# Patient Record
Sex: Female | Born: 1950
Health system: Southern US, Community
[De-identification: ages and names within clinical notes are randomized; demographics above are authoritative.]

## PROBLEM LIST (undated history)

## (undated) DIAGNOSIS — I10 Essential (primary) hypertension: Secondary | ICD-10-CM

## (undated) DIAGNOSIS — E785 Hyperlipidemia, unspecified: Secondary | ICD-10-CM

## (undated) DIAGNOSIS — F32A Depression, unspecified: Secondary | ICD-10-CM

## (undated) DIAGNOSIS — T7840XA Allergy, unspecified, initial encounter: Secondary | ICD-10-CM

## (undated) DIAGNOSIS — F329 Major depressive disorder, single episode, unspecified: Secondary | ICD-10-CM

## (undated) HISTORY — PX: BREAST CYST ASPIRATION: SHX578

## (undated) HISTORY — DX: Hyperlipidemia, unspecified: E78.5

## (undated) HISTORY — DX: Depression, unspecified: F32.A

## (undated) HISTORY — DX: Allergy, unspecified, initial encounter: T78.40XA

## (undated) HISTORY — DX: Major depressive disorder, single episode, unspecified: F32.9

## (undated) HISTORY — DX: Essential (primary) hypertension: I10

---

## 2005-08-27 ENCOUNTER — Ambulatory Visit: Payer: Self-pay | Admitting: Family Medicine

## 2005-12-23 ENCOUNTER — Ambulatory Visit: Payer: Self-pay | Admitting: Family Medicine

## 2006-09-13 HISTORY — PX: PLANTAR FASCIA SURGERY: SHX746

## 2007-01-30 ENCOUNTER — Ambulatory Visit: Payer: Self-pay | Admitting: Family Medicine

## 2008-08-07 ENCOUNTER — Ambulatory Visit: Payer: Self-pay | Admitting: Unknown Physician Specialty

## 2008-08-07 LAB — HM COLONOSCOPY

## 2010-05-08 ENCOUNTER — Other Ambulatory Visit: Payer: Self-pay | Admitting: Family Medicine

## 2010-06-25 ENCOUNTER — Ambulatory Visit: Payer: Self-pay | Admitting: Family Medicine

## 2011-08-24 ENCOUNTER — Encounter: Payer: Self-pay | Admitting: Family Medicine

## 2011-09-14 ENCOUNTER — Encounter: Payer: Self-pay | Admitting: Family Medicine

## 2011-09-20 ENCOUNTER — Ambulatory Visit: Payer: Self-pay | Admitting: Family Medicine

## 2011-10-15 ENCOUNTER — Encounter: Payer: Self-pay | Admitting: Family Medicine

## 2012-01-28 ENCOUNTER — Other Ambulatory Visit: Payer: Self-pay | Admitting: Psychiatry

## 2012-01-28 LAB — SGOT (AST)(ARMC): SGOT(AST): 22 U/L (ref 15–37)

## 2012-01-28 LAB — ELECTROLYTE PANEL
Anion Gap: 9 (ref 7–16)
Chloride: 101 mmol/L (ref 98–107)
Co2: 28 mmol/L (ref 21–32)
Potassium: 3.9 mmol/L (ref 3.5–5.1)

## 2012-01-28 LAB — T4, FREE: Free Thyroxine: 1.21 ng/dL (ref 0.76–1.46)

## 2012-01-28 LAB — TSH: Thyroid Stimulating Horm: 0.745 u[IU]/mL

## 2012-01-28 LAB — CBC WITH DIFFERENTIAL/PLATELET
Basophil #: 0 10*3/uL (ref 0.0–0.1)
Basophil %: 0.4 %
Eosinophil %: 1.7 %
HCT: 39.4 % (ref 35.0–47.0)
HGB: 13.2 g/dL (ref 12.0–16.0)
Lymphocyte #: 1.8 10*3/uL (ref 1.0–3.6)
MCH: 30.4 pg (ref 26.0–34.0)
Monocyte %: 6.1 %
Neutrophil %: 73.4 %
Platelet: 230 10*3/uL (ref 150–440)
RBC: 4.35 10*6/uL (ref 3.80–5.20)
RDW: 14 % (ref 11.5–14.5)

## 2012-01-28 LAB — CREATININE, SERUM: EGFR (African American): >60

## 2012-03-23 ENCOUNTER — Other Ambulatory Visit: Payer: Self-pay | Admitting: Psychiatry

## 2012-03-23 LAB — BASIC METABOLIC PANEL
Anion Gap: 9 (ref 7–16)
BUN: 18 mg/dL (ref 7–18)
Co2: 27 mmol/L (ref 21–32)
Creatinine: 0.74 mg/dL (ref 0.60–1.30)
EGFR (African American): >60
EGFR (Non-African Amer.): >60
Glucose: 100 mg/dL — ABNORMAL HIGH (ref 65–99)

## 2012-03-23 LAB — CBC
HCT: 38.7 % (ref 35.0–47.0)
HGB: 12.8 g/dL (ref 12.0–16.0)
MCH: 30.1 pg (ref 26.0–34.0)
Platelet: 233 10*3/uL (ref 150–440)
RBC: 4.25 10*6/uL (ref 3.80–5.20)
WBC: 9.9 10*3/uL (ref 3.6–11.0)

## 2012-03-23 LAB — FERRITIN: Ferritin (ARMC): 19 ng/mL (ref 8–388)

## 2012-03-23 LAB — ALT: SGPT (ALT): 30 U/L

## 2012-03-23 LAB — IRON: Iron: 74 ug/dL (ref 50–170)

## 2012-03-23 LAB — SGOT (AST)(ARMC): SGOT(AST): 19 U/L (ref 15–37)

## 2012-09-08 ENCOUNTER — Ambulatory Visit: Payer: Self-pay | Admitting: Psychiatry

## 2012-10-10 ENCOUNTER — Other Ambulatory Visit: Payer: Self-pay | Admitting: Family Medicine

## 2012-10-10 LAB — CBC WITH DIFFERENTIAL/PLATELET
Eosinophil #: 0.3 10*3/uL (ref 0.0–0.7)
Eosinophil %: 2.9 %
HCT: 40.9 % (ref 35.0–47.0)
HGB: 13.4 g/dL (ref 12.0–16.0)
MCHC: 32.8 g/dL (ref 32.0–36.0)
MCV: 90 fL (ref 80–100)
Monocyte %: 6.7 %
Neutrophil #: 6.4 10*3/uL (ref 1.4–6.5)
Platelet: 228 10*3/uL (ref 150–440)
RBC: 4.54 10*6/uL (ref 3.80–5.20)
RDW: 14 % (ref 11.5–14.5)
WBC: 9 10*3/uL (ref 3.6–11.0)

## 2012-10-10 LAB — COMPREHENSIVE METABOLIC PANEL
Alkaline Phosphatase: 117 U/L (ref 50–136)
Anion Gap: 10 (ref 7–16)
BUN: 21 mg/dL — ABNORMAL HIGH (ref 7–18)
Calcium, Total: 9.6 mg/dL (ref 8.5–10.1)
Creatinine: 0.79 mg/dL (ref 0.60–1.30)
EGFR (Non-African Amer.): >60
Glucose: 94 mg/dL (ref 65–99)
Osmolality: 278 (ref 275–301)
Potassium: 4.1 mmol/L (ref 3.5–5.1)
SGOT(AST): 26 U/L (ref 15–37)
SGPT (ALT): 30 U/L (ref 12–78)
Sodium: 138 mmol/L (ref 136–145)

## 2012-10-10 LAB — TSH: Thyroid Stimulating Horm: 0.674 u[IU]/mL

## 2012-10-10 LAB — CK: CK, Total: 66 U/L (ref 21–215)

## 2012-10-13 ENCOUNTER — Ambulatory Visit: Payer: Self-pay | Admitting: Psychiatry

## 2014-06-04 ENCOUNTER — Ambulatory Visit: Payer: Self-pay | Admitting: Family Medicine

## 2014-06-04 LAB — HM MAMMOGRAPHY

## 2014-08-28 LAB — TSH: TSH: 0.75 u[IU]/mL (ref 0.41–5.90)

## 2014-08-28 LAB — LIPID PANEL
Cholesterol: 161 mg/dL (ref 0–200)
HDL: 49 mg/dL (ref 35–70)
LDL Cholesterol: 86 mg/dL
Triglycerides: 131 mg/dL (ref 40–160)

## 2014-08-28 LAB — CBC AND DIFFERENTIAL
HCT: 42 % (ref 36–46)
Hemoglobin: 13.8 g/dL (ref 12.0–16.0)
PLATELETS: 259 10*3/uL (ref 150–399)
WBC: 8.8 10^3/mL

## 2014-08-28 LAB — BASIC METABOLIC PANEL
BUN: 15 mg/dL (ref 4–21)
CREATININE: 0.8 mg/dL (ref 0.5–1.1)
GLUCOSE: 106 mg/dL
Potassium: 4.6 mmol/L (ref 3.4–5.3)
SODIUM: 140 mmol/L (ref 137–147)

## 2014-08-28 LAB — HM PAP SMEAR: HM PAP: NEGATIVE

## 2014-08-28 LAB — HEPATIC FUNCTION PANEL
ALT: 23 U/L (ref 7–35)
AST: 21 U/L (ref 13–35)

## 2015-02-13 ENCOUNTER — Encounter: Payer: Self-pay | Admitting: Family Medicine

## 2015-02-13 ENCOUNTER — Ambulatory Visit (INDEPENDENT_AMBULATORY_CARE_PROVIDER_SITE_OTHER): Payer: BLUE CROSS/BLUE SHIELD | Admitting: Family Medicine

## 2015-02-13 VITALS — BP 128/74 | HR 72 | Temp 98.2°F | Resp 16 | Ht 62.0 in | Wt 245.0 lb

## 2015-02-13 DIAGNOSIS — R350 Frequency of micturition: Secondary | ICD-10-CM | POA: Insufficient documentation

## 2015-02-13 DIAGNOSIS — E669 Obesity, unspecified: Secondary | ICD-10-CM | POA: Insufficient documentation

## 2015-02-13 DIAGNOSIS — K649 Unspecified hemorrhoids: Secondary | ICD-10-CM | POA: Insufficient documentation

## 2015-02-13 DIAGNOSIS — J0100 Acute maxillary sinusitis, unspecified: Secondary | ICD-10-CM | POA: Diagnosis not present

## 2015-02-13 DIAGNOSIS — R059 Cough, unspecified: Secondary | ICD-10-CM

## 2015-02-13 DIAGNOSIS — I129 Hypertensive chronic kidney disease with stage 1 through stage 4 chronic kidney disease, or unspecified chronic kidney disease: Secondary | ICD-10-CM | POA: Insufficient documentation

## 2015-02-13 DIAGNOSIS — F325 Major depressive disorder, single episode, in full remission: Secondary | ICD-10-CM | POA: Insufficient documentation

## 2015-02-13 DIAGNOSIS — R05 Cough: Secondary | ICD-10-CM | POA: Diagnosis not present

## 2015-02-13 DIAGNOSIS — G4730 Sleep apnea, unspecified: Secondary | ICD-10-CM | POA: Insufficient documentation

## 2015-02-13 DIAGNOSIS — Z124 Encounter for screening for malignant neoplasm of cervix: Secondary | ICD-10-CM | POA: Insufficient documentation

## 2015-02-13 DIAGNOSIS — S46819A Strain of other muscles, fascia and tendons at shoulder and upper arm level, unspecified arm, initial encounter: Secondary | ICD-10-CM | POA: Insufficient documentation

## 2015-02-13 DIAGNOSIS — E7800 Pure hypercholesterolemia, unspecified: Secondary | ICD-10-CM | POA: Insufficient documentation

## 2015-02-13 DIAGNOSIS — B379 Candidiasis, unspecified: Secondary | ICD-10-CM | POA: Insufficient documentation

## 2015-02-13 DIAGNOSIS — E559 Vitamin D deficiency, unspecified: Secondary | ICD-10-CM | POA: Insufficient documentation

## 2015-02-13 DIAGNOSIS — M5430 Sciatica, unspecified side: Secondary | ICD-10-CM | POA: Insufficient documentation

## 2015-02-13 DIAGNOSIS — J309 Allergic rhinitis, unspecified: Secondary | ICD-10-CM | POA: Insufficient documentation

## 2015-02-13 MED ORDER — AMOXICILLIN-POT CLAVULANATE 875-125 MG PO TABS: 1.0000 | ORAL_TABLET | Freq: Two times a day (BID) | ORAL | Status: DC

## 2015-02-13 MED ORDER — FLUCONAZOLE 150 MG PO TABS: 150.0000 mg | ORAL_TABLET | Freq: Once | ORAL | Status: DC

## 2015-02-13 MED ORDER — FLUTICASONE PROPIONATE 50 MCG/ACT NA SUSP: 2.0000 | Freq: Every day | NASAL | Status: DC

## 2015-02-13 NOTE — Progress Notes (Signed)
   Subjective:    Patient ID: Anne Bell, female    DOB: 1951-04-24, 64 y.o.   MRN: [id number]  Cough This is a new problem. The current episode started in the past 7 days. The problem has been gradually worsening. The cough is productive of sputum. Associated symptoms include chills (Monday or Tuesday. ), nasal congestion and a sore throat. Pertinent negatives include no chest pain, ear congestion, ear pain, fever, headaches, postnasal drip, shortness of breath or wheezing. Eye redness: Productive, except at night. Delsym helps some.   The symptoms are aggravated by other. She has tried OTC cough suppressant for the symptoms. The treatment provided no relief.   Had symptoms early May for 2 weeks, then improved and now recurred over weekend.     Review of Systems  Constitutional: Positive for chills (Monday or Tuesday. ). Negative for fever.  HENT: Positive for sore throat. Negative for ear pain and postnasal drip.   Eyes: Eye redness: Productive, except at night. Delsym helps some.    Respiratory: Positive for cough. Negative for shortness of breath and wheezing.   Cardiovascular: Negative for chest pain.  Neurological: Negative for headaches.   Past Medical History  Diagnosis Date  . Allergy   . Hyperlipidemia   . Hypertension   . Depression    Past Surgical History  Procedure Laterality Date  . Plantar fascia surgery Left 2008    reports that she has never smoked. She has never used smokeless tobacco. She reports that she drinks alcohol. She reports that she does not use illicit drugs. family history includes Alcohol abuse in her father. Allergies  Allergen Reactions  . Celecoxib     stomach cramps  . Other   . Prednisone Other (See Comments)    No current outpatient prescriptions on file prior to visit.   No current facility-administered medications on file prior to visit.    Objective:   Physical Exam  Constitutional: She is oriented to person, place, and time. She  appears well-developed and well-nourished.  HENT:  Head: Normocephalic and atraumatic.  Right Ear: Tympanic membrane and external ear normal.  Left Ear: Tympanic membrane and external ear normal.  Nose: Mucosal edema and rhinorrhea present. Right sinus exhibits no maxillary sinus tenderness. Left sinus exhibits maxillary sinus tenderness.  Mouth/Throat: Uvula is midline. No oropharyngeal exudate.  Eyes: Conjunctivae and EOM are normal. Pupils are equal, round, and reactive to light.  Neck: Normal range of motion. Neck supple.  Cardiovascular: Normal rate and regular rhythm.   Pulmonary/Chest: Effort normal and breath sounds normal. She has no wheezes. She has no rales.  Neurological: She is alert and oriented to person, place, and time.   BP 128/74 mmHg  Pulse 72  Temp(Src) 98.2 F (36.8 C) (Oral)  Resp 16  Ht 5\' 2"  (1.575 m)  Wt 245 lb (111.131 kg)  BMI 44.80 kg/m2  SpO2 95%       Assessment & Plan:  1. Cough Worsening.  Will treat sinus infection.   2. Acute maxillary sinusitis, recurrence not specified Will start antibiotic,  Nasal steroid and diflucan. Please call back if condition worsens or does not continue to improve.

## 2015-02-13 NOTE — Progress Notes (Deleted)
Patient ID: Anne Bell, female   DOB: 05/19/1951, 64 y.o.   MRN: [id number]

## 2015-02-14 ENCOUNTER — Telehealth: Payer: Self-pay

## 2015-02-14 ENCOUNTER — Other Ambulatory Visit: Payer: Self-pay | Admitting: Family Medicine

## 2015-02-14 DIAGNOSIS — R059 Cough, unspecified: Secondary | ICD-10-CM

## 2015-02-14 DIAGNOSIS — R05 Cough: Secondary | ICD-10-CM

## 2015-02-14 MED ORDER — HYDROCODONE-HOMATROPINE 5-1.5 MG/5ML PO SYRP: 5.0000 mL | ORAL_SOLUTION | Freq: Four times a day (QID) | ORAL | Status: DC | PRN

## 2015-02-14 NOTE — Telephone Encounter (Signed)
Informed pt Hycodan prescription is ready to pick up. Allene DillonEmily Drozdowski, CMA

## 2015-02-14 NOTE — Telephone Encounter (Signed)
Pt would like to get an RX for cough medication Hydrocodone Homatropine. Thanks TNP

## 2015-02-20 ENCOUNTER — Other Ambulatory Visit: Payer: Self-pay

## 2015-02-20 DIAGNOSIS — I10 Essential (primary) hypertension: Secondary | ICD-10-CM

## 2015-02-20 DIAGNOSIS — J0100 Acute maxillary sinusitis, unspecified: Secondary | ICD-10-CM

## 2015-02-20 DIAGNOSIS — J309 Allergic rhinitis, unspecified: Secondary | ICD-10-CM

## 2015-02-20 DIAGNOSIS — E7800 Pure hypercholesterolemia, unspecified: Secondary | ICD-10-CM

## 2015-02-20 MED ORDER — METOPROLOL SUCCINATE ER 100 MG PO TB24
100.0000 mg | ORAL_TABLET | Freq: Every day | ORAL | Status: DC
Start: 2015-02-20 — End: 2016-02-24

## 2015-02-20 MED ORDER — FLUTICASONE PROPIONATE 50 MCG/ACT NA SUSP: 2.0000 | Freq: Every day | NASAL | Status: DC

## 2015-02-20 MED ORDER — AMLODIPINE BESYLATE 5 MG PO TABS: 5.0000 mg | ORAL_TABLET | Freq: Every day | ORAL | Status: DC

## 2015-02-20 MED ORDER — ATORVASTATIN CALCIUM 20 MG PO TABS: 20.0000 mg | ORAL_TABLET | Freq: Every day | ORAL | Status: DC

## 2015-02-20 MED ORDER — VALSARTAN-HYDROCHLOROTHIAZIDE 160-25 MG PO TABS: 1.0000 | ORAL_TABLET | Freq: Every day | ORAL | Status: DC

## 2015-03-10 ENCOUNTER — Other Ambulatory Visit: Payer: Self-pay | Admitting: Family Medicine

## 2015-03-10 DIAGNOSIS — I10 Essential (primary) hypertension: Secondary | ICD-10-CM

## 2015-05-30 ENCOUNTER — Ambulatory Visit (INDEPENDENT_AMBULATORY_CARE_PROVIDER_SITE_OTHER): Payer: Medicare Other | Admitting: Family Medicine

## 2015-05-30 ENCOUNTER — Telehealth: Payer: Self-pay | Admitting: Family Medicine

## 2015-05-30 ENCOUNTER — Encounter: Payer: Self-pay | Admitting: Family Medicine

## 2015-05-30 VITALS — BP 168/80 | HR 68 | Temp 98.2°F | Ht 62.2 in | Wt 246.4 lb

## 2015-05-30 DIAGNOSIS — Z0000 Encounter for general adult medical examination without abnormal findings: Secondary | ICD-10-CM | POA: Diagnosis not present

## 2015-05-30 DIAGNOSIS — E559 Vitamin D deficiency, unspecified: Secondary | ICD-10-CM

## 2015-05-30 DIAGNOSIS — Z1239 Encounter for other screening for malignant neoplasm of breast: Secondary | ICD-10-CM

## 2015-05-30 DIAGNOSIS — E7800 Pure hypercholesterolemia, unspecified: Secondary | ICD-10-CM

## 2015-05-30 DIAGNOSIS — I10 Essential (primary) hypertension: Secondary | ICD-10-CM

## 2015-05-30 DIAGNOSIS — Z23 Encounter for immunization: Secondary | ICD-10-CM

## 2015-05-30 DIAGNOSIS — E669 Obesity, unspecified: Secondary | ICD-10-CM

## 2015-05-30 DIAGNOSIS — N3281 Overactive bladder: Secondary | ICD-10-CM | POA: Insufficient documentation

## 2015-05-30 DIAGNOSIS — R29890 Loss of height: Secondary | ICD-10-CM

## 2015-05-30 DIAGNOSIS — Z13820 Encounter for screening for osteoporosis: Secondary | ICD-10-CM | POA: Diagnosis not present

## 2015-05-30 DIAGNOSIS — Z113 Encounter for screening for infections with a predominantly sexual mode of transmission: Secondary | ICD-10-CM

## 2015-05-30 MED ORDER — OXYBUTYNIN CHLORIDE 5 MG PO TABS: 5.0000 mg | ORAL_TABLET | Freq: Two times a day (BID) | ORAL | Status: DC

## 2015-05-30 NOTE — Assessment & Plan Note (Signed)
Was previously on medicine that helped. Will check bottle to see what it is and we will start it and see how she is doing at her physical in December.

## 2015-05-30 NOTE — Telephone Encounter (Signed)
Pt called stated she needs a refill on Oxybutynin. Pharm is [facility] on [address]. Thanks.

## 2015-05-30 NOTE — Telephone Encounter (Signed)
Forward to provider

## 2015-05-30 NOTE — Assessment & Plan Note (Signed)
Will recheck at PE

## 2015-05-30 NOTE — Progress Notes (Signed)
BP 168/80 mmHg  Pulse 68  Temp(Src) 98.2 F (36.8 C)  Ht 5' 2.2" (1.58 m)  Wt 246 lb 6.4 oz (111.766 kg)  BMI 44.77 kg/m2  SpO2 96%  LMP  (LMP Unknown)   Subjective:    Patient ID: [name], female    DOB: June 05, 1951, 64 y.o.   MRN: [id number]  HPI: MARIN WISNER is a 64 y.o. female  Chief Complaint  Patient presents with  . Establish Care   Wants her pneumovax, would like to get [facility] to [facility], going to get her flu and shingles shots there as well.   Overactive bladder, was on the medicine in the past- would like to go back on it.  Getting up ever hour to 2 hours to pee Not as restful sleep Has been cutting back liquids for 2 hours before bed, but still having issues.  Dysuria: no Urinary frequency: no Urgency: no Small volume voids: no Symptom severity: moderate Urinary incontinence: no Foul odor: no Hematuria: no Abdominal pain: no Back pain: yes- chronic  HYPERTENSION / HYPERLIPIDEMIA Satisfied with current treatment? yes Duration of hypertension: chronic BP monitoring frequency: not checking BP medication side effects: no Duration of hyperlipidemia: chronic Cholesterol medication side effects: no Cholesterol supplements: none Medication compliance: excellent compliance Aspirin: yes Recent stressors: no Recurrent headaches: no Visual changes: no Palpitations: no Dyspnea: no Chest pain: no Lower extremity edema: no Dizzy/lightheaded: yes- with changing position  Relevant past medical, surgical, family and social history reviewed and updated as indicated. Interim medical history since our last visit reviewed. Allergies and medications reviewed and updated.  Review of Systems  Constitutional: Negative.   Respiratory: Negative.   Cardiovascular: Negative.   Psychiatric/Behavioral: Negative.     Per HPI unless specifically indicated above     Objective:    BP 168/80 mmHg  Pulse 68  Temp(Src) 98.2 F (36.8 C)  Ht 5' 2.2" (1.58 m)  Wt  246 lb 6.4 oz (111.766 kg)  BMI 44.77 kg/m2  SpO2 96%  LMP  (LMP Unknown)  Wt Readings from Last 3 Encounters:  05/30/15 246 lb 6.4 oz (111.766 kg)  02/13/15 245 lb (111.131 kg)  08/28/14 245 lb (111.131 kg)    Physical Exam  Constitutional: She is oriented to person, place, and time. She appears well-developed and well-nourished. No distress.  HENT:  Head: Normocephalic and atraumatic.  Right Ear: Hearing normal.  Left Ear: Hearing normal.  Nose: Nose normal.  Eyes: Conjunctivae and lids are normal. Right eye exhibits no discharge. Left eye exhibits no discharge. No scleral icterus.  Cardiovascular: Normal rate, regular rhythm, normal heart sounds and intact distal pulses.  Exam reveals no gallop.   No murmur heard. Pulmonary/Chest: Effort normal and breath sounds normal. No respiratory distress. She has no wheezes. She has no rales. She exhibits no tenderness.  Musculoskeletal: Normal range of motion.  Neurological: She is alert and oriented to person, place, and time.  Skin: Skin is warm, dry and intact. No rash noted. No erythema. No pallor.  Psychiatric: She has a normal mood and affect. Her speech is normal and behavior is normal. Judgment and thought content normal. Cognition and memory are normal.  Nursing note and vitals reviewed.   Results for orders placed or performed in visit on 02/13/15  HM MAMMOGRAPHY  Result Value Ref Range   HM Mammogram BI-RADS 1   CBC and differential  Result Value Ref Range   Hemoglobin 13.8 12.0 - 16.0 g/dL   HCT  42 36 - 46 %   Platelets 259 150 - 399 K/L   WBC 8.8 10^3/mL  HM PAP SMEAR  Result Value Ref Range   HM Pap smear normal HPV-neg   Basic metabolic panel  Result Value Ref Range   Glucose 106 mg/dL   BUN 15 4 - 21 mg/dL   Creatinine 0.8 0.5 - 1.1 mg/dL   Potassium 4.6 3.4 - 5.3 mmol/L   Sodium 140 137 - 147 mmol/L  Lipid panel  Result Value Ref Range   Triglycerides 131 40 - 160 mg/dL   Cholesterol 161 0 - 096 mg/dL    HDL 49 35 - 70 mg/dL   LDL Cholesterol 86 mg/dL  Hepatic function panel  Result Value Ref Range   ALT 23 7 - 35 U/L   AST 21 13 - 35 U/L  TSH  Result Value Ref Range   TSH 0.75 0.41 - 5.90 uIU/mL  HM COLONOSCOPY  Result Value Ref Range   HM Colonoscopy      Diverticulosis,internal hemorrhoids; recheck in 10 years      Assessment & Plan:   Problem List Items Addressed This Visit      Cardiovascular and Mediastinum   BP (high blood pressure) - Primary    Not under good control today, but has been beautiful per previous records. Continue current regimen. Recheck at PE in December. Work on diet and exercise. DASH diet given today.       Relevant Orders   CBC With Differential/Platelet   Comprehensive metabolic panel   Microalbumin, Urine Waived   TSH     Genitourinary   Overactive bladder    Was previously on medicine that helped. Will check bottle to see what it is and we will start it and see how she is doing at her physical in December.       Relevant Orders   UA/M w/rflx Culture, Routine     Other   Hypercholesteremia    Under good control on last check. Continue current regimen. Recheck at PE in December.       Relevant Orders   Comprehensive metabolic panel   Lipid Panel Piccolo, Waived   Avitaminosis D    Will recheck at PE        Other Visit Diagnoses    Screening for breast cancer        Mammogram ordered today.     Relevant Orders    MM DIGITAL SCREENING BILATERAL    Screening for osteoporosis        DEXA ordered.     Relevant Orders    DG Bone Density    Loss of height        DEXA ordered.     Relevant Orders    DG Bone Density    TSH    Vit D  25 hydroxy (rtn osteoporosis monitoring)    Routine general medical examination at a health care facility        Relevant Orders    CBC With Differential/Platelet    Comprehensive metabolic panel    HIV antibody    Lipid Panel Piccolo, Waived    Microalbumin, Urine Waived    TSH    Hepatitis C  Antibody    UA/M w/rflx Culture, Routine    Vit D  25 hydroxy (rtn osteoporosis monitoring)    Vitamin D deficiency        Relevant Orders    Vit D  25 hydroxy (rtn osteoporosis monitoring)  Routine screening for STI (sexually transmitted infection)        Relevant Orders    HIV antibody    Hepatitis C Antibody    Obesity        Relevant Orders    TSH    Immunization due        Rx for flu and prevnar given as she would like to have them at [facility]        Follow up plan: Return December , for Physical.

## 2015-05-30 NOTE — Telephone Encounter (Signed)
Rx sent to her pharmacy 

## 2015-05-30 NOTE — Patient Instructions (Signed)
DASH Eating Plan °DASH stands for "Dietary Approaches to Stop Hypertension." The DASH eating plan is a healthy eating plan that has been shown to reduce high blood pressure (hypertension). Additional health benefits may include reducing the risk of type 2 diabetes mellitus, heart disease, and stroke. The DASH eating plan may also help with weight loss. °WHAT DO I NEED TO KNOW ABOUT THE DASH EATING PLAN? °For the DASH eating plan, you will follow these general guidelines: °· Choose foods with a percent daily value for sodium of less than 5% (as listed on the food label). °· Use salt-free seasonings or herbs instead of table salt or sea salt. °· Check with your health care provider or pharmacist before using salt substitutes. °· Eat lower-sodium products, often labeled as "lower sodium" or "no salt added." °· Eat fresh foods. °· Eat more vegetables, fruits, and low-fat dairy products. °· Choose whole grains. Look for the word "whole" as the first word in the ingredient list. °· Choose fish and skinless chicken or turkey more often than red meat. Limit fish, poultry, and meat to 6 oz (170 g) each day. °· Limit sweets, desserts, sugars, and sugary drinks. °· Choose heart-healthy fats. °· Limit cheese to 1 oz (28 g) per day. °· Eat more home-cooked food and less restaurant, buffet, and fast food. °· Limit fried foods. °· Cook foods using methods other than frying. °· Limit canned vegetables. If you do use them, rinse them well to decrease the sodium. °· When eating at a restaurant, ask that your food be prepared with less salt, or no salt if possible. °WHAT FOODS CAN I EAT? °Seek help from a dietitian for individual calorie needs. °Grains °Whole grain or whole wheat bread. Brown rice. Whole grain or whole wheat pasta. Quinoa, bulgur, and whole grain cereals. Low-sodium cereals. Corn or whole wheat flour tortillas. Whole grain cornbread. Whole grain crackers. Low-sodium crackers. °Vegetables °Fresh or frozen vegetables  (raw, steamed, roasted, or grilled). Low-sodium or reduced-sodium tomato and vegetable juices. Low-sodium or reduced-sodium tomato sauce and paste. Low-sodium or reduced-sodium canned vegetables.  °Fruits °All fresh, canned (in natural juice), or frozen fruits. °Meat and Other Protein Products °Ground beef (85% or leaner), grass-fed beef, or beef trimmed of fat. Skinless chicken or turkey. Ground chicken or turkey. Pork trimmed of fat. All fish and seafood. Eggs. Dried beans, peas, or lentils. Unsalted nuts and seeds. Unsalted canned beans. °Dairy °Low-fat dairy products, such as skim or 1% milk, 2% or reduced-fat cheeses, low-fat ricotta or cottage cheese, or plain low-fat yogurt. Low-sodium or reduced-sodium cheeses. °Fats and Oils °Tub margarines without trans fats. Light or reduced-fat mayonnaise and salad dressings (reduced sodium). Avocado. Safflower, olive, or canola oils. Natural peanut or almond butter. °Other °Unsalted popcorn and pretzels. °The items listed above may not be a complete list of recommended foods or beverages. Contact your dietitian for more options. °WHAT FOODS ARE NOT RECOMMENDED? °Grains °White bread. White pasta. White rice. Refined cornbread. Bagels and croissants. Crackers that contain trans fat. °Vegetables °Creamed or fried vegetables. Vegetables in a cheese sauce. Regular canned vegetables. Regular canned tomato sauce and paste. Regular tomato and vegetable juices. °Fruits °Dried fruits. Canned fruit in light or heavy syrup. Fruit juice. °Meat and Other Protein Products °Fatty cuts of meat. Ribs, chicken wings, bacon, sausage, bologna, salami, chitterlings, fatback, hot dogs, bratwurst, and packaged luncheon meats. Salted nuts and seeds. Canned beans with salt. °Dairy °Whole or 2% milk, cream, half-and-half, and cream cheese. Whole-fat or sweetened yogurt. Full-fat   cheeses or blue cheese. Nondairy creamers and whipped toppings. Processed cheese, cheese spreads, or cheese  curds. °Condiments °Onion and garlic salt, seasoned salt, table salt, and sea salt. Canned and packaged gravies. Worcestershire sauce. Tartar sauce. Barbecue sauce. Teriyaki sauce. Soy sauce, including reduced sodium. Steak sauce. Fish sauce. Oyster sauce. Cocktail sauce. Horseradish. Ketchup and mustard. Meat flavorings and tenderizers. Bouillon cubes. Hot sauce. Tabasco sauce. Marinades. Taco seasonings. Relishes. °Fats and Oils °Butter, stick margarine, lard, shortening, ghee, and bacon fat. Coconut, palm kernel, or palm oils. Regular salad dressings. °Other °Pickles and olives. Salted popcorn and pretzels. °The items listed above may not be a complete list of foods and beverages to avoid. Contact your dietitian for more information. °WHERE CAN I FIND MORE INFORMATION? °National [employer], [employer], [employer]: www.nhlbi.nih.gov/health/health-topics/topics/dash/ °Document Released: 08/19/2011 Document Revised: 01/14/2014 Document Reviewed: 07/04/2013 °[employer]® Patient Information ©2015 [employer]. This information is not intended to replace advice given to you by your health care provider. Make sure you discuss any questions you have with your health care provider. ° °

## 2015-05-30 NOTE — Assessment & Plan Note (Signed)
Under good control on last check. Continue current regimen. Recheck at PE in December.

## 2015-05-30 NOTE — Assessment & Plan Note (Signed)
Not under good control today, but has been beautiful per previous records. Continue current regimen. Recheck at PE in December. Work on diet and exercise. DASH diet given today.

## 2015-08-05 ENCOUNTER — Encounter: Payer: Self-pay | Admitting: Family Medicine

## 2015-08-05 ENCOUNTER — Ambulatory Visit
Admission: RE | Admit: 2015-08-05 | Discharge: 2015-08-05 | Disposition: A | Discharge status: Home / Self Care | Payer: Medicare Other | Source: Ambulatory Visit | Attending: Family Medicine | Admitting: Family Medicine

## 2015-08-05 ENCOUNTER — Other Ambulatory Visit: Payer: Self-pay | Admitting: Family Medicine

## 2015-08-05 DIAGNOSIS — Z13820 Encounter for screening for osteoporosis: Secondary | ICD-10-CM

## 2015-08-05 DIAGNOSIS — M85852 Other specified disorders of bone density and structure, left thigh: Secondary | ICD-10-CM | POA: Diagnosis not present

## 2015-08-05 DIAGNOSIS — M8580 Other specified disorders of bone density and structure, unspecified site: Secondary | ICD-10-CM | POA: Insufficient documentation

## 2015-08-05 DIAGNOSIS — R29890 Loss of height: Secondary | ICD-10-CM

## 2015-08-05 DIAGNOSIS — Z1239 Encounter for other screening for malignant neoplasm of breast: Secondary | ICD-10-CM

## 2015-08-05 DIAGNOSIS — Z1231 Encounter for screening mammogram for malignant neoplasm of breast: Secondary | ICD-10-CM | POA: Insufficient documentation

## 2015-08-21 ENCOUNTER — Other Ambulatory Visit: Payer: Self-pay | Admitting: Family Medicine

## 2015-08-21 ENCOUNTER — Ambulatory Visit (INDEPENDENT_AMBULATORY_CARE_PROVIDER_SITE_OTHER): Payer: Medicare Other | Admitting: Family Medicine

## 2015-08-21 ENCOUNTER — Encounter: Payer: Self-pay | Admitting: Family Medicine

## 2015-08-21 VITALS — BP 136/83 | HR 67 | Temp 98.4°F | Ht 61.5 in | Wt 243.0 lb

## 2015-08-21 DIAGNOSIS — E559 Vitamin D deficiency, unspecified: Secondary | ICD-10-CM

## 2015-08-21 DIAGNOSIS — N390 Urinary tract infection, site not specified: Secondary | ICD-10-CM | POA: Diagnosis not present

## 2015-08-21 DIAGNOSIS — Z136 Encounter for screening for cardiovascular disorders: Secondary | ICD-10-CM | POA: Diagnosis not present

## 2015-08-21 DIAGNOSIS — Z113 Encounter for screening for infections with a predominantly sexual mode of transmission: Secondary | ICD-10-CM | POA: Diagnosis not present

## 2015-08-21 DIAGNOSIS — I10 Essential (primary) hypertension: Secondary | ICD-10-CM

## 2015-08-21 DIAGNOSIS — G4733 Obstructive sleep apnea (adult) (pediatric): Secondary | ICD-10-CM | POA: Insufficient documentation

## 2015-08-21 DIAGNOSIS — R8281 Pyuria: Secondary | ICD-10-CM

## 2015-08-21 DIAGNOSIS — Z0000 Encounter for general adult medical examination without abnormal findings: Secondary | ICD-10-CM

## 2015-08-21 DIAGNOSIS — N3281 Overactive bladder: Secondary | ICD-10-CM

## 2015-08-21 DIAGNOSIS — E7800 Pure hypercholesterolemia, unspecified: Secondary | ICD-10-CM

## 2015-08-21 DIAGNOSIS — R29890 Loss of height: Secondary | ICD-10-CM

## 2015-08-21 DIAGNOSIS — E669 Obesity, unspecified: Secondary | ICD-10-CM

## 2015-08-21 LAB — MICROALBUMIN, URINE WAIVED
CREATININE, URINE WAIVED: 100 mg/dL (ref 10–300)
Microalb, Ur Waived: 30 mg/L — ABNORMAL HIGH (ref 0–19)

## 2015-08-21 LAB — CBC WITH DIFFERENTIAL/PLATELET
HEMOGLOBIN: 14.3 g/dL (ref 11.1–15.9)
Hematocrit: 41.8 % (ref 34.0–46.6)
Lymphocytes Absolute: 1.5 10*3/uL (ref 0.7–3.1)
Lymphs: 17 %
MCH: 32.1 pg (ref 26.6–33.0)
MCHC: 34.2 g/dL (ref 31.5–35.7)
MCV: 94 fL (ref 79–97)
MID (Absolute): 0.7 10*3/uL (ref 0.1–1.6)
MID: 8 %
Neutrophils Absolute: 6.7 10*3/uL (ref 1.4–7.0)
Neutrophils: 75 %
Platelets: 210 10*3/uL (ref 150–379)
RBC: 4.46 x10E6/uL (ref 3.77–5.28)
RDW: 13.4 % (ref 12.3–15.4)
WBC: 8.9 10*3/uL (ref 3.4–10.8)

## 2015-08-21 LAB — MICROSCOPIC EXAMINATION

## 2015-08-21 NOTE — Progress Notes (Signed)
BP 136/83 mmHg  Pulse 67  Temp(Src) 98.4 F (36.9 C)  Ht 5' 1.5" (1.562 m)  Wt 243 lb (110.224 kg)  BMI 45.18 kg/m2  SpO2 98%  LMP  (LMP Unknown)   Subjective:    Patient ID: Anne Bell, female    DOB: 1951/02/01, 64 y.o.   MRN: [id number]  HPI: FAIGY STRETCH is a 64 y.o. female presenting on 08/21/2015 for comprehensive medical examination. Current medical complaints include:  HYPERTENSION Hypertension status: uncontrolled  Satisfied with current treatment? yes Duration of hypertension: chronic BP monitoring frequency:  not checking BP medication side effects:  no Medication compliance: excellent compliance Aspirin: no Recurrent headaches: no Visual changes: no Palpitations: no Dyspnea: no Chest pain: no Lower extremity edema: no Dizzy/lightheaded: yes  She currently lives with: alone Menopausal Symptoms: yes  Depression Screen done today and results listed below:  Depression screen [facility] 2/9 08/21/2015 05/30/2015  Decreased Interest 0 0  Down, Depressed, Hopeless 0 0  PHQ - 2 Score 0 0  Altered sleeping 2 -  Tired, decreased energy 0 -  Change in appetite 0 -  Feeling bad or failure about yourself  0 -  Trouble concentrating 0 -  Moving slowly or fidgety/restless 0 -  Suicidal thoughts 0 -  PHQ-9 Score 2 -  Difficult doing work/chores Not difficult at all -   Other Providers:  [facility]- Eyes  The patient does not have a history of falls. I did not complete a risk assessment for falls. A plan of care for falls was not documented.  Past Medical History:  Past Medical History  Diagnosis Date  . Allergy   . Hyperlipidemia   . Hypertension   . Depression     Surgical History:  Past Surgical History  Procedure Laterality Date  . Plantar fascia surgery Left 2008  . Breast biopsy Left     neg    Medications:  Current Outpatient Prescriptions on File Prior to Visit  Medication Sig  . amLODipine (NORVASC) 5 MG tablet Take 1 tablet (5 mg  total) by mouth daily.  Marland Kitchen atorvastatin (LIPITOR) 20 MG tablet Take 1 tablet (20 mg total) by mouth daily.  . cyclobenzaprine (FLEXERIL) 5 MG tablet Take by mouth.  . fluticasone (FLONASE) 50 MCG/ACT nasal spray Place 2 sprays into both nostrils daily.  . Lutein 6 MG CAPS Take 6 mg by mouth daily.   . metoprolol succinate (TOPROL-XL) 100 MG 24 hr tablet Take 1 tablet (100 mg total) by mouth daily.  . MULTIPLE VITAMIN PO Take 1 tablet by mouth daily.   . naproxen (NAPROSYN) 500 MG tablet Take 500 mg by mouth daily as needed.   Marland Kitchen oxybutynin (DITROPAN) 5 MG tablet Take 1 tablet (5 mg total) by mouth 2 (two) times daily.  . valsartan-hydrochlorothiazide (DIOVAN-HCT) 160-25 MG per tablet take 1 tablet by mouth once daily   No current facility-administered medications on file prior to visit.    Allergies:  Allergies  Allergen Reactions  . Celecoxib     stomach cramps  . Other   . Prednisone Other (See Comments)   Social History:  Social History   Social History  . Marital Status: Divorced    Spouse Name: N/A  . Number of Children: N/A  . Years of Education: N/A   Occupational History  . Retired    Social History Main Topics  . Smoking status: Never Smoker   . Smokeless tobacco: Never Used  . Alcohol Use: Yes  Comment: occasional  . Drug Use: No  . Sexual Activity: Yes    Birth Control/ Protection: None   Other Topics Concern  . Not on file   Social History Narrative   History  Smoking status  . Never Smoker   Smokeless tobacco  . Never Used   History  Alcohol Use  . Yes    Comment: occasional   Family History:  Family History  Problem Relation Age of Onset  . Alcohol abuse Father   . Breast cancer Neg Hx   . Mental illness Mother     Depression  . Cancer Mother    Past medical history, surgical history, medications, allergies, family history and social history reviewed with patient today and changes made to appropriate areas of the chart. Also see  scanned welcome to medicare paperwork.  Review of Systems  Constitutional: Negative.   HENT: Negative for congestion, ear discharge, ear pain, hearing loss, nosebleeds, sore throat and tinnitus.   Eyes: Negative.   Respiratory: Negative.  Negative for stridor.   Cardiovascular: Negative.   Gastrointestinal: Positive for heartburn (goes away with tums). Negative for nausea, vomiting, abdominal pain, diarrhea, constipation, blood in stool and melena.  Genitourinary: Negative.        Under better control with the oxybutinin when she takes it   Musculoskeletal: Negative.   Skin: Negative.        Got some ant bait on her finger and had the skin taken off a couple of months ago. Heals and then breaks open again. Has been going on for several months. Has been very irritating  Neurological: Positive for dizziness (occasionally with going from sitting to standing) and headaches (last couple of days she has woken up with it). Negative for tingling, tremors, sensory change, speech change, focal weakness, seizures and loss of consciousness.  Endo/Heme/Allergies: Negative.   Psychiatric/Behavioral: Negative.     All other ROS negative except what is listed above and in the HPI.      Objective:    BP 136/83 mmHg  Pulse 67  Temp(Src) 98.4 F (36.9 C)  Ht 5' 1.5" (1.562 m)  Wt 243 lb (110.224 kg)  BMI 45.18 kg/m2  SpO2 98%  LMP  (LMP Unknown)  Wt Readings from Last 3 Encounters:  08/21/15 243 lb (110.224 kg)  05/30/15 246 lb 6.4 oz (111.766 kg)  02/13/15 245 lb (111.131 kg)    Physical Exam  Constitutional: She is oriented to person, place, and time. She appears well-developed and well-nourished. No distress.  Obese   HENT:  Head: Normocephalic and atraumatic.  Right Ear: Hearing, tympanic membrane, external ear and ear canal normal.  Left Ear: Hearing, tympanic membrane, external ear and ear canal normal.  Nose: Nose normal.  Mouth/Throat: Uvula is midline, oropharynx is clear and  moist and mucous membranes are normal. No oropharyngeal exudate.  Eyes: Conjunctivae and lids are normal. Pupils are equal, round, and reactive to light. Right eye exhibits no discharge. Left eye exhibits no discharge. No scleral icterus.  Neck: Normal range of motion. Neck supple. No JVD present. No tracheal deviation present. No thyromegaly present.  Cardiovascular: Normal rate, regular rhythm, normal heart sounds and intact distal pulses.  Exam reveals no gallop and no friction rub.   No murmur heard. Pulmonary/Chest: Effort normal and breath sounds normal. No stridor. No respiratory distress. She has no wheezes. She has no rales. She exhibits no tenderness. Right breast exhibits no inverted nipple, no mass, no nipple discharge, no skin change  and no tenderness. Left breast exhibits no inverted nipple, no mass, no nipple discharge, no skin change and no tenderness. Breasts are symmetrical.  Abdominal: Soft. Bowel sounds are normal. She exhibits no distension and no mass. There is no tenderness. There is no rebound and no guarding.  Genitourinary:  Deferred with shared decision  making  Musculoskeletal: Normal range of motion. She exhibits edema (trace edema bilaterally). She exhibits no tenderness.  Lymphadenopathy:    She has no cervical adenopathy.  Neurological: She is alert and oriented to person, place, and time. She has normal reflexes. She displays normal reflexes. No cranial nerve deficit. She exhibits normal muscle tone. Coordination normal.  Skin: Skin is warm, dry and intact. No rash noted. She is not diaphoretic. No erythema. No pallor.  Psychiatric: She has a normal mood and affect. Her speech is normal and behavior is normal. Judgment and thought content normal. Cognition and memory are normal.  Nursing note and vitals reviewed.   Results for orders placed or performed in visit on 08/21/15  Microscopic Examination  Result Value Ref Range   WBC, UA 0-5 0 -  5 /hpf   RBC, UA 0-2  0 -  2 /hpf   Epithelial Cells (non renal) 0-10 0 - 10 /hpf   Bacteria, UA Few None seen/Few  Lipid Panel w/o Chol/HDL Ratio  Result Value Ref Range   Cholesterol, Total 176 100 - 199 mg/dL   Triglycerides 401194 (H) 0 - 149 mg/dL   HDL 50 >02>39 mg/dL   VLDL Cholesterol Cal 39 5 - 40 mg/dL   LDL Calculated 87 0 - 99 mg/dL  CBC With Differential/Platelet  Result Value Ref Range   WBC 8.9 3.4 - 10.8 x10E3/uL   RBC 4.46 3.77 - 5.28 x10E6/uL   Hemoglobin 14.3 11.1 - 15.9 g/dL   Hematocrit 72.541.8 36.634.0 - 46.6 %   MCV 94 79 - 97 fL   MCH 32.1 26.6 - 33.0 pg   MCHC 34.2 31.5 - 35.7 g/dL   RDW 44.013.4 34.712.3 - 42.515.4 %   Platelets 210 150 - 379 x10E3/uL   Neutrophils 75 %   Lymphs 17 %   MID 8 %   Neutrophils Absolute 6.7 1.4 - 7.0 x10E3/uL   Lymphocytes Absolute 1.5 0.7 - 3.1 x10E3/uL   MID (Absolute) 0.7 0.1 - 1.6 X10E3/uL  UA/M w/rflx Culture, Routine  Result Value Ref Range   Urine Culture, Routine Final report    Urine Culture result 1 Comment   Microalbumin, Urine Waived  Result Value Ref Range   Microalb, Ur Waived 30 (H) 0 - 19 mg/L   Creatinine, Urine Waived 100 10 - 300 mg/dL   Microalb/Creat Ratio <30 <30 mg/g  Comprehensive metabolic panel  Result Value Ref Range   Glucose 101 (H) 65 - 99 mg/dL   BUN 17 8 - 27 mg/dL   Creatinine, Ser 9.560.61 0.57 - 1.00 mg/dL   GFR calc non Af Amer 95 >59 mL/min/1.73   GFR calc Af Amer 110 >59 mL/min/1.73   BUN/Creatinine Ratio 28 (H) 11 - 26   Sodium 139 136 - 144 mmol/L   Potassium 4.1 3.5 - 5.2 mmol/L   Chloride 97 97 - 106 mmol/L   CO2 26 18 - 29 mmol/L   Calcium 10.5 (H) 8.7 - 10.3 mg/dL   Total Protein 7.3 6.0 - 8.5 g/dL   Albumin 4.5 3.6 - 4.8 g/dL   Globulin, Total 2.8 1.5 - 4.5 g/dL   Albumin/Globulin Ratio 1.6  1.1 - 2.5   Bilirubin Total 0.6 0.0 - 1.2 mg/dL   Alkaline Phosphatase 96 39 - 117 IU/L   AST 23 0 - 40 IU/L   ALT 24 0 - 32 IU/L  HIV antibody  Result Value Ref Range   HIV Screen 4th Generation wRfx Non Reactive Non  Reactive  TSH  Result Value Ref Range   TSH 0.794 0.450 - 4.500 uIU/mL  Hepatitis C antibody  Result Value Ref Range   Hep C Virus Ab <0.1 0.0 - 0.9 s/co ratio  VITAMIN D 25 Hydroxy (Vit-D Deficiency, Fractures)  Result Value Ref Range   Vit D, 25-Hydroxy 28.9 (L) 30.0 - 100.0 ng/mL      Assessment & Plan:   Problem List Items Addressed This Visit      Cardiovascular and Mediastinum   BP (high blood pressure)    Not under good control. Will continue to monitor and will check in 1 month, if still elevated will add medication.         Respiratory   OSA (obstructive sleep apnea)    Not under good control, possibly causing BP to be elevated. Will refer to ENT for further eval and additional options.       Relevant Orders   Ambulatory referral to ENT     Other   Avitaminosis D    Checking labs today.       Other Visit Diagnoses    Welcome to Medicare preventive visit    -  Primary    See below for list of prevenative care discussed today. Copy also given to patient. Advance directive information given today.     Routine screening for STI (sexually transmitted infection)        Checking labs today. Await results.     Encounter for screening for cardiovascular disorders        EKG checked today was normal. Lipids checked today as well.     Relevant Orders    EKG 12-Lead (Completed)    Lipid Panel w/o Chol/HDL Ratio (Completed)        Follow up plan: Return in about 4 weeks (around 09/18/2015) for BP check.  Preventative Services:  AAA screening: Not applicable Health Risk Assessment and Personalized Prevention Plan: Bone Mass Measurements: done 08/05/15- not due again for 5 years Breast Cancer Screening: done 08/05/15 due next year or the year after CVD Screening: lipids checked today. EKG done today Cervical Cancer Screening: done last year, does not need another Colon Cancer Screening: due in 2019 Depression Screening: done today Diabetes Screening: done  today Glaucoma Screening: Done through the eye doctor Hepatitis B vaccine: Not applicable Hepatitis C screening: to be done today HIV Screening: to be done today Flu Vaccine: done 05/31/15 Lung cancer Screening: Not applicable Obesity Screening: Done today Pneumonia Vaccines (2): Prevnar done 05/30/14, pneumovax next year STI Screening: Not applicable  LABORATORY TESTING:  - Pap smear: up to date  IMMUNIZATIONS:   - Tdap: Tetanus vaccination status reviewed: last tetanus booster within 10 years. - Influenza: Up to date - Pneumovax: Due next year - Prevnar: Up to date - Zostavax vaccine: Up to date  SCREENING: -Mammogram: Up to date  - Colonoscopy: Up to date  - Bone Density: Up to date  -Hearing Test: Ordered today   PATIENT COUNSELING:   Advised to take 1 mg of folate supplement per day if capable of pregnancy.   Sexuality: Discussed sexually transmitted diseases, partner selection, use of condoms, avoidance of  unintended pregnancy  and contraceptive alternatives.   Advised to avoid cigarette smoking.  I discussed with the patient that most people either abstain from alcohol or drink within safe limits (<=14/week and <=4 drinks/occasion for males, <=7/weeks and <= 3 drinks/occasion for females) and that the risk for alcohol disorders and other health effects rises proportionally with the number of drinks per week and how often a drinker exceeds daily limits.  Discussed cessation/primary prevention of drug use and availability of treatment for abuse.   Diet: Encouraged to adjust caloric intake to maintain  or achieve ideal body weight, to reduce intake of dietary saturated fat and total fat, to limit sodium intake by avoiding high sodium foods and not adding table salt, and to maintain adequate dietary potassium and calcium preferably from fresh fruits, vegetables, and low-fat dairy products.    stressed the importance of regular exercise  Injury prevention: Discussed safety  belts, safety helmets, smoke detector, smoking near bedding or upholstery.   Dental health: Discussed importance of regular tooth brushing, flossing, and dental visits.    NEXT PREVENTATIVE PHYSICAL DUE IN 1 YEAR. Return in about 4 weeks (around 09/18/2015) for BP check.

## 2015-08-21 NOTE — Patient Instructions (Addendum)
Preventative Services:  AAA screening: Not applicable Health Risk Assessment and Personalized Prevention Plan: Bone Mass Measurements: done 08/05/15- not due again for 5 years Breast Cancer Screening: done 08/05/15 due next year or the year after CVD Screening: lipids checked today. EKG done today Cervical Cancer Screening: done last year, does not need another Colon Cancer Screening: due in 2019 Depression Screening: done today Diabetes Screening: done today Glaucoma Screening: Done through the eye doctor Hepatitis B vaccine: Not applicable Hepatitis C screening: to be done today HIV Screening: to be done today Flu Vaccine: done 05/31/15 Lung cancer Screening: Not applicable Obesity Screening: Done today Pneumonia Vaccines (2): Prevnar done 05/30/14, pneumovax next year STI Screening: Not applicable  Menopause is a normal process in which your reproductive ability comes to an end. This process happens gradually over a span of months to years, usually between the ages of 70 and 21. Menopause is complete when you have missed 12 consecutive menstrual periods. It is important to talk with your health care provider about some of the most common conditions that affect postmenopausal women, such as heart disease, cancer, and bone loss (osteoporosis). Adopting a healthy lifestyle and getting preventive care can help to promote your health and wellness. Those actions can also lower your chances of developing some of these common conditions. WHAT SHOULD I KNOW ABOUT MENOPAUSE? During menopause, you may experience a number of symptoms, such as:  Moderate-to-severe hot flashes.  Night sweats.  Decrease in sex drive.  Mood swings.  Headaches.  Tiredness.  Irritability.  Memory problems.  Insomnia. Choosing to treat or not to treat menopausal changes is an individual decision that you make with your health care provider. WHAT SHOULD I KNOW ABOUT HORMONE REPLACEMENT THERAPY AND  SUPPLEMENTS? Hormone therapy products are effective for treating symptoms that are associated with menopause, such as hot flashes and night sweats. Hormone replacement carries certain risks, especially as you become older. If you are thinking about using estrogen or estrogen with progestin treatments, discuss the benefits and risks with your health care provider. WHAT SHOULD I KNOW ABOUT HEART DISEASE AND STROKE? Heart disease, heart attack, and stroke become more likely as you age. This may be due, in part, to the hormonal changes that your body experiences during menopause. These can affect how your body processes dietary fats, triglycerides, and cholesterol. Heart attack and stroke are both medical emergencies. There are many things that you can do to help prevent heart disease and stroke:  Have your blood pressure checked at least every 1-2 years. High blood pressure causes heart disease and increases the risk of stroke.  If you are 89-30 years old, ask your health care provider if you should take aspirin to prevent a heart attack or a stroke.  Do not use any tobacco products, including cigarettes, chewing tobacco, or electronic cigarettes. If you need help quitting, ask your health care provider.  It is important to eat a healthy diet and maintain a healthy weight.  Be sure to include plenty of vegetables, fruits, low-fat dairy products, and lean protein.  Avoid eating foods that are high in solid fats, added sugars, or salt (sodium).  Get regular exercise. This is one of the most important things that you can do for your health.  Try to exercise for at least 150 minutes each week. The type of exercise that you do should increase your heart rate and make you sweat. This is known as moderate-intensity exercise.  Try to do strengthening exercises at least  twice each week. Do these in addition to the moderate-intensity exercise.  Know your numbers.Ask your health care provider to check  your cholesterol and your blood glucose. Continue to have your blood tested as directed by your health care provider. WHAT SHOULD I KNOW ABOUT CANCER SCREENING? There are several types of cancer. Take the following steps to reduce your risk and to catch any cancer development as early as possible. Breast Cancer  Practice breast self-awareness.  This means understanding how your breasts normally appear and feel.  It also means doing regular breast self-exams. Let your health care provider know about any changes, no matter how small.  If you are 23 or older, have a clinician do a breast exam (clinical breast exam or CBE) every year. Depending on your age, family history, and medical history, it may be recommended that you also have a yearly breast X-ray (mammogram).  If you have a family history of breast cancer, talk with your health care provider about genetic screening.  If you are at high risk for breast cancer, talk with your health care provider about having an MRI and a mammogram every year.  Breast cancer (BRCA) gene test is recommended for women who have family members with BRCA-related cancers. Results of the assessment will determine the need for genetic counseling and BRCA1 and for BRCA2 testing. BRCA-related cancers include these types:  Breast. This occurs in males or females.  Ovarian.  Tubal. This may also be called fallopian tube cancer.  Cancer of the abdominal or pelvic lining (peritoneal cancer).  Prostate.  Pancreatic. Cervical, Uterine, and Ovarian Cancer Your health care provider may recommend that you be screened regularly for cancer of the pelvic organs. These include your ovaries, uterus, and vagina. This screening involves a pelvic exam, which includes checking for microscopic changes to the surface of your cervix (Pap test).  For women ages 21-65, health care providers may recommend a pelvic exam and a Pap test every three years. For women ages 58-65, they  may recommend the Pap test and pelvic exam, combined with testing for human papilloma virus (HPV), every five years. Some types of HPV increase your risk of cervical cancer. Testing for HPV may also be done on women of any age who have unclear Pap test results.  Other health care providers may not recommend any screening for nonpregnant women who are considered low risk for pelvic cancer and have no symptoms. Ask your health care provider if a screening pelvic exam is right for you.  If you have had past treatment for cervical cancer or a condition that could lead to cancer, you need Pap tests and screening for cancer for at least 20 years after your treatment. If Pap tests have been discontinued for you, your risk factors (such as having a new sexual partner) need to be reassessed to determine if you should start having screenings again. Some women have medical problems that increase the chance of getting cervical cancer. In these cases, your health care provider may recommend that you have screening and Pap tests more often.  If you have a family history of uterine cancer or ovarian cancer, talk with your health care provider about genetic screening.  If you have vaginal bleeding after reaching menopause, tell your health care provider.  There are currently no reliable tests available to screen for ovarian cancer. Lung Cancer Lung cancer screening is recommended for adults 4-35 years old who are at high risk for lung cancer because of a history  of smoking. A yearly low-dose CT scan of the lungs is recommended if you:  Currently smoke.  Have a history of at least 30 pack-years of smoking and you currently smoke or have quit within the past 15 years. A pack-year is smoking an average of one pack of cigarettes per day for one year. Yearly screening should:  Continue until it has been 15 years since you quit.  Stop if you develop a health problem that would prevent you from having lung cancer  treatment. Colorectal Cancer  This type of cancer can be detected and can often be prevented.  Routine colorectal cancer screening usually begins at age 39 and continues through age 74.  If you have risk factors for colon cancer, your health care provider may recommend that you be screened at an earlier age.  If you have a family history of colorectal cancer, talk with your health care provider about genetic screening.  Your health care provider may also recommend using home test kits to check for hidden blood in your stool.  A small camera at the end of a tube can be used to examine your colon directly (sigmoidoscopy or colonoscopy). This is done to check for the earliest forms of colorectal cancer.  Direct examination of the colon should be repeated every 5-10 years until age 70. However, if early forms of precancerous polyps or small growths are found or if you have a family history or genetic risk for colorectal cancer, you may need to be screened more often. Skin Cancer  Check your skin from head to toe regularly.  Monitor any moles. Be sure to tell your health care provider:  About any new moles or changes in moles, especially if there is a change in a mole's shape or color.  If you have a mole that is larger than the size of a pencil eraser.  If any of your family members has a history of skin cancer, especially at a young age, talk with your health care provider about genetic screening.  Always use sunscreen. Apply sunscreen liberally and repeatedly throughout the day.  Whenever you are outside, protect yourself by wearing long sleeves, pants, a wide-brimmed hat, and sunglasses. WHAT SHOULD I KNOW ABOUT OSTEOPOROSIS? Osteoporosis is a condition in which bone destruction happens more quickly than new bone creation. After menopause, you may be at an increased risk for osteoporosis. To help prevent osteoporosis or the bone fractures that can happen because of osteoporosis, the  following is recommended:  If you are 12-5 years old, get at least 1,000 mg of calcium and at least 600 mg of vitamin D per day.  If you are older than age 21 but younger than age 19, get at least 1,200 mg of calcium and at least 600 mg of vitamin D per day.  If you are older than age 12, get at least 1,200 mg of calcium and at least 800 mg of vitamin D per day. Smoking and excessive alcohol intake increase the risk of osteoporosis. Eat foods that are rich in calcium and vitamin D, and do weight-bearing exercises several times each week as directed by your health care provider. WHAT SHOULD I KNOW ABOUT HOW MENOPAUSE AFFECTS [place]? Depression may occur at any age, but it is more common as you become older. Common symptoms of depression include:  Low or sad mood.  Changes in sleep patterns.  Changes in appetite or eating patterns.  Feeling an overall lack of motivation or enjoyment of  activities that you previously enjoyed.  Frequent crying spells. Talk with your health care provider if you think that you are experiencing depression. WHAT SHOULD I KNOW ABOUT IMMUNIZATIONS? It is important that you get and maintain your immunizations. These include:  Tetanus, diphtheria, and pertussis (Tdap) booster vaccine.  Influenza every year before the flu season begins.  Pneumonia vaccine.  Shingles vaccine. Your health care provider may also recommend other immunizations.   This information is not intended to replace advice given to you by your health care provider. Make sure you discuss any questions you have with your health care provider.   Document Released: 10/22/2005 Document Revised: 09/20/2014 Document Reviewed: 05/02/2014 Elsevier Interactive Patient Education [employer].

## 2015-08-22 ENCOUNTER — Telehealth: Payer: Self-pay | Admitting: Family Medicine

## 2015-08-22 ENCOUNTER — Encounter: Payer: Self-pay | Admitting: Family Medicine

## 2015-08-22 LAB — COMPREHENSIVE METABOLIC PANEL
ALBUMIN: 4.5 g/dL (ref 3.6–4.8)
ALT: 24 IU/L (ref 0–32)
AST: 23 IU/L (ref 0–40)
Albumin/Globulin Ratio: 1.6 (ref 1.1–2.5)
Alkaline Phosphatase: 96 IU/L (ref 39–117)
BILIRUBIN TOTAL: 0.6 mg/dL (ref 0.0–1.2)
BUN / CREAT RATIO: 28 — AB (ref 11–26)
BUN: 17 mg/dL (ref 8–27)
CALCIUM: 10.5 mg/dL — AB (ref 8.7–10.3)
CO2: 26 mmol/L (ref 18–29)
CREATININE: 0.61 mg/dL (ref 0.57–1.00)
Chloride: 97 mmol/L (ref 97–106)
GFR, EST AFRICAN AMERICAN: 110 mL/min/{1.73_m2} (ref >59)
GFR, EST NON AFRICAN AMERICAN: 95 mL/min/{1.73_m2} (ref >59)
GLUCOSE: 101 mg/dL — AB (ref 65–99)
Globulin, Total: 2.8 g/dL (ref 1.5–4.5)
Potassium: 4.1 mmol/L (ref 3.5–5.2)
Sodium: 139 mmol/L (ref 136–144)
TOTAL PROTEIN: 7.3 g/dL (ref 6.0–8.5)

## 2015-08-22 LAB — UA/M W/RFLX CULTURE, ROUTINE

## 2015-08-22 LAB — LIPID PANEL W/O CHOL/HDL RATIO
Cholesterol, Total: 176 mg/dL (ref 100–199)
HDL: 50 mg/dL (ref >39)
LDL Calculated: 87 mg/dL (ref 0–99)
TRIGLYCERIDES: 194 mg/dL — AB (ref 0–149)
VLDL Cholesterol Cal: 39 mg/dL (ref 5–40)

## 2015-08-22 LAB — HEPATITIS C ANTIBODY

## 2015-08-22 LAB — HIV ANTIBODY (ROUTINE TESTING W REFLEX): HIV Screen 4th Generation wRfx: NONREACTIVE

## 2015-08-22 LAB — TSH: TSH: 0.794 u[IU]/mL (ref 0.450–4.500)

## 2015-08-22 LAB — VITAMIN D 25 HYDROXY (VIT D DEFICIENCY, FRACTURES): VIT D 25 HYDROXY: 28.9 ng/mL — AB (ref 30.0–100.0)

## 2015-08-22 NOTE — Telephone Encounter (Signed)
Pt would like Burn cream to be sent to [facility] [address]

## 2015-08-25 MED ORDER — SILVER SULFADIAZINE 1 % EX CREA: 1.0000 "application " | TOPICAL_CREAM | Freq: Every day | CUTANEOUS | Status: DC

## 2015-08-25 NOTE — Assessment & Plan Note (Signed)
Not under good control. Will continue to monitor and will check in 1 month, if still elevated will add medication.

## 2015-08-25 NOTE — Assessment & Plan Note (Signed)
Not under good control, possibly causing BP to be elevated. Will refer to ENT for further eval and additional options.

## 2015-08-25 NOTE — Assessment & Plan Note (Signed)
Checking labs today

## 2015-08-25 NOTE — Telephone Encounter (Signed)
Forward to provider

## 2015-08-25 NOTE — Telephone Encounter (Signed)
Rx sent to her pharmacy 

## 2015-10-02 ENCOUNTER — Other Ambulatory Visit: Payer: Self-pay | Admitting: Family Medicine

## 2015-10-02 DIAGNOSIS — J309 Allergic rhinitis, unspecified: Secondary | ICD-10-CM

## 2015-10-02 DIAGNOSIS — G4733 Obstructive sleep apnea (adult) (pediatric): Secondary | ICD-10-CM | POA: Diagnosis not present

## 2016-02-19 DIAGNOSIS — M5442 Lumbago with sciatica, left side: Secondary | ICD-10-CM | POA: Diagnosis not present

## 2016-02-19 DIAGNOSIS — S336XXA Sprain of sacroiliac joint, initial encounter: Secondary | ICD-10-CM | POA: Diagnosis not present

## 2016-02-19 DIAGNOSIS — M9904 Segmental and somatic dysfunction of sacral region: Secondary | ICD-10-CM | POA: Diagnosis not present

## 2016-02-19 DIAGNOSIS — M9903 Segmental and somatic dysfunction of lumbar region: Secondary | ICD-10-CM | POA: Diagnosis not present

## 2016-02-23 DIAGNOSIS — M5442 Lumbago with sciatica, left side: Secondary | ICD-10-CM | POA: Diagnosis not present

## 2016-02-23 DIAGNOSIS — M9903 Segmental and somatic dysfunction of lumbar region: Secondary | ICD-10-CM | POA: Diagnosis not present

## 2016-02-23 DIAGNOSIS — S336XXA Sprain of sacroiliac joint, initial encounter: Secondary | ICD-10-CM | POA: Diagnosis not present

## 2016-02-23 DIAGNOSIS — M9904 Segmental and somatic dysfunction of sacral region: Secondary | ICD-10-CM | POA: Diagnosis not present

## 2016-02-24 ENCOUNTER — Encounter: Payer: Self-pay | Admitting: Family Medicine

## 2016-02-24 ENCOUNTER — Ambulatory Visit (INDEPENDENT_AMBULATORY_CARE_PROVIDER_SITE_OTHER): Payer: Medicare Other | Admitting: Family Medicine

## 2016-02-24 VITALS — BP 133/83 | HR 59 | Temp 98.4°F | Wt 241.0 lb

## 2016-02-24 DIAGNOSIS — Z23 Encounter for immunization: Secondary | ICD-10-CM | POA: Diagnosis not present

## 2016-02-24 DIAGNOSIS — I129 Hypertensive chronic kidney disease with stage 1 through stage 4 chronic kidney disease, or unspecified chronic kidney disease: Secondary | ICD-10-CM

## 2016-02-24 DIAGNOSIS — E7800 Pure hypercholesterolemia, unspecified: Secondary | ICD-10-CM | POA: Diagnosis not present

## 2016-02-24 LAB — LIPID PANEL PICCOLO, WAIVED
CHOL/HDL RATIO PICCOLO,WAIVE: 2.9 mg/dL
Cholesterol Piccolo, Waived: 158 mg/dL (ref <200)
HDL Chol Piccolo, Waived: 55 mg/dL — ABNORMAL LOW (ref >59)
LDL CHOL CALC PICCOLO WAIVED: 72 mg/dL (ref <100)
TRIGLYCERIDES PICCOLO,WAIVED: 157 mg/dL — AB (ref <150)
VLDL CHOL CALC PICCOLO,WAIVE: 31 mg/dL — AB (ref <30)

## 2016-02-24 MED ORDER — VALSARTAN-HYDROCHLOROTHIAZIDE 160-25 MG PO TABS: 1.0000 | ORAL_TABLET | Freq: Every day | ORAL | Status: DC

## 2016-02-24 MED ORDER — ATORVASTATIN CALCIUM 20 MG PO TABS: 20.0000 mg | ORAL_TABLET | Freq: Every day | ORAL | Status: DC

## 2016-02-24 MED ORDER — AMLODIPINE BESYLATE 5 MG PO TABS: 5.0000 mg | ORAL_TABLET | Freq: Every day | ORAL | Status: DC

## 2016-02-24 MED ORDER — OXYBUTYNIN CHLORIDE 5 MG PO TABS: 5.0000 mg | ORAL_TABLET | Freq: Two times a day (BID) | ORAL | Status: DC

## 2016-02-24 MED ORDER — METOPROLOL SUCCINATE ER 100 MG PO TB24: 100.0000 mg | ORAL_TABLET | Freq: Every day | ORAL | Status: DC

## 2016-02-24 NOTE — Patient Instructions (Signed)
Tdap Vaccine (Tetanus, Diphtheria and Pertussis): What You Need to Know 1. Why get vaccinated? Tetanus, diphtheria and pertussis are very serious diseases. Tdap vaccine can protect us from these diseases. And, Tdap vaccine given to pregnant women can protect newborn babies against pertussis. TETANUS (Lockjaw) is rare in the [place] today. It causes painful muscle tightening and stiffness, usually all over the body.  It can lead to tightening of muscles in the head and neck so you can't open your mouth, swallow, or sometimes even breathe. Tetanus kills about 1 out of 10 people who are infected even after receiving the best medical care. DIPHTHERIA is also rare in the [place] today. It can cause a thick coating to form in the back of the throat.  It can lead to breathing problems, heart failure, paralysis, and death. PERTUSSIS (Whooping Cough) causes severe coughing spells, which can cause difficulty breathing, vomiting and disturbed sleep.  It can also lead to weight loss, incontinence, and rib fractures. Up to 2 in 100 adolescents and 5 in 100 adults with pertussis are hospitalized or have complications, which could include pneumonia or death. These diseases are caused by bacteria. Diphtheria and pertussis are spread from person to person through secretions from coughing or sneezing. Tetanus enters the body through cuts, scratches, or wounds. Before vaccines, as many as 200,000 cases of diphtheria, 200,000 cases of pertussis, and hundreds of cases of tetanus, were reported in the [place] each year. Since vaccination began, reports of cases for tetanus and diphtheria have dropped by about 99% and for pertussis by about 80%. 2. Tdap vaccine Tdap vaccine can protect adolescents and adults from tetanus, diphtheria, and pertussis. One dose of Tdap is routinely given at age 11 or 12. People who did not get Tdap at that age should get it as soon as possible. Tdap is especially important  for healthcare professionals and anyone having close contact with a baby younger than 12 months. Pregnant women should get a dose of Tdap during every pregnancy, to protect the newborn from pertussis. Infants are most at risk for severe, life-threatening complications from pertussis. Another vaccine, called Td, protects against tetanus and diphtheria, but not pertussis. A Td booster should be given every 10 years. Tdap may be given as one of these boosters if you have never gotten Tdap before. Tdap may also be given after a severe cut or burn to prevent tetanus infection. Your doctor or the person giving you the vaccine can give you more information. Tdap may safely be given at the same time as other vaccines. 3. Some people should not get this vaccine  A person who has ever had a life-threatening allergic reaction after a previous dose of any diphtheria, tetanus or pertussis containing vaccine, OR has a severe allergy to any part of this vaccine, should not get Tdap vaccine. Tell the person giving the vaccine about any severe allergies.  Anyone who had coma or long repeated seizures within 7 days after a childhood dose of DTP or DTaP, or a previous dose of Tdap, should not get Tdap, unless a cause other than the vaccine was found. They can still get Td.  Talk to your doctor if you:  have seizures or another nervous system problem,  had severe pain or swelling after any vaccine containing diphtheria, tetanus or pertussis,  ever had a condition called Guillain-Barr Syndrome (GBS),  aren't feeling well on the day the shot is scheduled. 4. Risks With any medicine, including vaccines, there is   a chance of side effects. These are usually mild and go away on their own. Serious reactions are also possible but are rare. Most people who get Tdap vaccine do not have any problems with it. Mild problems following Tdap (Did not interfere with activities)  Pain where the shot was given (about 3 in 4  adolescents or 2 in 3 adults)  Redness or swelling where the shot was given (about 1 person in 5)  Mild fever of at least 100.4F (up to about 1 in 25 adolescents or 1 in 100 adults)  Headache (about 3 or 4 people in 10)  Tiredness (about 1 person in 3 or 4)  Nausea, vomiting, diarrhea, stomach ache (up to 1 in 4 adolescents or 1 in 10 adults)  Chills, sore joints (about 1 person in 10)  Body aches (about 1 person in 3 or 4)  Rash, swollen glands (uncommon) Moderate problems following Tdap (Interfered with activities, but did not require medical attention)  Pain where the shot was given (up to 1 in 5 or 6)  Redness or swelling where the shot was given (up to about 1 in 16 adolescents or 1 in 12 adults)  Fever over 102F (about 1 in 100 adolescents or 1 in 250 adults)  Headache (about 1 in 7 adolescents or 1 in 10 adults)  Nausea, vomiting, diarrhea, stomach ache (up to 1 or 3 people in 100)  Swelling of the entire arm where the shot was given (up to about 1 in 500). Severe problems following Tdap (Unable to perform usual activities; required medical attention)  Swelling, severe pain, bleeding and redness in the arm where the shot was given (rare). Problems that could happen after any vaccine:  People sometimes faint after a medical procedure, including vaccination. Sitting or lying down for about 15 minutes can help prevent fainting, and injuries caused by a fall. Tell your doctor if you feel dizzy, or have vision changes or ringing in the ears.  Some people get severe pain in the shoulder and have difficulty moving the arm where a shot was given. This happens very rarely.  Any medication can cause a severe allergic reaction. Such reactions from a vaccine are very rare, estimated at fewer than 1 in a million doses, and would happen within a few minutes to a few hours after the vaccination. As with any medicine, there is a very remote chance of a vaccine causing a serious  injury or death. The safety of vaccines is always being monitored. For more information, visit: www.cdc.gov/vaccinesafety/ 5. What if there is a serious problem? What should I look for?  Look for anything that concerns you, such as signs of a severe allergic reaction, very high fever, or unusual behavior.  Signs of a severe allergic reaction can include hives, swelling of the face and throat, difficulty breathing, a fast heartbeat, dizziness, and weakness. These would usually start a few minutes to a few hours after the vaccination. What should I do?  If you think it is a severe allergic reaction or other emergency that can't wait, call 9-1-1 or get the person to the nearest hospital. Otherwise, call your doctor.  Afterward, the reaction should be reported to the Vaccine Adverse Event Reporting System (VAERS). Your doctor might file this report, or you can do it yourself through the VAERS web site at www.vaers.hhs.gov, or by calling 1-800-822-7967. VAERS does not give medical advice.  6. The National Vaccine Injury Compensation Program The National Vaccine Injury Compensation Program (  VICP) is a federal program that was created to compensate people who may have been injured by certain vaccines. Persons who believe they may have been injured by a vaccine can learn about the program and about filing a claim by calling 1-800-338-2382 or visiting the VICP website at www.hrsa.gov/vaccinecompensation. There is a time limit to file a claim for compensation. 7. How can I learn more?  Ask your doctor. He or she can give you the vaccine package insert or suggest other sources of information.  Call your local or state health department.  Contact the Centers for Disease Control and Prevention (CDC):  Call 1-800-232-4636 (1-800-CDC-INFO) or  Visit CDC's website at www.cdc.gov/vaccines CDC Tdap Vaccine VIS (11/06/13)   This information is not intended to replace advice given to you by your health care  provider. Make sure you discuss any questions you have with your health care provider.   Document Released: 02/29/2012 Document Revised: 09/20/2014 Document Reviewed: 12/12/2013 Elsevier Interactive Patient Education 2016 [employer]  

## 2016-02-24 NOTE — Progress Notes (Signed)
BP 133/83 mmHg  Pulse 59  Temp(Src) 98.4 F (36.9 C)  Wt 241 lb (109.317 kg)  SpO2 97%  LMP  (LMP Unknown)   Subjective:    Patient ID: Anne Bell, female    DOB: Jan 09, 1951, 65 y.o.   MRN: [id number]  HPI: Anne Bell is a 65 y.o. female  Chief Complaint  Patient presents with  . Hypertension  . Hyperlipidemia   HYPERTENSION / HYPERLIPIDEMIA Satisfied with current treatment? yes Duration of hypertension: chronic BP monitoring frequency: not checking BP medication side effects: no Duration of hyperlipidemia: chronic Cholesterol medication side effects: no Cholesterol supplements: none Past cholesterol medications: atorvastatin Medication compliance: excellent compliance Aspirin: no Recent stressors: no Recurrent headaches: no Visual changes: no Palpitations: no Dyspnea: no Chest pain: no Lower extremity edema: no Dizzy/lightheaded: no  Relevant past medical, surgical, family and social history reviewed and updated as indicated. Interim medical history since our last visit reviewed. Allergies and medications reviewed and updated.  Review of Systems  Constitutional: Negative.   Respiratory: Negative.   Cardiovascular: Negative.   Psychiatric/Behavioral: Negative.     Per HPI unless specifically indicated above     Objective:    BP 133/83 mmHg  Pulse 59  Temp(Src) 98.4 F (36.9 C)  Wt 241 lb (109.317 kg)  SpO2 97%  LMP  (LMP Unknown)  Wt Readings from Last 3 Encounters:  02/24/16 241 lb (109.317 kg)  08/21/15 243 lb (110.224 kg)  05/30/15 246 lb 6.4 oz (111.766 kg)    Physical Exam  Constitutional: She is oriented to person, place, and time. She appears well-developed and well-nourished. No distress.  HENT:  Head: Normocephalic and atraumatic.  Right Ear: Hearing normal.  Left Ear: Hearing normal.  Nose: Nose normal.  Eyes: Conjunctivae and lids are normal. Right eye exhibits no discharge. Left eye exhibits no discharge. No scleral icterus.   Cardiovascular: Normal rate, regular rhythm, normal heart sounds and intact distal pulses.  Exam reveals no gallop and no friction rub.   No murmur heard. Pulmonary/Chest: Effort normal and breath sounds normal. No respiratory distress. She has no wheezes. She has no rales. She exhibits no tenderness.  Musculoskeletal: Normal range of motion.  Neurological: She is alert and oriented to person, place, and time.  Skin: Skin is warm, dry and intact. No rash noted. No erythema. No pallor.  Psychiatric: She has a normal mood and affect. Her speech is normal and behavior is normal. Judgment and thought content normal. Cognition and memory are normal.  Nursing note and vitals reviewed.   Results for orders placed or performed in visit on 08/21/15  Microscopic Examination  Result Value Ref Range   WBC, UA 0-5 0 -  5 /hpf   RBC, UA 0-2 0 -  2 /hpf   Epithelial Cells (non renal) 0-10 0 - 10 /hpf   Bacteria, UA Few None seen/Few  Lipid Panel w/o Chol/HDL Ratio  Result Value Ref Range   Cholesterol, Total 176 100 - 199 mg/dL   Triglycerides 132 (H) 0 - 149 mg/dL   HDL 50 >44 mg/dL   VLDL Cholesterol Cal 39 5 - 40 mg/dL   LDL Calculated 87 0 - 99 mg/dL  CBC With Differential/Platelet  Result Value Ref Range   WBC 8.9 3.4 - 10.8 x10E3/uL   RBC 4.46 3.77 - 5.28 x10E6/uL   Hemoglobin 14.3 11.1 - 15.9 g/dL   Hematocrit 01.0 27.2 - 46.6 %   MCV 94 79 - 97 fL  MCH 32.1 26.6 - 33.0 pg   MCHC 34.2 31.5 - 35.7 g/dL   RDW 53.6 64.4 - 03.4 %   Platelets 210 150 - 379 x10E3/uL   Neutrophils 75 %   Lymphs 17 %   MID 8 %   Neutrophils Absolute 6.7 1.4 - 7.0 x10E3/uL   Lymphocytes Absolute 1.5 0.7 - 3.1 x10E3/uL   MID (Absolute) 0.7 0.1 - 1.6 X10E3/uL  UA/M w/rflx Culture, Routine  Result Value Ref Range   Urine Culture, Routine Final report    Urine Culture result 1 Comment   Microalbumin, Urine Waived  Result Value Ref Range   Microalb, Ur Waived 30 (H) 0 - 19 mg/L   Creatinine, Urine Waived  100 10 - 300 mg/dL   Microalb/Creat Ratio <30 <30 mg/g  Comprehensive metabolic panel  Result Value Ref Range   Glucose 101 (H) 65 - 99 mg/dL   BUN 17 8 - 27 mg/dL   Creatinine, Ser 7.42 0.57 - 1.00 mg/dL   GFR calc non Af Amer 95 >59 mL/min/1.73   GFR calc Af Amer 110 >59 mL/min/1.73   BUN/Creatinine Ratio 28 (H) 11 - 26   Sodium 139 136 - 144 mmol/L   Potassium 4.1 3.5 - 5.2 mmol/L   Chloride 97 97 - 106 mmol/L   CO2 26 18 - 29 mmol/L   Calcium 10.5 (H) 8.7 - 10.3 mg/dL   Total Protein 7.3 6.0 - 8.5 g/dL   Albumin 4.5 3.6 - 4.8 g/dL   Globulin, Total 2.8 1.5 - 4.5 g/dL   Albumin/Globulin Ratio 1.6 1.1 - 2.5   Bilirubin Total 0.6 0.0 - 1.2 mg/dL   Alkaline Phosphatase 96 39 - 117 IU/L   AST 23 0 - 40 IU/L   ALT 24 0 - 32 IU/L  HIV antibody  Result Value Ref Range   HIV Screen 4th Generation wRfx Non Reactive Non Reactive  TSH  Result Value Ref Range   TSH 0.794 0.450 - 4.500 uIU/mL  Hepatitis C antibody  Result Value Ref Range   Hep C Virus Ab <0.1 0.0 - 0.9 s/co ratio  VITAMIN D 25 Hydroxy (Vit-D Deficiency, Fractures)  Result Value Ref Range   Vit D, 25-Hydroxy 28.9 (L) 30.0 - 100.0 ng/mL      Assessment & Plan:   Problem List Items Addressed This Visit      Genitourinary   Benign hypertensive renal disease - Primary    Under good control. Continue current regimen. Continue to monitor. Call with any concerns. Refills given today.      Relevant Medications   metoprolol succinate (TOPROL-XL) 100 MG 24 hr tablet   amLODipine (NORVASC) 5 MG tablet   valsartan-hydrochlorothiazide (DIOVAN-HCT) 160-25 MG tablet   Other Relevant Orders   Comprehensive metabolic panel     Other   Hypercholesteremia    Under good control. Continue current regimen. Continue to monitor. Call with any concerns. Refills given today.      Relevant Medications   metoprolol succinate (TOPROL-XL) 100 MG 24 hr tablet   atorvastatin (LIPITOR) 20 MG tablet   amLODipine (NORVASC) 5 MG tablet    valsartan-hydrochlorothiazide (DIOVAN-HCT) 160-25 MG tablet   Other Relevant Orders   Comprehensive metabolic panel   Lipid Panel Piccolo, Waived    Other Visit Diagnoses    Need for Tdap vaccination        Tdap given today.     Relevant Orders    Tdap vaccine greater than or equal to 7yo IM (  Completed)        Follow up plan: Return in about 6 months (around 08/25/2016) for Wellness exam.

## 2016-02-24 NOTE — Assessment & Plan Note (Signed)
Under good control. Continue current regimen. Continue to monitor. Call with any concerns. Refills given today. 

## 2016-02-25 ENCOUNTER — Encounter: Payer: Self-pay | Admitting: Family Medicine

## 2016-02-25 LAB — COMPREHENSIVE METABOLIC PANEL
ALBUMIN: 4.4 g/dL (ref 3.6–4.8)
ALT: 21 IU/L (ref 0–32)
AST: 21 IU/L (ref 0–40)
Albumin/Globulin Ratio: 1.6 (ref 1.2–2.2)
Alkaline Phosphatase: 86 IU/L (ref 39–117)
BUN/Creatinine Ratio: 27 (ref 12–28)
BUN: 20 mg/dL (ref 8–27)
Bilirubin Total: 0.5 mg/dL (ref 0.0–1.2)
CALCIUM: 10.1 mg/dL (ref 8.7–10.3)
CO2: 24 mmol/L (ref 18–29)
CREATININE: 0.75 mg/dL (ref 0.57–1.00)
Chloride: 96 mmol/L (ref 96–106)
GFR, EST AFRICAN AMERICAN: 97 mL/min/{1.73_m2} (ref >59)
GFR, EST NON AFRICAN AMERICAN: 84 mL/min/{1.73_m2} (ref >59)
GLUCOSE: 98 mg/dL (ref 65–99)
Globulin, Total: 2.8 g/dL (ref 1.5–4.5)
POTASSIUM: 4.2 mmol/L (ref 3.5–5.2)
SODIUM: 138 mmol/L (ref 134–144)
TOTAL PROTEIN: 7.2 g/dL (ref 6.0–8.5)

## 2016-02-27 ENCOUNTER — Telehealth: Payer: Self-pay | Admitting: Family Medicine

## 2016-02-27 DIAGNOSIS — E7800 Pure hypercholesterolemia, unspecified: Secondary | ICD-10-CM

## 2016-02-27 DIAGNOSIS — I129 Hypertensive chronic kidney disease with stage 1 through stage 4 chronic kidney disease, or unspecified chronic kidney disease: Secondary | ICD-10-CM

## 2016-02-27 MED ORDER — VALSARTAN-HYDROCHLOROTHIAZIDE 160-25 MG PO TABS: 1.0000 | ORAL_TABLET | Freq: Every day | ORAL | Status: DC

## 2016-02-27 MED ORDER — ATORVASTATIN CALCIUM 20 MG PO TABS: 20.0000 mg | ORAL_TABLET | Freq: Every day | ORAL | Status: DC

## 2016-02-27 MED ORDER — AMLODIPINE BESYLATE 5 MG PO TABS: 5.0000 mg | ORAL_TABLET | Freq: Every day | ORAL | Status: DC

## 2016-02-27 MED ORDER — METOPROLOL SUCCINATE ER 100 MG PO TB24
100.0000 mg | ORAL_TABLET | Freq: Every day | ORAL | Status: DC
Start: 2016-02-27 — End: 2016-08-11

## 2016-02-27 MED ORDER — OXYBUTYNIN CHLORIDE 5 MG PO TABS: 5.0000 mg | ORAL_TABLET | Freq: Two times a day (BID) | ORAL | Status: DC

## 2016-02-27 NOTE — Telephone Encounter (Signed)
[facility]

## 2016-02-27 NOTE — Telephone Encounter (Signed)
Rx sent to [facility]

## 2016-02-27 NOTE — Telephone Encounter (Signed)
Pt called [facility]. Please resend. Thanks.

## 2016-05-10 ENCOUNTER — Ambulatory Visit (INDEPENDENT_AMBULATORY_CARE_PROVIDER_SITE_OTHER): Payer: Medicare Other | Admitting: Podiatry

## 2016-05-10 ENCOUNTER — Encounter: Payer: Self-pay | Admitting: Podiatry

## 2016-05-10 ENCOUNTER — Ambulatory Visit (INDEPENDENT_AMBULATORY_CARE_PROVIDER_SITE_OTHER): Payer: Medicare Other

## 2016-05-10 DIAGNOSIS — M2041 Other hammer toe(s) (acquired), right foot: Secondary | ICD-10-CM | POA: Diagnosis not present

## 2016-05-10 DIAGNOSIS — M7750 Other enthesopathy of unspecified foot: Secondary | ICD-10-CM | POA: Diagnosis not present

## 2016-05-10 DIAGNOSIS — M779 Enthesopathy, unspecified: Secondary | ICD-10-CM

## 2016-05-10 DIAGNOSIS — M778 Other enthesopathies, not elsewhere classified: Secondary | ICD-10-CM

## 2016-05-10 DIAGNOSIS — M7671 Peroneal tendinitis, right leg: Secondary | ICD-10-CM | POA: Diagnosis not present

## 2016-05-10 NOTE — Progress Notes (Signed)
   Subjective:    Patient ID: Anne Bell, female    DOB: 08-16-1951, 65 y.o.   MRN: [id number]  HPI: She presents today for chief complaint of pain to the dorsolateral aspect of her right foot and ankle. She states that sometimes it radiates across the arch. She states that she had a fall if he is back landing on her rear end which causes her sciatica to become more painful. She states that she really landed on her right hip at that time and since that time her right ankle has been sore. She states that she's noticed a creaking and a cracking popping while she is walking. She also gets the pain during ambulation across her midfoot. Also concerned about the second digit of the left foot which appears to be starting to come up and rub her shoes more.  Review of Systems  HENT: Positive for sinus pressure.   Hematological: Bruises/bleeds easily.  All other systems reviewed and are negative.      Objective:   Physical Exam: Vital signs are stable she is alert and oriented 3. Pulses are palpable. Neurologic sensorium is intact. Degenerative flexors are intact. Muscle strength is normal bilateral. Orthopedic evaluations demonstrates contracture deformity at the second PIPJ and DIPJ with dorsiflexor contracture deformity at the second metatarsophalangeal joint of the left foot. She also has pain on end range of motion of the subtalar joint of the right foot with pain on palpation of the sinus tarsi right foot. Radiographs taken today do demonstrate some possible early osteoarthritic changes of the midfoot cannot rule out osteoarthritic change or possibly old fracture to the anterior process of the calcaneus. She does have pain on palpation of the sinus tarsi area. Cutaneous evaluation demonstrates no erythema cellulitis drainage or odor.      Assessment & Plan:  Assessment: Capsulitis subtalar joint osteoarthritis and hammertoe deformity with capsulitis of the second metatarsophalangeal joint left  foot.  Plan: I injected periarticular early today about the second metatarsophalangeal joint Kenalog and local anesthetic. After sterile Betadine skin prep overlying the sinus tarsi injected the sinus tarsi today with Kenalog and local anesthetic. She was also provided with diclofenac gel to apply multiple times a day.

## 2016-05-11 ENCOUNTER — Telehealth: Payer: Self-pay

## 2016-05-11 ENCOUNTER — Other Ambulatory Visit: Payer: Self-pay | Admitting: Family Medicine

## 2016-05-11 DIAGNOSIS — J0100 Acute maxillary sinusitis, unspecified: Secondary | ICD-10-CM

## 2016-05-11 MED ORDER — FLUTICASONE PROPIONATE 50 MCG/ACT NA SUSP: 2.0000 | Freq: Every day | NASAL | 12 refills | Status: DC

## 2016-05-11 NOTE — Telephone Encounter (Signed)
Please send a prescription for flonase to [facility]

## 2016-05-11 NOTE — Telephone Encounter (Signed)
Rx sent to her pharmacy 

## 2016-06-03 DIAGNOSIS — M436 Torticollis: Secondary | ICD-10-CM | POA: Diagnosis not present

## 2016-06-03 DIAGNOSIS — M4602 Spinal enthesopathy, cervical region: Secondary | ICD-10-CM | POA: Diagnosis not present

## 2016-06-03 DIAGNOSIS — M9901 Segmental and somatic dysfunction of cervical region: Secondary | ICD-10-CM | POA: Diagnosis not present

## 2016-06-16 ENCOUNTER — Ambulatory Visit: Payer: Medicare Other | Admitting: Podiatry

## 2016-07-19 ENCOUNTER — Other Ambulatory Visit: Payer: Self-pay | Admitting: Family Medicine

## 2016-07-19 DIAGNOSIS — Z1231 Encounter for screening mammogram for malignant neoplasm of breast: Secondary | ICD-10-CM

## 2016-08-11 ENCOUNTER — Other Ambulatory Visit: Payer: Self-pay | Admitting: Family Medicine

## 2016-08-11 DIAGNOSIS — I129 Hypertensive chronic kidney disease with stage 1 through stage 4 chronic kidney disease, or unspecified chronic kidney disease: Secondary | ICD-10-CM

## 2016-08-11 DIAGNOSIS — E7800 Pure hypercholesterolemia, unspecified: Secondary | ICD-10-CM

## 2016-08-11 NOTE — Telephone Encounter (Signed)
Routing to provider  

## 2016-08-24 ENCOUNTER — Ambulatory Visit
Admission: RE | Admit: 2016-08-24 | Discharge: 2016-08-24 | Disposition: A | Discharge status: Home / Self Care | Payer: Medicare Other | Source: Ambulatory Visit | Attending: Family Medicine | Admitting: Family Medicine

## 2016-08-24 ENCOUNTER — Encounter: Payer: Self-pay | Admitting: Family Medicine

## 2016-08-24 DIAGNOSIS — Z1231 Encounter for screening mammogram for malignant neoplasm of breast: Secondary | ICD-10-CM

## 2016-08-30 ENCOUNTER — Ambulatory Visit: Payer: Medicare Other | Admitting: Family Medicine

## 2016-12-28 ENCOUNTER — Encounter: Payer: Self-pay | Admitting: Family Medicine

## 2016-12-28 ENCOUNTER — Ambulatory Visit (INDEPENDENT_AMBULATORY_CARE_PROVIDER_SITE_OTHER): Payer: Medicare Other | Admitting: Family Medicine

## 2016-12-28 VITALS — BP 137/78 | HR 68 | Temp 97.6°F | Ht 61.5 in | Wt 234.4 lb

## 2016-12-28 DIAGNOSIS — E559 Vitamin D deficiency, unspecified: Secondary | ICD-10-CM

## 2016-12-28 DIAGNOSIS — M8580 Other specified disorders of bone density and structure, unspecified site: Secondary | ICD-10-CM | POA: Diagnosis not present

## 2016-12-28 DIAGNOSIS — R350 Frequency of micturition: Secondary | ICD-10-CM

## 2016-12-28 DIAGNOSIS — Z0000 Encounter for general adult medical examination without abnormal findings: Secondary | ICD-10-CM | POA: Diagnosis not present

## 2016-12-28 DIAGNOSIS — E7800 Pure hypercholesterolemia, unspecified: Secondary | ICD-10-CM | POA: Diagnosis not present

## 2016-12-28 DIAGNOSIS — I129 Hypertensive chronic kidney disease with stage 1 through stage 4 chronic kidney disease, or unspecified chronic kidney disease: Secondary | ICD-10-CM | POA: Diagnosis not present

## 2016-12-28 DIAGNOSIS — J301 Allergic rhinitis due to pollen: Secondary | ICD-10-CM

## 2016-12-28 DIAGNOSIS — Z23 Encounter for immunization: Secondary | ICD-10-CM | POA: Diagnosis not present

## 2016-12-28 DIAGNOSIS — E66813 Obesity, class 3: Secondary | ICD-10-CM

## 2016-12-28 DIAGNOSIS — F3341 Major depressive disorder, recurrent, in partial remission: Secondary | ICD-10-CM

## 2016-12-28 DIAGNOSIS — Z6841 Body Mass Index (BMI) 40.0 and over, adult: Secondary | ICD-10-CM

## 2016-12-28 DIAGNOSIS — N3281 Overactive bladder: Secondary | ICD-10-CM

## 2016-12-28 MED ORDER — OXYBUTYNIN CHLORIDE 5 MG PO TABS: 5.0000 mg | ORAL_TABLET | Freq: Two times a day (BID) | ORAL | 1 refills | Status: DC

## 2016-12-28 MED ORDER — METOPROLOL SUCCINATE ER 100 MG PO TB24
100.0000 mg | ORAL_TABLET | Freq: Every day | ORAL | 1 refills | Status: DC
Start: 2016-12-28 — End: 2017-07-05

## 2016-12-28 MED ORDER — VALSARTAN-HYDROCHLOROTHIAZIDE 160-25 MG PO TABS
1.0000 | ORAL_TABLET | Freq: Every day | ORAL | 1 refills | Status: DC
Start: 2016-12-28 — End: 2017-07-05

## 2016-12-28 MED ORDER — AMLODIPINE BESYLATE 5 MG PO TABS: 5.0000 mg | ORAL_TABLET | Freq: Every day | ORAL | 1 refills | Status: DC

## 2016-12-28 MED ORDER — FLUTICASONE PROPIONATE 50 MCG/ACT NA SUSP: 2.0000 | Freq: Every day | NASAL | 12 refills | Status: DC

## 2016-12-28 MED ORDER — ATORVASTATIN CALCIUM 20 MG PO TABS: 20.0000 mg | ORAL_TABLET | Freq: Every day | ORAL | 1 refills | Status: DC

## 2016-12-28 NOTE — Assessment & Plan Note (Signed)
Better on recheck. Continue current regimen. Continue to monitor. Labs checked today. 

## 2016-12-28 NOTE — Assessment & Plan Note (Signed)
Continue to work on diet and exercise. Call with any concerns.  

## 2016-12-28 NOTE — Assessment & Plan Note (Signed)
Stable.       - Continue to monitor

## 2016-12-28 NOTE — Assessment & Plan Note (Signed)
Checking vitamin D today. Continue exercise. Continue to monitor.

## 2016-12-28 NOTE — Progress Notes (Signed)
BP 137/78 (BP Location: Right Arm, Cuff Size: Large)   Pulse 68   Temp 97.6 F (36.4 C)   Ht 5' 1.5" (1.562 m)   Wt 234 lb 6.4 oz (106.3 kg)   LMP  (LMP Unknown)   SpO2 100%   BMI 43.57 kg/m    Subjective:    Patient ID: Anne Bell, female    DOB: [birth year], 66 y.o.   MRN: [id number]  HPI: Anne Bell is a 66 y.o. female presenting on 12/28/2016 for comprehensive medical examination. Current medical complaints include:  HYPERTENSION / HYPERLIPIDEMIA Satisfied with current treatment? no Duration of hypertension: chronic BP monitoring frequency: not checking BP medication side effects: no Past BP meds: valsartan, hctz, metoprolol, amlodipine Duration of hyperlipidemia: chronic Cholesterol medication side effects: no Cholesterol supplements: none Past cholesterol medications: atorvastain (lipitor) Medication compliance: excellent compliance Aspirin: yes Recent stressors: no Recurrent headaches: no Visual changes: no Palpitations: no Dyspnea: no Chest pain: no Lower extremity edema: no Dizzy/lightheaded: no  Menopausal Symptoms: no  Functional Status Survey: Is the patient deaf or have difficulty hearing?: No Does the patient have difficulty seeing, even when wearing glasses/contacts?: No Does the patient have difficulty concentrating, remembering, or making decisions?: No Does the patient have difficulty walking or climbing stairs?: No Does the patient have difficulty dressing or bathing?: No Does the patient have difficulty doing errands alone such as visiting a doctor's office or shopping?: No  Fall Risk  12/28/2016 05/30/2015  Falls in the past year? No No    Depression Screen Depression screen [facility] 2/9 12/28/2016 08/21/2015 05/30/2015  Decreased Interest 0 0 0  Down, Depressed, Hopeless 0 0 0  PHQ - 2 Score 0 0 0  Altered sleeping - 2 -  Tired, decreased energy - 0 -  Change in appetite - 0 -  Feeling bad or failure about yourself  - 0 -  Trouble  concentrating - 0 -  Moving slowly or fidgety/restless - 0 -  Suicidal thoughts - 0 -  PHQ-9 Score - 2 -  Difficult doing work/chores - Not difficult at all -    Advanced Directives Does patient have a HCPOA?    yes Does patient have a living will or MOST form?  yes  Past Medical History:  Past Medical History:  Diagnosis Date  . Allergy   . Depression   . Hyperlipidemia   . Hypertension     Surgical History:  Past Surgical History:  Procedure Laterality Date  . BREAST CYST ASPIRATION  1970's   ? side  . PLANTAR FASCIA SURGERY Left 2008    Medications:  Current Outpatient Prescriptions on File Prior to Visit  Medication Sig  . Lutein 6 MG CAPS Take 6 mg by mouth daily.   . MULTIPLE VITAMIN PO Take 1 tablet by mouth daily.   Marland Kitchen SSD 1 % cream Apply to affected area(s)  topically every day   No current facility-administered medications on file prior to visit.     Allergies:  Allergies  Allergen Reactions  . Celecoxib     stomach cramps  . Other   . Prednisone Other (See Comments)    Social History:  Social History   Social History  . Marital status: Divorced    Spouse name: N/A  . Number of children: N/A  . Years of education: N/A   Occupational History  . Retired    Social History Main Topics  . Smoking status: Never Smoker  . Smokeless tobacco: Never  Used  . Alcohol use Yes     Comment: occasional  . Drug use: No  . Sexual activity: Yes    Birth control/ protection: None   Other Topics Concern  . Not on file   Social History Narrative  . No narrative on file   History  Smoking Status  . Never Smoker  Smokeless Tobacco  . Never Used   History  Alcohol Use  . Yes    Comment: occasional    Family History:  Family History  Problem Relation Age of Onset  . Alcohol abuse Father   . Mental illness Mother     Depression  . Cancer Mother   . Breast cancer Neg Hx     Past medical history, surgical history, medications, allergies,  family history and social history reviewed with patient today and changes made to appropriate areas of the chart.   Review of Systems  Constitutional: Negative.   HENT: Negative.   Eyes: Negative.   Respiratory: Negative.   Cardiovascular: Positive for leg swelling (L foot swelling). Negative for chest pain, palpitations, orthopnea, claudication and PND.  Gastrointestinal: Positive for diarrhea. Negative for abdominal pain, blood in stool, constipation, heartburn, melena, nausea and vomiting.  Genitourinary: Negative.   Musculoskeletal: Negative.   Skin: Negative.   Neurological: Positive for tingling. Negative for dizziness, tremors, sensory change, speech change, focal weakness, seizures, loss of consciousness and headaches.  Endo/Heme/Allergies: Positive for environmental allergies. Negative for polydipsia. Bruises/bleeds easily.  Psychiatric/Behavioral: Negative.     All other ROS negative except what is listed above and in the HPI.      Objective:    BP 137/78 (BP Location: Right Arm, Cuff Size: Large)   Pulse 68   Temp 97.6 F (36.4 C)   Ht 5' 1.5" (1.562 m)   Wt 234 lb 6.4 oz (106.3 kg)   LMP  (LMP Unknown)   SpO2 100%   BMI 43.57 kg/m   Wt Readings from Last 3 Encounters:  12/28/16 234 lb 6.4 oz (106.3 kg)  02/24/16 241 lb (109.3 kg)  08/21/15 243 lb (110.2 kg)     Hearing Screening             Right ear:   Left ear:   Visual Acuity Screening   Right eye Left eye Both eyes  Without correction:     With correction:    Physical Exam  Constitutional: She is oriented to person, place, and time. She appears well-developed and well-nourished. No distress.  HENT:  Head: Normocephalic and atraumatic.  Right Ear: Hearing and external ear normal.  Left Ear: Hearing and external ear normal.  Nose: Nose normal.  Mouth/Throat: Oropharynx is clear and moist. No  oropharyngeal exudate.  Eyes: Conjunctivae, EOM and lids are normal. Pupils are equal, round, and reactive to light. Right eye exhibits no discharge. Left eye exhibits no discharge. No scleral icterus.  Neck: Normal range of motion. Neck supple. No JVD present. No tracheal deviation present. No thyromegaly present.  Cardiovascular: Normal rate, regular rhythm, normal heart sounds and intact distal pulses.  Exam reveals no gallop and no friction rub.   No murmur heard. Pulmonary/Chest: Effort normal and breath sounds normal. No stridor. No respiratory distress. She has no wheezes. She has no rales. She exhibits no tenderness.  Abdominal: Soft. Bowel sounds are normal. She exhibits no distension and no mass.  There is no tenderness. There is no rebound and no guarding.  Genitourinary:  Genitourinary Comments: Breast and pelvic exams deferred with shared decision making  Musculoskeletal: Normal range of motion. She exhibits edema (2+ edema on L ankle). She exhibits no tenderness or deformity.  Lymphadenopathy:    She has no cervical adenopathy.  Neurological: She is alert and oriented to person, place, and time. She displays normal reflexes. No cranial nerve deficit. She exhibits normal muscle tone. Coordination normal.  Skin: Skin is warm, dry and intact. No rash noted. She is not diaphoretic. No erythema. No pallor.  Psychiatric: She has a normal mood and affect. Her speech is normal and behavior is normal. Judgment and thought content normal. Cognition and memory are normal.  Nursing note and vitals reviewed.  6CIT Screen 12/28/2016  What Year? 0 points  What month? 0 points  What time? 0 points  Count back from 20 0 points  Months in reverse 2 points  Repeat phrase 4 points  Total Score 6     Results for orders placed or performed in visit on 02/24/16  Comprehensive metabolic panel  Result Value Ref Range   Glucose 98 65 - 99 mg/dL   BUN 20 8 - 27 mg/dL   Creatinine, Ser 1.61 0.57 -  1.00 mg/dL   GFR calc non Af Amer 84 >59 mL/min/1.73   GFR calc Af Amer 97 >59 mL/min/1.73   BUN/Creatinine Ratio 27 12 - 28   Sodium 138 134 - 144 mmol/L   Potassium 4.2 3.5 - 5.2 mmol/L   Chloride 96 96 - 106 mmol/L   CO2 24 18 - 29 mmol/L   Calcium 10.1 8.7 - 10.3 mg/dL   Total Protein 7.2 6.0 - 8.5 g/dL   Albumin 4.4 3.6 - 4.8 g/dL   Globulin, Total 2.8 1.5 - 4.5 g/dL   Albumin/Globulin Ratio 1.6 1.2 - 2.2   Bilirubin Total 0.5 0.0 - 1.2 mg/dL   Alkaline Phosphatase 86 39 - 117 IU/L   AST 21 0 - 40 IU/L   ALT 21 0 - 32 IU/L  Lipid Panel Piccolo, Waived  Result Value Ref Range   Cholesterol Piccolo, Waived 158 <200 mg/dL   HDL Chol Piccolo, Waived 55 (L) >59 mg/dL   Triglycerides Piccolo,Waived 157 (H) <150 mg/dL   Chol/HDL Ratio Piccolo,Waive 2.9 mg/dL   LDL Chol Calc Piccolo Waived 72 <100 mg/dL   VLDL Chol Calc Piccolo,Waive 31 (H) <30 mg/dL      Assessment & Plan:   Problem List Items Addressed This Visit      Respiratory   Allergic rhinitis    Stable. Continue to monitor.       Relevant Medications   fluticasone (FLONASE) 50 MCG/ACT nasal spray     Musculoskeletal and Integument   Osteopenia    Checking vitamin D today. Continue exercise. Continue to monitor.       Relevant Orders   Comprehensive metabolic panel     Genitourinary   Benign hypertensive renal disease    Better on recheck. Continue current regimen. Continue to monitor. Labs checked today.      Relevant Medications   amLODipine (NORVASC) 5 MG tablet   metoprolol succinate (TOPROL-XL) 100 MG 24 hr tablet   valsartan-hydrochlorothiazide (DIOVAN-HCT) 160-25 MG tablet   Other Relevant Orders   CBC with Differential/Platelet   Comprehensive metabolic panel   Microalbumin, Urine Waived   Overactive bladder    Under good control. Continue current regimen. Continue to monitor. Call  with any concerns. Checking UA today.        Other   Clinical depression    Doing well. PHQ9 good today.  Continue to monitor.       Relevant Orders   Comprehensive metabolic panel   TSH   Hypercholesteremia    Rechecking levels today. Continue current regimen. Call with any concerns.       Relevant Medications   amLODipine (NORVASC) 5 MG tablet   atorvastatin (LIPITOR) 20 MG tablet   metoprolol succinate (TOPROL-XL) 100 MG 24 hr tablet   valsartan-hydrochlorothiazide (DIOVAN-HCT) 160-25 MG tablet   Other Relevant Orders   Comprehensive metabolic panel   Lipid Panel w/o Chol/HDL Ratio   Adiposity    Continue to work on diet and exercise. Call with any concerns.       Avitaminosis D    Rechecking levels today. Will replace as needed.       Relevant Orders   Comprehensive metabolic panel   VITAMIN D 25 Hydroxy (Vit-D Deficiency, Fractures)   RESOLVED: FOM (frequency of micturition)   Relevant Orders   Comprehensive metabolic panel   UA/M w/rflx Culture, Routine    Other Visit Diagnoses    Medicare annual wellness visit, subsequent    -  Primary   Preventative care discussed as below.    Immunization due       Pneumovax given today.       Preventative Services:  AAA screening: N/A Health Risk Assessment and Personalized Prevention Plan: Done today Bone Mass Measurements: Up to date Breast Cancer Screening: up to date CVD Screening: Done today Cervical Cancer Screening: N/A Colon Cancer Screening: up to date Depression Screening: Done today Diabetes Screening: Done today Glaucoma Screening: See your eye doctor Hepatitis B vaccine: N/A Hepatitis C screening: up to date HIV Screening: up to date Flu Vaccine: Postponed to flu season Lung cancer Screening: N/A Obesity Screening: Done today Pneumonia Vaccines (2): Pneumovax given today- up to date STI Screening: N/A  Follow up plan: Return in about 6 months (around 06/29/2017) for BP/Cholesterol follow up.   LABORATORY TESTING:  - Pap smear: not applicable  IMMUNIZATIONS:   - Tdap: Tetanus vaccination status  reviewed: last tetanus booster within 10 years. - Influenza: Postponed to flu season - Pneumovax: Administered today - Prevnar: Up to date - Zostavax vaccine: Up to date  SCREENING: -Mammogram: Up to date  - Colonoscopy: Up to date  - Bone Density: Up to date  -Hearing Test: Done elsewhere   PATIENT COUNSELING:   Advised to take 1 mg of folate supplement per day if capable of pregnancy.   Sexuality: Discussed sexually transmitted diseases, partner selection, use of condoms, avoidance of unintended pregnancy  and contraceptive alternatives.   Advised to avoid cigarette smoking.  I discussed with the patient that most people either abstain from alcohol or drink within safe limits (<=14/week and <=4 drinks/occasion for males, <=7/weeks and <= 3 drinks/occasion for females) and that the risk for alcohol disorders and other health effects rises proportionally with the number of drinks per week and how often a drinker exceeds daily limits.  Discussed cessation/primary prevention of drug use and availability of treatment for abuse.   Diet: Encouraged to adjust caloric intake to maintain  or achieve ideal body weight, to reduce intake of dietary saturated fat and total fat, to limit sodium intake by avoiding high sodium foods and not adding table salt, and to maintain adequate dietary potassium and calcium preferably from fresh fruits, vegetables, and  low-fat dairy products.    stressed the importance of regular exercise  Injury prevention: Discussed safety belts, safety helmets, smoke detector, smoking near bedding or upholstery.   Dental health: Discussed importance of regular tooth brushing, flossing, and dental visits.    NEXT PREVENTATIVE PHYSICAL DUE IN 1 YEAR. Return in about 6 months (around 06/29/2017) for BP/Cholesterol follow up.

## 2016-12-28 NOTE — Assessment & Plan Note (Signed)
Rechecking levels today. Continue current regimen. Call with any concerns.  

## 2016-12-28 NOTE — Assessment & Plan Note (Signed)
Under good control. Continue current regimen. Continue to monitor. Call with any concerns. Checking UA today.

## 2016-12-28 NOTE — Assessment & Plan Note (Signed)
Doing well. PHQ9 good today. Continue to monitor.

## 2016-12-28 NOTE — Assessment & Plan Note (Signed)
Rechecking levels today. Will replace as needed.  

## 2016-12-28 NOTE — Patient Instructions (Addendum)
Preventative Services:  AAA screening: N/A Health Risk Assessment and Personalized Prevention Plan: Done today Bone Mass Measurements: Up to date Breast Cancer Screening: up to date CVD Screening: Done today Cervical Cancer Screening: N/A Colon Cancer Screening: up to date Depression Screening: Done today Diabetes Screening: Done today Glaucoma Screening: See your eye doctor Hepatitis B vaccine: N/A Hepatitis C screening: up to date HIV Screening: up to date Flu Vaccine: Postponed to flu season Lung cancer Screening: N/A Obesity Screening: Done today Pneumonia Vaccines (2): Pneumovax given today- up to date STI Screening: N/A Health Maintenance, Female Adopting a healthy lifestyle and getting preventive care can go a long way to promote health and wellness. Talk with your health care provider about what schedule of regular examinations is right for you. This is a good chance for you to check in with your provider about disease prevention and staying healthy. In between checkups, there are plenty of things you can do on your own. Experts have done a lot of research about which lifestyle changes and preventive measures are most likely to keep you healthy. Ask your health care provider for more information. Weight and diet Eat a healthy diet  Be sure to include plenty of vegetables, fruits, low-fat dairy products, and lean protein.  Do not eat a lot of foods high in solid fats, added sugars, or salt.  Get regular exercise. This is one of the most important things you can do for your health.  Most adults should exercise for at least 150 minutes each week. The exercise should increase your heart rate and make you sweat (moderate-intensity exercise).  Most adults should also do strengthening exercises at least twice a week. This is in addition to the moderate-intensity exercise. Maintain a healthy weight  Body mass index (BMI) is a measurement that can be used to identify possible  weight problems. It estimates body fat based on height and weight. Your health care provider can help determine your BMI and help you achieve or maintain a healthy weight.  For females 52 years of age and older:  A BMI below 18.5 is considered underweight.  A BMI of 18.5 to 24.9 is normal.  A BMI of 25 to 29.9 is considered overweight.  A BMI of 30 and above is considered obese. Watch levels of cholesterol and blood lipids  You should start having your blood tested for lipids and cholesterol at 68 years of age, then have this test every 5 years.  You may need to have your cholesterol levels checked more often if:  Your lipid or cholesterol levels are high.  You are older than 67 years of age.  You are at high risk for heart disease. Cancer screening Lung Cancer  Lung cancer screening is recommended for adults 30-65 years old who are at high risk for lung cancer because of a history of smoking.  A yearly low-dose CT scan of the lungs is recommended for people who:  Currently smoke.  Have quit within the past 15 years.  Have at least a 30-pack-year history of smoking. A pack year is smoking an average of one pack of cigarettes a day for 1 year.  Yearly screening should continue until it has been 15 years since you quit.  Yearly screening should stop if you develop a health problem that would prevent you from having lung cancer treatment. Breast Cancer  Practice breast self-awareness. This means understanding how your breasts normally appear and feel.  It also means doing regular breast self-exams.  Let your health care provider know about any changes, no matter how small.  If you are in your 20s or 30s, you should have a clinical breast exam (CBE) by a health care provider every 1-3 years as part of a regular health exam.  If you are 31 or older, have a CBE every year. Also consider having a breast X-ray (mammogram) every year.  If you have a family history of breast  cancer, talk to your health care provider about genetic screening.  If you are at high risk for breast cancer, talk to your health care provider about having an MRI and a mammogram every year.  Breast cancer gene (BRCA) assessment is recommended for women who have family members with BRCA-related cancers. BRCA-related cancers include:  Breast.  Ovarian.  Tubal.  Peritoneal cancers.  Results of the assessment will determine the need for genetic counseling and BRCA1 and BRCA2 testing. Cervical Cancer  Your health care provider may recommend that you be screened regularly for cancer of the pelvic organs (ovaries, uterus, and vagina). This screening involves a pelvic examination, including checking for microscopic changes to the surface of your cervix (Pap test). You may be encouraged to have this screening done every 3 years, beginning at age 39.  For women ages 25-65, health care providers may recommend pelvic exams and Pap testing every 3 years, or they may recommend the Pap and pelvic exam, combined with testing for human papilloma virus (HPV), every 5 years. Some types of HPV increase your risk of cervical cancer. Testing for HPV may also be done on women of any age with unclear Pap test results.  Other health care providers may not recommend any screening for nonpregnant women who are considered low risk for pelvic cancer and who do not have symptoms. Ask your health care provider if a screening pelvic exam is right for you.  If you have had past treatment for cervical cancer or a condition that could lead to cancer, you need Pap tests and screening for cancer for at least 20 years after your treatment. If Pap tests have been discontinued, your risk factors (such as having a new sexual partner) need to be reassessed to determine if screening should resume. Some women have medical problems that increase the chance of getting cervical cancer. In these cases, your health care provider may  recommend more frequent screening and Pap tests. Colorectal Cancer  This type of cancer can be detected and often prevented.  Routine colorectal cancer screening usually begins at 67 years of age and continues through 67 years of age.  Your health care provider may recommend screening at an earlier age if you have risk factors for colon cancer.  Your health care provider may also recommend using home test kits to check for hidden blood in the stool.  A small camera at the end of a tube can be used to examine your colon directly (sigmoidoscopy or colonoscopy). This is done to check for the earliest forms of colorectal cancer.  Routine screening usually begins at age 58.  Direct examination of the colon should be repeated every 5-10 years through 67 years of age. However, you may need to be screened more often if early forms of precancerous polyps or small growths are found. Skin Cancer  Check your skin from head to toe regularly.  Tell your health care provider about any new moles or changes in moles, especially if there is a change in a mole's shape or color.  Also  tell your health care provider if you have a mole that is larger than the size of a pencil eraser.  Always use sunscreen. Apply sunscreen liberally and repeatedly throughout the day.  Protect yourself by wearing long sleeves, pants, a wide-brimmed hat, and sunglasses whenever you are outside. Heart disease, diabetes, and high blood pressure  High blood pressure causes heart disease and increases the risk of stroke. High blood pressure is more likely to develop in:  People who have blood pressure in the high end of the normal range (130-139/85-89 mm Hg).  People who are overweight or obese.  People who are African American.  If you are 45-1 years of age, have your blood pressure checked every 3-5 years. If you are 10 years of age or older, have your blood pressure checked every year. You should have your blood pressure  measured twice-once when you are at a hospital or clinic, and once when you are not at a hospital or clinic. Record the average of the two measurements. To check your blood pressure when you are not at a hospital or clinic, you can use:  An automated blood pressure machine at a pharmacy.  A home blood pressure monitor.  If you are between 22 years and 89 years old, ask your health care provider if you should take aspirin to prevent strokes.  Have regular diabetes screenings. This involves taking a blood sample to check your fasting blood sugar level.  If you are at a normal weight and have a low risk for diabetes, have this test once every three years after 67 years of age.  If you are overweight and have a high risk for diabetes, consider being tested at a younger age or more often. Preventing infection Hepatitis B  If you have a higher risk for hepatitis B, you should be screened for this virus. You are considered at high risk for hepatitis B if:  You were born in a country where hepatitis B is common. Ask your health care provider which countries are considered high risk.  Your parents were born in a high-risk country, and you have not been immunized against hepatitis B (hepatitis B vaccine).  You have HIV or AIDS.  You use needles to inject street drugs.  You live with someone who has hepatitis B.  You have had sex with someone who has hepatitis B.  You get hemodialysis treatment.  You take certain medicines for conditions, including cancer, organ transplantation, and autoimmune conditions. Hepatitis C  Blood testing is recommended for:  Everyone born from [birth year] through 1966.  Anyone with known risk factors for hepatitis C. Sexually transmitted infections (STIs)  You should be screened for sexually transmitted infections (STIs) including gonorrhea and chlamydia if:  You are sexually active and are younger than 67 years of age.  You are older than 67 years of age and  your health care provider tells you that you are at risk for this type of infection.  Your sexual activity has changed since you were last screened and you are at an increased risk for chlamydia or gonorrhea. Ask your health care provider if you are at risk.  If you do not have HIV, but are at risk, it may be recommended that you take a prescription medicine daily to prevent HIV infection. This is called pre-exposure prophylaxis (PrEP). You are considered at risk if:  You are sexually active and do not regularly use condoms or know the HIV status of your partner(s).  You  take drugs by injection.  You are sexually active with a partner who has HIV. Talk with your health care provider about whether you are at high risk of being infected with HIV. If you choose to begin PrEP, you should first be tested for HIV. You should then be tested every 3 months for as long as you are taking PrEP. Pregnancy  If you are premenopausal and you may become pregnant, ask your health care provider about preconception counseling.  If you may become pregnant, take 400 to 800 micrograms (mcg) of folic acid every day.  If you want to prevent pregnancy, talk to your health care provider about birth control (contraception). Osteoporosis and menopause  Osteoporosis is a disease in which the bones lose minerals and strength with aging. This can result in serious bone fractures. Your risk for osteoporosis can be identified using a bone density scan.  If you are 85 years of age or older, or if you are at risk for osteoporosis and fractures, ask your health care provider if you should be screened.  Ask your health care provider whether you should take a calcium or vitamin D supplement to lower your risk for osteoporosis.  Menopause may have certain physical symptoms and risks.  Hormone replacement therapy may reduce some of these symptoms and risks. Talk to your health care provider about whether hormone replacement  therapy is right for you. Follow these instructions at home:  Schedule regular health, dental, and eye exams.  Stay current with your immunizations.  Do not use any tobacco products including cigarettes, chewing tobacco, or electronic cigarettes.  If you are pregnant, do not drink alcohol.  If you are breastfeeding, limit how much and how often you drink alcohol.  Limit alcohol intake to no more than 1 drink per day for nonpregnant women. One drink equals 12 ounces of beer, 5 ounces of wine, or 1 ounces of hard liquor.  Do not use street drugs.  Do not share needles.  Ask your health care provider for help if you need support or information about quitting drugs.  Tell your health care provider if you often feel depressed.  Tell your health care provider if you have ever been abused or do not feel safe at home. This information is not intended to replace advice given to you by your health care provider. Make sure you discuss any questions you have with your health care provider. Document Released: 03/15/2011 Document Revised: 02/05/2016 Document Reviewed: 06/03/2015 Elsevier Interactive Patient Education  2017 [employer] Maintenance for Postmenopausal Women Menopause is a normal process in which your reproductive ability comes to an end. This process happens gradually over a span of months to years, usually between the ages of 82 and 36. Menopause is complete when you have missed 12 consecutive menstrual periods. It is important to talk with your health care provider about some of the most common conditions that affect postmenopausal women, such as heart disease, cancer, and bone loss (osteoporosis). Adopting a healthy lifestyle and getting preventive care can help to promote your health and wellness. Those actions can also lower your chances of developing some of these common conditions. What should I know about menopause? During menopause, you may experience a number of  symptoms, such as:  Moderate-to-severe hot flashes.  Night sweats.  Decrease in sex drive.  Mood swings.  Headaches.  Tiredness.  Irritability.  Memory problems.  Insomnia. Choosing to treat or not to treat menopausal changes is an individual decision  that you make with your health care provider. What should I know about hormone replacement therapy and supplements? Hormone therapy products are effective for treating symptoms that are associated with menopause, such as hot flashes and night sweats. Hormone replacement carries certain risks, especially as you become older. If you are thinking about using estrogen or estrogen with progestin treatments, discuss the benefits and risks with your health care provider. What should I know about heart disease and stroke? Heart disease, heart attack, and stroke become more likely as you age. This may be due, in part, to the hormonal changes that your body experiences during menopause. These can affect how your body processes dietary fats, triglycerides, and cholesterol. Heart attack and stroke are both medical emergencies. There are many things that you can do to help prevent heart disease and stroke:  Have your blood pressure checked at least every 1-2 years. High blood pressure causes heart disease and increases the risk of stroke.  If you are 32-8 years old, ask your health care provider if you should take aspirin to prevent a heart attack or a stroke.  Do not use any tobacco products, including cigarettes, chewing tobacco, or electronic cigarettes. If you need help quitting, ask your health care provider.  It is important to eat a healthy diet and maintain a healthy weight.  Be sure to include plenty of vegetables, fruits, low-fat dairy products, and lean protein.  Avoid eating foods that are high in solid fats, added sugars, or salt (sodium).  Get regular exercise. This is one of the most important things that you can do for your  health.  Try to exercise for at least 150 minutes each week. The type of exercise that you do should increase your heart rate and make you sweat. This is known as moderate-intensity exercise.  Try to do strengthening exercises at least twice each week. Do these in addition to the moderate-intensity exercise.  Know your numbers.Ask your health care provider to check your cholesterol and your blood glucose. Continue to have your blood tested as directed by your health care provider. What should I know about cancer screening? There are several types of cancer. Take the following steps to reduce your risk and to catch any cancer development as early as possible. Breast Cancer  Practice breast self-awareness.  This means understanding how your breasts normally appear and feel.  It also means doing regular breast self-exams. Let your health care provider know about any changes, no matter how small.  If you are 38 or older, have a clinician do a breast exam (clinical breast exam or CBE) every year. Depending on your age, family history, and medical history, it may be recommended that you also have a yearly breast X-ray (mammogram).  If you have a family history of breast cancer, talk with your health care provider about genetic screening.  If you are at high risk for breast cancer, talk with your health care provider about having an MRI and a mammogram every year.  Breast cancer (BRCA) gene test is recommended for women who have family members with BRCA-related cancers. Results of the assessment will determine the need for genetic counseling and BRCA1 and for BRCA2 testing. BRCA-related cancers include these types:  Breast. This occurs in males or females.  Ovarian.  Tubal. This may also be called fallopian tube cancer.  Cancer of the abdominal or pelvic lining (peritoneal cancer).  Prostate.  Pancreatic. Cervical, Uterine, and Ovarian Cancer  Your health care provider may recommend  that you be screened regularly for cancer of the pelvic organs. These include your ovaries, uterus, and vagina. This screening involves a pelvic exam, which includes checking for microscopic changes to the surface of your cervix (Pap test).  For women ages 21-65, health care providers may recommend a pelvic exam and a Pap test every three years. For women ages 1-65, they may recommend the Pap test and pelvic exam, combined with testing for human papilloma virus (HPV), every five years. Some types of HPV increase your risk of cervical cancer. Testing for HPV may also be done on women of any age who have unclear Pap test results.  Other health care providers may not recommend any screening for nonpregnant women who are considered low risk for pelvic cancer and have no symptoms. Ask your health care provider if a screening pelvic exam is right for you.  If you have had past treatment for cervical cancer or a condition that could lead to cancer, you need Pap tests and screening for cancer for at least 20 years after your treatment. If Pap tests have been discontinued for you, your risk factors (such as having a new sexual partner) need to be reassessed to determine if you should start having screenings again. Some women have medical problems that increase the chance of getting cervical cancer. In these cases, your health care provider may recommend that you have screening and Pap tests more often.  If you have a family history of uterine cancer or ovarian cancer, talk with your health care provider about genetic screening.  If you have vaginal bleeding after reaching menopause, tell your health care provider.  There are currently no reliable tests available to screen for ovarian cancer. Lung Cancer  Lung cancer screening is recommended for adults 21-37 years old who are at high risk for lung cancer because of a history of smoking. A yearly low-dose CT scan of the lungs is recommended if you:  Currently  smoke.  Have a history of at least 30 pack-years of smoking and you currently smoke or have quit within the past 15 years. A pack-year is smoking an average of one pack of cigarettes per day for one year. Yearly screening should:  Continue until it has been 15 years since you quit.  Stop if you develop a health problem that would prevent you from having lung cancer treatment. Colorectal Cancer  This type of cancer can be detected and can often be prevented.  Routine colorectal cancer screening usually begins at age 34 and continues through age 77.  If you have risk factors for colon cancer, your health care provider may recommend that you be screened at an earlier age.  If you have a family history of colorectal cancer, talk with your health care provider about genetic screening.  Your health care provider may also recommend using home test kits to check for hidden blood in your stool.  A small camera at the end of a tube can be used to examine your colon directly (sigmoidoscopy or colonoscopy). This is done to check for the earliest forms of colorectal cancer.  Direct examination of the colon should be repeated every 5-10 years until age 62. However, if early forms of precancerous polyps or small growths are found or if you have a family history or genetic risk for colorectal cancer, you may need to be screened more often. Skin Cancer  Check your skin from head to toe regularly.  Monitor any moles. Be sure to tell your  health care provider:  About any new moles or changes in moles, especially if there is a change in a mole's shape or color.  If you have a mole that is larger than the size of a pencil eraser.  If any of your family members has a history of skin cancer, especially at a young age, talk with your health care provider about genetic screening.  Always use sunscreen. Apply sunscreen liberally and repeatedly throughout the day.  Whenever you are outside, protect  yourself by wearing long sleeves, pants, a wide-brimmed hat, and sunglasses. What should I know about osteoporosis? Osteoporosis is a condition in which bone destruction happens more quickly than new bone creation. After menopause, you may be at an increased risk for osteoporosis. To help prevent osteoporosis or the bone fractures that can happen because of osteoporosis, the following is recommended:  If you are 65-23 years old, get at least 1,000 mg of calcium and at least 600 mg of vitamin D per day.  If you are older than age 1 but younger than age 34, get at least 1,200 mg of calcium and at least 600 mg of vitamin D per day.  If you are older than age 20, get at least 1,200 mg of calcium and at least 800 mg of vitamin D per day. Smoking and excessive alcohol intake increase the risk of osteoporosis. Eat foods that are rich in calcium and vitamin D, and do weight-bearing exercises several times each week as directed by your health care provider. What should I know about how menopause affects my mental health? Depression may occur at any age, but it is more common as you become older. Common symptoms of depression include:  Low or sad mood.  Changes in sleep patterns.  Changes in appetite or eating patterns.  Feeling an overall lack of motivation or enjoyment of activities that you previously enjoyed.  Frequent crying spells. Talk with your health care provider if you think that you are experiencing depression. What should I know about immunizations? It is important that you get and maintain your immunizations. These include:  Tetanus, diphtheria, and pertussis (Tdap) booster vaccine.  Influenza every year before the flu season begins.  Pneumonia vaccine.  Shingles vaccine. Your health care provider may also recommend other immunizations. This information is not intended to replace advice given to you by your health care provider. Make sure you discuss any questions you have with  your health care provider. Document Released: 10/22/2005 Document Revised: 03/19/2016 Document Reviewed: 06/03/2015 Elsevier Interactive Patient Education  2017 [employer]. Pneumococcal Vaccine, Polyvalent solution for injection What is this medicine? PNEUMOCOCCAL VACCINE, POLYVALENT (NEU mo KOK al vak SEEN, pol ee VEY luhnt) is a vaccine to prevent pneumococcus bacteria infection. These bacteria are a major cause of ear infections, Strep throat infections, and serious pneumonia, meningitis, or blood infections worldwide. These vaccines help the body to produce antibodies (protective substances) that help your body defend against these bacteria. This vaccine is recommended for people 79 years of age and older with health problems. It is also recommended for all adults over 57 years old. This vaccine will not treat an infection. This medicine may be used for other purposes; ask your health care provider or pharmacist if you have questions. COMMON BRAND NAME(S): Pneumovax 23 What should I tell my health care provider before I take this medicine? They need to know if you have any of these conditions: -bleeding problems -bone marrow or organ transplant -cancer, Hodgkin's disease -fever -  infection -immune system problems -low platelet count in the blood -seizures -an unusual or allergic reaction to pneumococcal vaccine, diphtheria toxoid, other vaccines, latex, other medicines, foods, dyes, or preservatives -pregnant or trying to get pregnant -breast-feeding How should I use this medicine? This vaccine is for injection into a muscle or under the skin. It is given by a health care professional. A copy of Vaccine Information Statements will be given before each vaccination. Read this sheet carefully each time. The sheet may change frequently. Talk to your pediatrician regarding the use of this medicine in children. While this drug may be prescribed for children as young as 61 years of age for  selected conditions, precautions do apply. Overdosage: If you think you have taken too much of this medicine contact a poison control center or emergency room at once. NOTE: This medicine is only for you. Do not share this medicine with others. What if I miss a dose? It is important not to miss your dose. Call your doctor or health care professional if you are unable to keep an appointment. What may interact with this medicine? -medicines for cancer chemotherapy -medicines that suppress your immune function -medicines that treat or prevent blood clots like warfarin, enoxaparin, and dalteparin -steroid medicines like prednisone or cortisone This list may not describe all possible interactions. Give your health care provider a list of all the medicines, herbs, non-prescription drugs, or dietary supplements you use. Also tell them if you smoke, drink alcohol, or use illegal drugs. Some items may interact with your medicine. What should I watch for while using this medicine? Mild fever and pain should go away in 3 days or less. Report any unusual symptoms to your doctor or health care professional. What side effects may I notice from receiving this medicine? Side effects that you should report to your doctor or health care professional as soon as possible: -allergic reactions like skin rash, itching or hives, swelling of the face, lips, or tongue -breathing problems -confused -fever over 102 degrees F -pain, tingling, numbness in the hands or feet -seizures -unusual bleeding or bruising -unusual muscle weakness Side effects that usually do not require medical attention (report to your doctor or health care professional if they continue or are bothersome): -aches and pains -diarrhea -fever of 102 degrees F or less -headache -irritable -loss of appetite -pain, tender at site where injected -trouble sleeping This list may not describe all possible side effects. Call your doctor for medical  advice about side effects. You may report side effects to FDA at 1-800-FDA-1088. Where should I keep my medicine? This does not apply. This vaccine is given in a clinic, pharmacy, doctor's office, or other health care setting and will not be stored at home. NOTE: This sheet is a summary. It may not cover all possible information. If you have questions about this medicine, talk to your doctor, pharmacist, or health care provider.  2018 Elsevier/Gold Standard (2008-04-05 14:32:37) Pneumococcal Polysaccharide Vaccine: What You Need to Know 1. Why get vaccinated? Vaccination can protect older adults (and some children and younger adults) from pneumococcal disease. Pneumococcal disease is caused by bacteria that can spread from person to person through close contact. It can cause ear infections, and it can also lead to more serious infections of the:  Lungs (pneumonia),  Blood (bacteremia), and  Covering of the brain and spinal cord (meningitis). Meningitis can cause deafness and brain damage, and it can be fatal. Anyone can get pneumococcal disease, but children under 2  years of age, people with certain medical conditions, adults over 33 years of age, and cigarette smokers are at the highest risk. About 18,000 older adults die each year from pneumococcal disease in the [place]. Treatment of pneumococcal infections with penicillin and other drugs used to be more effective. But some strains of the disease have become resistant to these drugs. This makes prevention of the disease, through vaccination, even more important. 2. Pneumococcal polysaccharide vaccine (PPSV23) Pneumococcal polysaccharide vaccine (PPSV23) protects against 23 types of pneumococcal bacteria. It will not prevent all pneumococcal disease. PPSV23 is recommended for:  All adults 70 years of age and older,  Anyone 2 through 67 years of age with certain long-term health problems,  Anyone 2 through 67 years of age with a  weakened immune system,  Adults 5 through 67 years of age who smoke cigarettes or have asthma. Most people need only one dose of PPSV. A second dose is recommended for certain high-risk groups. People 53 and older should get a dose even if they have gotten one or more doses of the vaccine before they turned 65. Your healthcare provider can give you more information about these recommendations. Most healthy adults develop protection within 2 to 3 weeks of getting the shot. 3. Some people should not get this vaccine  Anyone who has had a life-threatening allergic reaction to PPSV should not get another dose.  Anyone who has a severe allergy to any component of PPSV should not receive it. Tell your provider if you have any severe allergies.  Anyone who is moderately or severely ill when the shot is scheduled may be asked to wait until they recover before getting the vaccine. Someone with a mild illness can usually be vaccinated.  Children less than 69 years of age should not receive this vaccine.  There is no evidence that PPSV is harmful to either a pregnant woman or to her fetus. However, as a precaution, women who need the vaccine should be vaccinated before becoming pregnant, if possible. 4. Risks of a vaccine reaction With any medicine, including vaccines, there is a chance of side effects. These are usually mild and go away on their own, but serious reactions are also possible. About half of people who get PPSV have mild side effects, such as redness or pain where the shot is given, which go away within about two days. Less than 1 out of 100 people develop a fever, muscle aches, or more severe local reactions. Problems that could happen after any vaccine:   People sometimes faint after a medical procedure, including vaccination. Sitting or lying down for about 15 minutes can help prevent fainting, and injuries caused by a fall. Tell your doctor if you feel dizzy, or have vision changes or  ringing in the ears.  Some people get severe pain in the shoulder and have difficulty moving the arm where a shot was given. This happens very rarely.  Any medication can cause a severe allergic reaction. Such reactions from a vaccine are very rare, estimated at about 1 in a million doses, and would happen within a few minutes to a few hours after the vaccination. As with any medicine, there is a very remote chance of a vaccine causing a serious injury or death. The safety of vaccines is always being monitored. For more information, visit: http://www.aguilar.org/ 5. What if there is a serious reaction? What should I look for?  Look for anything that concerns you, such as signs of a severe  allergic reaction, very high fever, or unusual behavior. Signs of a severe allergic reaction can include hives, swelling of the face and throat, difficulty breathing, a fast heartbeat, dizziness, and weakness. These would usually start a few minutes to a few hours after the vaccination. What should I do?  If you think it is a severe allergic reaction or other emergency that can't wait, call 9-1-1 or get to the nearest hospital. Otherwise, call your doctor. Afterward, the reaction should be reported to the Vaccine Adverse Event Reporting System (VAERS). Your doctor might file this report, or you can do it yourself through the VAERS web site at www.vaers.SamedayNews.es, or by calling 651-222-8087. VAERS does not give medical advice.  6. How can I learn more?  Ask your doctor. He or she can give you the vaccine package insert or suggest other sources of information.  Call your local or state health department.  Contact the Centers for Disease Control and Prevention (CDC):  Call 425-713-5343 (1-800-CDC-INFO) or  Visit CDC's website at http://hunter.com/ CDC Pneumococcal Polysaccharide Vaccine VIS (01/04/14) This information is not intended to replace advice given to you by your health care provider. Make  sure you discuss any questions you have with your health care provider. Document Released: 06/27/2006 Document Revised: 05/20/2016 Document Reviewed: 05/20/2016 Elsevier Interactive Patient Education  2017 [employer].  f you have chronic pain and are looking for alternatives to medication and surgery, you have a lot of options.   However, not all alternative pain treatments work. Some can even be risky. Some alternative treatments may help with pain from bad backs, osteoarthritis, and headaches, but have no effect on chronic pain from fibromyalgia or diabetic nerve damage.  Here's a rundown of the most commonly used alternative treatments for chronic pain.  Acupuncture. Once seen as bizarre, acupuncture is rapidly becoming a mainstream treatment for pain. Studies have found that it works for pain caused by many conditions, including fibromyalgia, osteoarthritis, back injuries, and sports injuries. How does it work? No one's quite sure. It could release pain-numbing chemicals in the body. Or it might block the pain signals coming from the nerves.  Exercise. Motion is lotion Going for a walk or swim isn't a treatment, exactly. But regular physical activity has big benefits for people with many different painful conditions. Study after study has found that physical activity can help relieve chronic pain, as well as boost energy and mood.   This is particularly true of pain related to arthritis .    Chiropractic manipulation. Although mainstream medicine has traditionally regarded spinal manipulation with suspicion, it's becoming a more accepted treatment.  I think many people respond will th chiropractic treatment.    Supplements and vitamins. There is evidence that certain dietary supplements and vitamins can help with certain types of pain. Fish oil and flaxseed oil is often used to reduce pain associated with swelling. Topical capsaicin, derived from chili peppers, may help with arthritis,  diabetic nerve pain, and other conditions. There's evidence that glucosamine can help relieve moderate to severe pain from osteoarthritis in the knee.  A spice Tumeric is used my many to treat chronic pain.  Curcumin is the active ingredient and thought to have anti-inflammatory effects and reduces pain and stiffness.  This can be found as capsules or extract (more likely to be free of contaminants). For OA: Capsule, typically 400 mg to 600 mg, three times per day; or 0.5 g to 1 g of powdered root up to 3 g per day.  For RA: 500 mg twice daily.  It's best absorbed if it contains black pepper.  Tart cherry juice is a natural anti-inflammatory agent.  We sometimes hear from people who would like to know where to find cherries out of season. Some report that their local market does not carry cherry juice. There are a number of reputable online vendors who could supply cherry concentrate so you can use cherry juice to see if it helps your arthritic joints. Studies have been done with powdered [place] cherries, CherryPURE. The usual dose is 480 mg/day. Manufacturers often combine several ingredients in 1 pill.  One of my patient is taking [employer] Fibro support she finds helpful.  Another I know is Zyflamed.  Please let me know if you find any products helpful for you that I can pass on to others  But when it comes to supplements, you have to be careful. High doses of B6 can damage the nerves.   Some studies suggest that supplements such as ginkgo biloba and ginseng can thin the blood and increase the risk of bleeding. This could lead to serious consequences for anyone getting surgery for chronic pain.  Finally, supplements don't always contain what they claim as supplement manufacturers are not regulated by the FDA.  I get very confused with the thousands of supplements available and which to choose.  Brands to consider: [employer], Nature[employer], [employer], Garden of Life, [employer], Duwayne Heck and  Tresa Garter   I usually go on [employer] and look for the highly rated products.  [employer] or [employer] are great resources and have done the research for you.     Cognitive Behavioral Therapy. Some people with chronic pain balk at the idea of seeing a therapist -- they think it implies that their pain isn't real. But studies show that depression and chronic pain often go together. Chronic pain can cause or worsen depression; depression can lower a person's tolerance for pain.  Stress-reduction techniques. There are number of approaches, including: Yoga. There's good evidence that yoga can help with chronic pain,  specifically fibromyalgia, neck pain, back pain, and arthritis. Relaxation therapy. This is actually a category of techniques that help people calm the body and release tension -- a process that might also reduce pain. Some approaches teach people how to focus on their breathing. Research shows that relaxation therapy can help with fibromyalgia, headache, osteoarthritis, and other conditions. Hypnosis. Studies have found this approach helpful with different sorts of pain, like back pain, repetitive strain injuries, and cancer pain. Guided imagery. Research shows that guided imagery can help with conditions like headache pain, cancer pain, osteoarthritis, and fibromyalgia. How does it work? An expert would teach you ways to direct your thoughts by focusing on specific images. Music therapy. This approach gets people to either perform or listen to music. Studies have found that it can help with many different pain conditions, like osteoarthritis and cancer pain. Biofeedback. This approach teaches you how to control normally unconscious bodily functions, like blood pressure or your heart rate. Studies have found that it can help with headaches, fibromyalgia, and other conditions. Massage. It's undeniably relaxing. And there's some evidence that massage can help ease pain from rheumatoid  arthritis, neck and back injuries, and fibromyalgia.  Risky Alternative Pain Treatments Experts say you should keep your expectations for alternative pain treatments modest -- especially when it comes to "miracle cures." Controlling chronic pain is not simple. A single supplement, device, or treatment is not going  to make your chronic pain disappear. You a need to be suspicious of anyone pushing a treatment when the financial motive is blatant. That doesn't only apply to pain treatments advertised on dubious web sites asking for your credit card number.  Experts say that you should try to keep up to date with research into alternative treatments for chronic pain. The options for people with chronic pain are always growing -- and some of the older treatments of today might become the mainstream treatments of tomorrow.

## 2016-12-29 ENCOUNTER — Encounter: Payer: Self-pay | Admitting: Family Medicine

## 2016-12-29 LAB — CBC WITH DIFFERENTIAL/PLATELET
Basophils Absolute: 0 10*3/uL (ref 0.0–0.2)
Basos: 0 %
EOS (ABSOLUTE): 0.2 10*3/uL (ref 0.0–0.4)
EOS: 2 %
HEMATOCRIT: 44.3 % (ref 34.0–46.6)
HEMOGLOBIN: 14.7 g/dL (ref 11.1–15.9)
IMMATURE GRANS (ABS): 0 10*3/uL (ref 0.0–0.1)
IMMATURE GRANULOCYTES: 0 %
LYMPHS: 21 %
Lymphocytes Absolute: 1.8 10*3/uL (ref 0.7–3.1)
MCH: 30.7 pg (ref 26.6–33.0)
MCHC: 33.2 g/dL (ref 31.5–35.7)
MCV: 93 fL (ref 79–97)
MONOCYTES: 8 %
Monocytes Absolute: 0.7 10*3/uL (ref 0.1–0.9)
NEUTROS ABS: 5.8 10*3/uL (ref 1.4–7.0)
NEUTROS PCT: 69 %
PLATELETS: 247 10*3/uL (ref 150–379)
RBC: 4.79 x10E6/uL (ref 3.77–5.28)
RDW: 13.9 % (ref 12.3–15.4)
WBC: 8.4 10*3/uL (ref 3.4–10.8)

## 2016-12-29 LAB — COMPREHENSIVE METABOLIC PANEL
A/G RATIO: 1.9 (ref 1.2–2.2)
ALBUMIN: 5 g/dL — AB (ref 3.6–4.8)
ALT: 25 IU/L (ref 0–32)
AST: 27 IU/L (ref 0–40)
Alkaline Phosphatase: 89 IU/L (ref 39–117)
BUN / CREAT RATIO: 23 (ref 12–28)
BUN: 15 mg/dL (ref 8–27)
Bilirubin Total: 0.4 mg/dL (ref 0.0–1.2)
CO2: 26 mmol/L (ref 18–29)
Calcium: 10.7 mg/dL — ABNORMAL HIGH (ref 8.7–10.3)
Chloride: 97 mmol/L (ref 96–106)
Creatinine, Ser: 0.66 mg/dL (ref 0.57–1.00)
GFR, EST AFRICAN AMERICAN: 106 mL/min/{1.73_m2} (ref >59)
GFR, EST NON AFRICAN AMERICAN: 92 mL/min/{1.73_m2} (ref >59)
GLOBULIN, TOTAL: 2.7 g/dL (ref 1.5–4.5)
Glucose: 93 mg/dL (ref 65–99)
POTASSIUM: 4.1 mmol/L (ref 3.5–5.2)
Sodium: 140 mmol/L (ref 134–144)
TOTAL PROTEIN: 7.7 g/dL (ref 6.0–8.5)

## 2016-12-29 LAB — MICROALBUMIN, URINE WAIVED
Creatinine, Urine Waived: 50 mg/dL (ref 10–300)
Microalb, Ur Waived: 10 mg/L (ref 0–19)

## 2016-12-29 LAB — LIPID PANEL W/O CHOL/HDL RATIO
Cholesterol, Total: 176 mg/dL (ref 100–199)
HDL: 54 mg/dL (ref >39)
LDL Calculated: 88 mg/dL (ref 0–99)
Triglycerides: 170 mg/dL — ABNORMAL HIGH (ref 0–149)
VLDL Cholesterol Cal: 34 mg/dL (ref 5–40)

## 2016-12-29 LAB — UA/M W/RFLX CULTURE, ROUTINE
BILIRUBIN UA: NEGATIVE
GLUCOSE, UA: NEGATIVE
KETONES UA: NEGATIVE
Nitrite, UA: NEGATIVE
Protein, UA: NEGATIVE
RBC UA: NEGATIVE
SPEC GRAV UA: 1.01 (ref 1.005–1.030)
UUROB: 0.2 mg/dL (ref 0.2–1.0)
pH, UA: 6 (ref 5.0–7.5)

## 2016-12-29 LAB — MICROSCOPIC EXAMINATION
Bacteria, UA: NONE SEEN
RBC, UA: NONE SEEN /hpf (ref 0–?)
WBC UA: NONE SEEN /HPF (ref 0–?)

## 2016-12-29 LAB — TSH: TSH: 0.68 u[IU]/mL (ref 0.450–4.500)

## 2016-12-29 LAB — VITAMIN D 25 HYDROXY (VIT D DEFICIENCY, FRACTURES): Vit D, 25-Hydroxy: 36.7 ng/mL (ref 30.0–100.0)

## 2017-03-17 DIAGNOSIS — L089 Local infection of the skin and subcutaneous tissue, unspecified: Secondary | ICD-10-CM | POA: Diagnosis not present

## 2017-03-17 DIAGNOSIS — T148XXA Other injury of unspecified body region, initial encounter: Secondary | ICD-10-CM | POA: Diagnosis not present

## 2017-03-17 DIAGNOSIS — S91114A Laceration without foreign body of right lesser toe(s) without damage to nail, initial encounter: Secondary | ICD-10-CM | POA: Diagnosis not present

## 2017-03-18 DIAGNOSIS — H43813 Vitreous degeneration, bilateral: Secondary | ICD-10-CM | POA: Diagnosis not present

## 2017-05-19 ENCOUNTER — Other Ambulatory Visit: Payer: Self-pay | Admitting: Family Medicine

## 2017-05-19 DIAGNOSIS — I129 Hypertensive chronic kidney disease with stage 1 through stage 4 chronic kidney disease, or unspecified chronic kidney disease: Secondary | ICD-10-CM

## 2017-07-05 ENCOUNTER — Ambulatory Visit (INDEPENDENT_AMBULATORY_CARE_PROVIDER_SITE_OTHER): Payer: Medicare Other | Admitting: Family Medicine

## 2017-07-05 ENCOUNTER — Encounter: Payer: Self-pay | Admitting: Family Medicine

## 2017-07-05 VITALS — BP 136/81 | HR 58 | Temp 97.8°F | Wt 230.2 lb

## 2017-07-05 DIAGNOSIS — F325 Major depressive disorder, single episode, in full remission: Secondary | ICD-10-CM

## 2017-07-05 DIAGNOSIS — E7800 Pure hypercholesterolemia, unspecified: Secondary | ICD-10-CM | POA: Diagnosis not present

## 2017-07-05 DIAGNOSIS — I129 Hypertensive chronic kidney disease with stage 1 through stage 4 chronic kidney disease, or unspecified chronic kidney disease: Secondary | ICD-10-CM | POA: Diagnosis not present

## 2017-07-05 MED ORDER — OXYBUTYNIN CHLORIDE 5 MG PO TABS: 5.0000 mg | ORAL_TABLET | Freq: Two times a day (BID) | ORAL | 1 refills | Status: DC

## 2017-07-05 MED ORDER — AMLODIPINE BESYLATE 5 MG PO TABS: 5.0000 mg | ORAL_TABLET | Freq: Every day | ORAL | 1 refills | Status: DC

## 2017-07-05 MED ORDER — METOPROLOL SUCCINATE ER 100 MG PO TB24: 100.0000 mg | ORAL_TABLET | Freq: Every day | ORAL | 1 refills | Status: DC

## 2017-07-05 MED ORDER — ATORVASTATIN CALCIUM 20 MG PO TABS: 20.0000 mg | ORAL_TABLET | Freq: Every day | ORAL | 1 refills | Status: DC

## 2017-07-05 MED ORDER — VALSARTAN-HYDROCHLOROTHIAZIDE 160-25 MG PO TABS: 1.0000 | ORAL_TABLET | Freq: Every day | ORAL | 1 refills | Status: DC

## 2017-07-05 NOTE — Progress Notes (Signed)
BP 136/81 (BP Location: Left Arm, Patient Position: Sitting, Cuff Size: Normal)   Pulse (!) 58   Temp 97.8 F (36.6 C)   Wt 230 lb 3.2 oz (104.4 kg)   LMP  (LMP Unknown)   SpO2 100%   BMI 42.79 kg/m    Subjective:    Patient ID: Anne Bell, female    DOB: 23-Dec-1950, 66 y.o.   MRN: [id number]  HPI: Anne Bell is a 66 y.o. female  Chief Complaint  Patient presents with  . Hypertension  . Hyperlipidemia   HYPERTENSION / HYPERLIPIDEMIA Satisfied with current treatment? yes Duration of hypertension: chronic BP monitoring frequency: not checking BP medication side effects: no Duration of hyperlipidemia: chronic Cholesterol medication side effects: no Cholesterol supplements: none Past cholesterol medications: atorvastatin Medication compliance: excellent compliance Aspirin: no Recent stressors: no Recurrent headaches: no Visual changes: no Palpitations: no Dyspnea: no Chest pain: no Lower extremity edema: no Dizzy/lightheaded: no  DEPRESSION Mood status: controlled Satisfied with current treatment?: yes Symptom severity: mild  Duration of current treatment : not on anything Depressed mood: no Anxious mood: no Anhedonia: no Significant weight loss or gain: no Insomnia: no  Fatigue: yes Feelings of worthlessness or guilt: no Impaired concentration/indecisiveness: no Suicidal ideations: no Hopelessness: no Crying spells: no Depression screen [facility] 2/9 07/05/2017 12/28/2016 08/21/2015 05/30/2015  Decreased Interest 0 0 0 0  Down, Depressed, Hopeless 0 0 0 0  PHQ - 2 Score 0 0 0 0  Altered sleeping 0 - 2 -  Tired, decreased energy 0 - 0 -  Change in appetite 0 - 0 -  Feeling bad or failure about yourself  0 - 0 -  Trouble concentrating 0 - 0 -  Moving slowly or fidgety/restless 0 - 0 -  Suicidal thoughts 0 - 0 -  PHQ-9 Score 0 - 2 -  Difficult doing work/chores Not difficult at all - Not difficult at all -    Relevant past medical, surgical, family  and social history reviewed and updated as indicated. Interim medical history since our last visit reviewed. Allergies and medications reviewed and updated.  Review of Systems  Constitutional: Negative.   Respiratory: Negative.   Cardiovascular: Negative.   Gastrointestinal: Negative.   Psychiatric/Behavioral: Negative.     Per HPI unless specifically indicated above     Objective:    BP 136/81 (BP Location: Left Arm, Patient Position: Sitting, Cuff Size: Normal)   Pulse (!) 58   Temp 97.8 F (36.6 C)   Wt 230 lb 3.2 oz (104.4 kg)   LMP  (LMP Unknown)   SpO2 100%   BMI 42.79 kg/m   Wt Readings from Last 3 Encounters:  07/05/17 230 lb 3.2 oz (104.4 kg)  12/28/16 234 lb 6.4 oz (106.3 kg)  02/24/16 241 lb (109.3 kg)    Physical Exam  Constitutional: She is oriented to person, place, and time. She appears well-developed and well-nourished. No distress.  HENT:  Head: Normocephalic and atraumatic.  Right Ear: Hearing normal.  Left Ear: Hearing normal.  Nose: Nose normal.  Eyes: Conjunctivae and lids are normal. Right eye exhibits no discharge. Left eye exhibits no discharge. No scleral icterus.  Cardiovascular: Normal rate, regular rhythm, normal heart sounds and intact distal pulses.  Exam reveals no gallop and no friction rub.   No murmur heard. Pulmonary/Chest: Effort normal and breath sounds normal. No respiratory distress. She has no wheezes. She has no rales. She exhibits no tenderness.  Musculoskeletal: Normal range of  motion.  Neurological: She is alert and oriented to person, place, and time.  Skin: Skin is warm, dry and intact. No rash noted. She is not diaphoretic. No erythema. No pallor.  Psychiatric: She has a normal mood and affect. Her speech is normal and behavior is normal. Judgment and thought content normal. Cognition and memory are normal.    Results for orders placed or performed in visit on 12/28/16  Microscopic Examination  Result Value Ref Range    WBC, UA None seen 0 - 5 /hpf   RBC, UA None seen 0 - 2 /hpf   Epithelial Cells (non renal) 0-10 0 - 10 /hpf   Bacteria, UA None seen None seen/Few  CBC with Differential/Platelet  Result Value Ref Range   WBC 8.4 3.4 - 10.8 x10E3/uL   RBC 4.79 3.77 - 5.28 x10E6/uL   Hemoglobin 14.7 11.1 - 15.9 g/dL   Hematocrit 62.144.3 30.834.0 - 46.6 %   MCV 93 79 - 97 fL   MCH 30.7 26.6 - 33.0 pg   MCHC 33.2 31.5 - 35.7 g/dL   RDW 65.713.9 84.612.3 - 96.215.4 %   Platelets 247 150 - 379 x10E3/uL   Neutrophils 69 Not Estab. %   Lymphs 21 Not Estab. %   Monocytes 8 Not Estab. %   Eos 2 Not Estab. %   Basos 0 Not Estab. %   Neutrophils Absolute 5.8 1.4 - 7.0 x10E3/uL   Lymphocytes Absolute 1.8 0.7 - 3.1 x10E3/uL   Monocytes Absolute 0.7 0.1 - 0.9 x10E3/uL   EOS (ABSOLUTE) 0.2 0.0 - 0.4 x10E3/uL   Basophils Absolute 0.0 0.0 - 0.2 x10E3/uL   Immature Granulocytes 0 Not Estab. %   Immature Grans (Abs) 0.0 0.0 - 0.1 x10E3/uL  Comprehensive metabolic panel  Result Value Ref Range   Glucose 93 65 - 99 mg/dL   BUN 15 8 - 27 mg/dL   Creatinine, Ser 9.520.66 0.57 - 1.00 mg/dL   GFR calc non Af Amer 92 >59 mL/min/1.73   GFR calc Af Amer 106 >59 mL/min/1.73   BUN/Creatinine Ratio 23 12 - 28   Sodium 140 134 - 144 mmol/L   Potassium 4.1 3.5 - 5.2 mmol/L   Chloride 97 96 - 106 mmol/L   CO2 26 18 - 29 mmol/L   Calcium 10.7 (H) 8.7 - 10.3 mg/dL   Total Protein 7.7 6.0 - 8.5 g/dL   Albumin 5.0 (H) 3.6 - 4.8 g/dL   Globulin, Total 2.7 1.5 - 4.5 g/dL   Albumin/Globulin Ratio 1.9 1.2 - 2.2   Bilirubin Total 0.4 0.0 - 1.2 mg/dL   Alkaline Phosphatase 89 39 - 117 IU/L   AST 27 0 - 40 IU/L   ALT 25 0 - 32 IU/L  Lipid Panel w/o Chol/HDL Ratio  Result Value Ref Range   Cholesterol, Total 176 100 - 199 mg/dL   Triglycerides 841170 (H) 0 - 149 mg/dL   HDL 54 >32>39 mg/dL   VLDL Cholesterol Cal 34 5 - 40 mg/dL   LDL Calculated 88 0 - 99 mg/dL  Microalbumin, Urine Waived  Result Value Ref Range   Microalb, Ur Waived 10 0 - 19 mg/L    Creatinine, Urine Waived 50 10 - 300 mg/dL   Microalb/Creat Ratio 30-300 (H) <30 mg/g  TSH  Result Value Ref Range   TSH 0.680 0.450 - 4.500 uIU/mL  UA/M w/rflx Culture, Routine  Result Value Ref Range   Specific Gravity, UA 1.010 1.005 - 1.030   pH, UA  6.0 5.0 - 7.5   Color, UA Yellow Yellow   Appearance Ur Clear Clear   Leukocytes, UA Trace (A) Negative   Protein, UA Negative Negative/Trace   Glucose, UA Negative Negative   Ketones, UA Negative Negative   RBC, UA Negative Negative   Bilirubin, UA Negative Negative   Urobilinogen, Ur 0.2 0.2 - 1.0 mg/dL   Nitrite, UA Negative Negative   Microscopic Examination See below:   VITAMIN D 25 Hydroxy (Vit-D Deficiency, Fractures)  Result Value Ref Range   Vit D, 25-Hydroxy 36.7 30.0 - 100.0 ng/mL      Assessment & Plan:   Problem List Items Addressed This Visit      Genitourinary   Benign hypertensive renal disease - Primary    Under good control. Continue current regimen. Continue to monitor. Call with any concerns.       Relevant Medications   amLODipine (NORVASC) 5 MG tablet   metoprolol succinate (TOPROL-XL) 100 MG 24 hr tablet   valsartan-hydrochlorothiazide (DIOVAN-HCT) 160-25 MG tablet   Other Relevant Orders   Comprehensive metabolic panel     Other   Major depression in remission (HCC)    Under good control off all medication. Continue to monitor. Call with any concerns.       Hypercholesteremia    Under good control. Continue current regimen. Continue to monitor. Call with any concerns.       Relevant Medications   amLODipine (NORVASC) 5 MG tablet   atorvastatin (LIPITOR) 20 MG tablet   metoprolol succinate (TOPROL-XL) 100 MG 24 hr tablet   valsartan-hydrochlorothiazide (DIOVAN-HCT) 160-25 MG tablet   Other Relevant Orders   Comprehensive metabolic panel   Lipid Panel w/o Chol/HDL Ratio       Follow up plan: Return in about 6 months (around 01/03/2018) for Physical/Wellness.

## 2017-07-05 NOTE — Assessment & Plan Note (Signed)
Under good control. Continue current regimen. Continue to monitor. Call with any concerns. 

## 2017-07-05 NOTE — Assessment & Plan Note (Signed)
Under good control off all medication. Continue to monitor. Call with any concerns.

## 2017-07-06 ENCOUNTER — Encounter: Payer: Self-pay | Admitting: Family Medicine

## 2017-07-06 LAB — COMPREHENSIVE METABOLIC PANEL
ALBUMIN: 4.7 g/dL (ref 3.6–4.8)
ALK PHOS: 88 IU/L (ref 39–117)
ALT: 26 IU/L (ref 0–32)
AST: 23 IU/L (ref 0–40)
Albumin/Globulin Ratio: 1.7 (ref 1.2–2.2)
BILIRUBIN TOTAL: 0.5 mg/dL (ref 0.0–1.2)
BUN / CREAT RATIO: 29 — AB (ref 12–28)
BUN: 20 mg/dL (ref 8–27)
CHLORIDE: 99 mmol/L (ref 96–106)
CO2: 25 mmol/L (ref 20–29)
Calcium: 10.6 mg/dL — ABNORMAL HIGH (ref 8.7–10.3)
Creatinine, Ser: 0.7 mg/dL (ref 0.57–1.00)
GFR calc Af Amer: 104 mL/min/{1.73_m2} (ref >59)
GFR calc non Af Amer: 90 mL/min/{1.73_m2} (ref >59)
GLUCOSE: 99 mg/dL (ref 65–99)
Globulin, Total: 2.7 g/dL (ref 1.5–4.5)
Potassium: 4.2 mmol/L (ref 3.5–5.2)
SODIUM: 141 mmol/L (ref 134–144)
Total Protein: 7.4 g/dL (ref 6.0–8.5)

## 2017-07-06 LAB — LIPID PANEL W/O CHOL/HDL RATIO
CHOLESTEROL TOTAL: 161 mg/dL (ref 100–199)
HDL: 57 mg/dL (ref >39)
LDL Calculated: 75 mg/dL (ref 0–99)
Triglycerides: 143 mg/dL (ref 0–149)
VLDL CHOLESTEROL CAL: 29 mg/dL (ref 5–40)

## 2017-07-08 ENCOUNTER — Telehealth: Payer: Self-pay | Admitting: Family Medicine

## 2017-07-08 DIAGNOSIS — J301 Allergic rhinitis due to pollen: Secondary | ICD-10-CM

## 2017-07-08 DIAGNOSIS — I129 Hypertensive chronic kidney disease with stage 1 through stage 4 chronic kidney disease, or unspecified chronic kidney disease: Secondary | ICD-10-CM

## 2017-07-08 DIAGNOSIS — E7800 Pure hypercholesterolemia, unspecified: Secondary | ICD-10-CM

## 2017-07-08 MED ORDER — ATORVASTATIN CALCIUM 20 MG PO TABS: 20.0000 mg | ORAL_TABLET | Freq: Every day | ORAL | 1 refills | Status: DC

## 2017-07-08 MED ORDER — METOPROLOL SUCCINATE ER 100 MG PO TB24: 100.0000 mg | ORAL_TABLET | Freq: Every day | ORAL | 1 refills | Status: DC

## 2017-07-08 MED ORDER — OXYBUTYNIN CHLORIDE 5 MG PO TABS: 5.0000 mg | ORAL_TABLET | Freq: Two times a day (BID) | ORAL | 1 refills | Status: DC

## 2017-07-08 MED ORDER — AMLODIPINE BESYLATE 5 MG PO TABS: 5.0000 mg | ORAL_TABLET | Freq: Every day | ORAL | 1 refills | Status: DC

## 2017-07-08 MED ORDER — VALSARTAN-HYDROCHLOROTHIAZIDE 160-25 MG PO TABS: 1.0000 | ORAL_TABLET | Freq: Every day | ORAL | 1 refills | Status: DC

## 2017-07-08 MED ORDER — FLUTICASONE PROPIONATE 50 MCG/ACT NA SUSP: 2.0000 | Freq: Every day | NASAL | 12 refills | Status: DC

## 2017-07-08 NOTE — Telephone Encounter (Signed)
Please see the patient's request to redirect medication refills on recent order, to the [facility].  [facility]  Thank you.

## 2017-07-08 NOTE — Telephone Encounter (Signed)
Rxs sent to her pharmacy 

## 2017-07-08 NOTE — Telephone Encounter (Signed)
Routing to provider  

## 2017-07-08 NOTE — Telephone Encounter (Signed)
Copied from CRM [phone]. Topic: Quick Communication - See Telephone Encounter >> Jul 08, 2017  1:48 PM Diana EvesHoyt, Maryann B wrote: CRM for notification. See Telephone encounter for:  07/08/17. Pt needs rx [facility] of [facility]. [phone] (phone number) [facility] told [facility] not to fill scripts.

## 2017-07-18 ENCOUNTER — Other Ambulatory Visit: Payer: Self-pay | Admitting: Family Medicine

## 2017-07-18 DIAGNOSIS — Z1231 Encounter for screening mammogram for malignant neoplasm of breast: Secondary | ICD-10-CM

## 2017-09-16 ENCOUNTER — Ambulatory Visit
Admission: RE | Admit: 2017-09-16 | Discharge: 2017-09-16 | Disposition: A | Discharge status: Home / Self Care | Payer: Medicare Other | Source: Ambulatory Visit | Attending: Family Medicine | Admitting: Family Medicine

## 2017-09-16 DIAGNOSIS — Z1231 Encounter for screening mammogram for malignant neoplasm of breast: Secondary | ICD-10-CM | POA: Diagnosis not present

## 2017-09-19 ENCOUNTER — Encounter: Payer: Self-pay | Admitting: Family Medicine

## 2017-11-18 ENCOUNTER — Telehealth: Payer: Self-pay

## 2017-11-18 NOTE — Telephone Encounter (Signed)
Copied from CRM [phone]. Topic: Medicare AWV >> Nov 18, 2017  2:57 PM [place], [place] A, LPN wrote: Reason for CRM: Called to schedule medicare annual wellness visit with [facility] Tiffany Hill,LP[facility]. Please schedule anytime after 12/29/2017 Any questions please contact Bedelia PersonKathyrn Brown on skype or by phone- [phone]  Last awv 12/28/2017

## 2017-11-29 ENCOUNTER — Other Ambulatory Visit: Payer: Self-pay | Admitting: Family Medicine

## 2017-11-29 DIAGNOSIS — E7800 Pure hypercholesterolemia, unspecified: Secondary | ICD-10-CM

## 2017-11-29 DIAGNOSIS — I129 Hypertensive chronic kidney disease with stage 1 through stage 4 chronic kidney disease, or unspecified chronic kidney disease: Secondary | ICD-10-CM

## 2017-12-30 ENCOUNTER — Ambulatory Visit: Payer: Medicare Other

## 2018-01-03 ENCOUNTER — Encounter: Payer: Medicare Other | Admitting: Family Medicine

## 2018-01-09 ENCOUNTER — Ambulatory Visit (INDEPENDENT_AMBULATORY_CARE_PROVIDER_SITE_OTHER): Payer: Medicare Other

## 2018-01-09 VITALS — BP 136/78 | HR 66 | Temp 97.8°F | Resp 16 | Ht 61.0 in | Wt 229.3 lb

## 2018-01-09 DIAGNOSIS — Z0000 Encounter for general adult medical examination without abnormal findings: Secondary | ICD-10-CM

## 2018-01-09 NOTE — Patient Instructions (Addendum)
Anne Bell , Thank you for taking time to come for your Medicare Wellness Visit. I appreciate your ongoing commitment to your health goals. Please review the following plan we discussed and let me know if I can assist you in the future.   Screening recommendations/referrals: Colonoscopy: completed 08/07/2008, will order cologuard 07/2019 Mammogram: completed 09/16/2017 Bone Density: completed 08/05/2015 Recommended yearly ophthalmology/optometry visit for glaucoma screening and checkup Recommended yearly dental visit for hygiene and checkup  Vaccinations: Influenza vaccine: up to date Pneumococcal vaccine: up to date Tdap vaccine: up to date Shingles vaccine: completed   Advanced directives: Please bring a copy of your health care power of attorney and living will to the office at your convenience.  Conditions/risks identified: Recommend drinking at least 6-8 glasses of water a day  Next appointment: Follow up on 01/24/2018 at 2:00pm with Dr.Johnson. Follow up in one year for your annual wellness exam.   Preventive Care 65 Years and Older, Female Preventive care refers to lifestyle choices and visits with your health care provider that can promote health and wellness. What does preventive care include?  A yearly physical exam. This is also called an annual well check.  Dental exams once or twice a year.  Routine eye exams. Ask your health care provider how often you should have your eyes checked.  Personal lifestyle choices, including:  Daily care of your teeth and gums.  Regular physical activity.  Eating a healthy diet.  Avoiding tobacco and drug use.  Limiting alcohol use.  Practicing safe sex.  Taking low-dose aspirin every day.  Taking vitamin and mineral supplements as recommended by your health care provider. What happens during an annual well check? The services and screenings done by your health care provider during your annual well check will depend on your  age, overall health, lifestyle risk factors, and family history of disease. Counseling  Your health care provider may ask you questions about your:  Alcohol use.  Tobacco use.  Drug use.  Emotional well-being.  Home and relationship well-being.  Sexual activity.  Eating habits.  History of falls.  Memory and ability to understand (cognition).  Work and work Astronomer.  Reproductive health. Screening  You may have the following tests or measurements:  Height, weight, and BMI.  Blood pressure.  Lipid and cholesterol levels. These may be checked every 5 years, or more frequently if you are over 21 years old.  Skin check.  Lung cancer screening. You may have this screening every year starting at age 65 if you have a 30-pack-year history of smoking and currently smoke or have quit within the past 15 years.  Fecal occult blood test (FOBT) of the stool. You may have this test every year starting at age 62.  Flexible sigmoidoscopy or colonoscopy. You may have a sigmoidoscopy every 5 years or a colonoscopy every 10 years starting at age 86.  Hepatitis C blood test.  Hepatitis B blood test.  Sexually transmitted disease (STD) testing.  Diabetes screening. This is done by checking your blood sugar (glucose) after you have not eaten for a while (fasting). You may have this done every 1-3 years.  Bone density scan. This is done to screen for osteoporosis. You may have this done starting at age 61.  Mammogram. This may be done every 1-2 years. Talk to your health care provider about how often you should have regular mammograms. Talk with your health care provider about your test results, treatment options, and if necessary, the need for  more tests. Vaccines  Your health care provider may recommend certain vaccines, such as:  Influenza vaccine. This is recommended every year.  Tetanus, diphtheria, and acellular pertussis (Tdap, Td) vaccine. You may need a Td booster  every 10 years.  Zoster vaccine. You may need this after age 46.  Pneumococcal 13-valent conjugate (PCV13) vaccine. One dose is recommended after age 2.  Pneumococcal polysaccharide (PPSV23) vaccine. One dose is recommended after age 62. Talk to your health care provider about which screenings and vaccines you need and how often you need them. This information is not intended to replace advice given to you by your health care provider. Make sure you discuss any questions you have with your health care provider. Document Released: 09/26/2015 Document Revised: 05/19/2016 Document Reviewed: 07/01/2015 Elsevier Interactive Patient Education  2017 [employer] Prevention in the Home Falls can cause injuries. They can happen to people of all ages. There are many things you can do to make your home safe and to help prevent falls. What can I do on the outside of my home?  Regularly fix the edges of walkways and driveways and fix any cracks.  Remove anything that might make you trip as you walk through a door, such as a raised step or threshold.  Trim any bushes or trees on the path to your home.  Use bright outdoor lighting.  Clear any walking paths of anything that might make someone trip, such as rocks or tools.  Regularly check to see if handrails are loose or broken. Make sure that both sides of any steps have handrails.  Any raised decks and porches should have guardrails on the edges.  Have any leaves, snow, or ice cleared regularly.  Use sand or salt on walking paths during winter.  Clean up any spills in your garage right away. This includes oil or grease spills. What can I do in the bathroom?  Use night lights.  Install grab bars by the toilet and in the tub and shower. Do not use towel bars as grab bars.  Use non-skid mats or decals in the tub or shower.  If you need to sit down in the shower, use a plastic, non-slip stool.  Keep the floor dry. Clean up any  water that spills on the floor as soon as it happens.  Remove soap buildup in the tub or shower regularly.  Attach bath mats securely with double-sided non-slip rug tape.  Do not have throw rugs and other things on the floor that can make you trip. What can I do in the bedroom?  Use night lights.  Make sure that you have a light by your bed that is easy to reach.  Do not use any sheets or blankets that are too big for your bed. They should not hang down onto the floor.  Have a firm chair that has side arms. You can use this for support while you get dressed.  Do not have throw rugs and other things on the floor that can make you trip. What can I do in the kitchen?  Clean up any spills right away.  Avoid walking on wet floors.  Keep items that you use a lot in easy-to-reach places.  If you need to reach something above you, use a strong step stool that has a grab bar.  Keep electrical cords out of the way.  Do not use floor polish or wax that makes floors slippery. If you must use wax, use non-skid  floor wax.  Do not have throw rugs and other things on the floor that can make you trip. What can I do with my stairs?  Do not leave any items on the stairs.  Make sure that there are handrails on both sides of the stairs and use them. Fix handrails that are broken or loose. Make sure that handrails are as long as the stairways.  Check any carpeting to make sure that it is firmly attached to the stairs. Fix any carpet that is loose or worn.  Avoid having throw rugs at the top or bottom of the stairs. If you do have throw rugs, attach them to the floor with carpet tape.  Make sure that you have a light switch at the top of the stairs and the bottom of the stairs. If you do not have them, ask someone to add them for you. What else can I do to help prevent falls?  Wear shoes that:  Do not have high heels.  Have rubber bottoms.  Are comfortable and fit you well.  Are closed  at the toe. Do not wear sandals.  If you use a stepladder:  Make sure that it is fully opened. Do not climb a closed stepladder.  Make sure that both sides of the stepladder are locked into place.  Ask someone to hold it for you, if possible.  Clearly mark and make sure that you can see:  Any grab bars or handrails.  First and last steps.  Where the edge of each step is.  Use tools that help you move around (mobility aids) if they are needed. These include:  Canes.  Walkers.  Scooters.  Crutches.  Turn on the lights when you go into a dark area. Replace any light bulbs as soon as they burn out.  Set up your furniture so you have a clear path. Avoid moving your furniture around.  If any of your floors are uneven, fix them.  If there are any pets around you, be aware of where they are.  Review your medicines with your doctor. Some medicines can make you feel dizzy. This can increase your chance of falling. Ask your doctor what other things that you can do to help prevent falls. This information is not intended to replace advice given to you by your health care provider. Make sure you discuss any questions you have with your health care provider. Document Released: 06/26/2009 Document Revised: 02/05/2016 Document Reviewed: 10/04/2014 Elsevier Interactive Patient Education  2017 [employer].

## 2018-01-09 NOTE — Progress Notes (Signed)
Subjective:   Anne Bell is a 67 y.o. female who presents for Medicare Annual (Subsequent) preventive examination.  Review of Systems:   Cardiac Risk Factors include: hypertension;advanced age (>14men, >46 women);dyslipidemia;obesity (BMI >30kg/m2)     Objective:     Vitals: BP 136/78 (BP Location: Left Arm, Patient Position: Sitting)   Pulse 66   Temp 97.8 F (36.6 C) (Temporal)   Resp 16   Ht  (1.549 m)   Wt 229 lb 4.8 oz (104 kg)   LMP  (LMP Unknown)   BMI 43.33 kg/m   Body mass index is 43.33 kg/m.  Advanced Directives 01/09/2018 12/28/2016 08/21/2015  Does Patient Have a Medical Advance Directive? Yes Yes No  Type of Estate agent of [place];Living will Healthcare Power of [place];Living will -  Does patient want to make changes to medical advance directive? - No - Patient declined -  Copy of Healthcare Power of Attorney in Chart? No - copy requested No - copy requested -  Would patient like information on creating a medical advance directive? - - Yes - Educational materials given    Tobacco Social History   Tobacco Use  Smoking Status Never Smoker  Smokeless Tobacco Never Used     Counseling given: Not Answered   Clinical Intake:  Pre-visit preparation completed: Yes  Pain : No/denies pain     Nutritional Status: BMI > 30  Obese Nutritional Risks: None Diabetes: No  How often do you need to have someone help you when you read instructions, pamphlets, or other written materials from your doctor or pharmacy?: 1 - Never What is the last grade level you completed in school?: 2 years college   Interpreter Needed?: No  Information entered by :: Devynne Sturdivant,LPN   Past Medical History:  Diagnosis Date  . Allergy   . Depression   . Hyperlipidemia   . Hypertension    Past Surgical History:  Procedure Laterality Date  . BREAST CYST ASPIRATION  1970's   ? side  . PLANTAR FASCIA SURGERY Left 2008   Family History    Problem Relation Age of Onset  . Alcohol abuse Father   . Mental illness Mother        Depression  . Cancer Mother   . Breast cancer Neg Hx    Social History   Socioeconomic History  . Marital status: Divorced    Spouse name: Not on file  . Number of children: Not on file  . Years of education: Not on file  . Highest education level: Not on file  Occupational History  . Occupation: Retired  Engineer, production  . Financial resource strain: Not hard at all  . Food insecurity:    Worry: Never true    Inability: Never true  . Transportation needs:    Medical: No    Non-medical: No  Tobacco Use  . Smoking status: Never Smoker  . Smokeless tobacco: Never Used  Substance and Sexual Activity  . Alcohol use: Yes    Comment: occasional  . Drug use: No  . Sexual activity: Yes    Birth control/protection: None  Lifestyle  . Physical activity:    Days per week: 0 days    Minutes per session: 0 min  . Stress: Not at all  Relationships  . Social connections:    Talks on phone: More than three times a week    Gets together: More than three times a week    Attends religious  service: Never    Active member of club or organization: No    Attends meetings of clubs or organizations: Never    Relationship status: Widowed  Other Topics Concern  . Not on file  Social History Narrative  . Not on file    Outpatient Encounter Medications as of 01/09/2018  Medication Sig  . amLODipine (NORVASC) 5 MG tablet TAKE 1 TABLET BY MOUTH  DAILY  . atorvastatin (LIPITOR) 20 MG tablet TAKE 1 TABLET BY MOUTH  DAILY  . fluticasone (FLONASE) 50 MCG/ACT nasal spray Place 2 sprays into both nostrils daily.  Marland Kitchen ibuprofen (ADVIL,MOTRIN) 400 MG tablet Take 400 mg by mouth every 6 (six) hours as needed.  . Lutein 6 MG CAPS Take 6 mg by mouth daily.   . Magnesium 500 MG CAPS Take by mouth.  . metoprolol succinate (TOPROL-XL) 100 MG 24 hr tablet TAKE 1 TABLET BY MOUTH  DAILY WITH OR IMMEDIATELY  FOLLOWING A  MEAL  . MULTIPLE VITAMIN PO Take 1 tablet by mouth daily.   Marland Kitchen oxybutynin (DITROPAN) 5 MG tablet Take 1 tablet (5 mg total) by mouth 2 (two) times daily.  . silver sulfADIAZINE (SILVADENE) 1 % cream Apply 1 application topically daily.  . valsartan-hydrochlorothiazide (DIOVAN-HCT) 160-25 MG tablet TAKE 1 TABLET BY MOUTH  DAILY   No facility-administered encounter medications on file as of 01/09/2018.     Activities of Daily Living In your present state of health, do you have any difficulty performing the following activities: 01/09/2018  Hearing? N  Vision? Y  Difficulty concentrating or making decisions? N  Walking or climbing stairs? N  Dressing or bathing? N  Doing errands, shopping? N  Preparing Food and eating ? N  Using the Toilet? N  In the past six months, have you accidently leaked urine? Y  Comment wears pads  Do you have problems with loss of bowel control? N  Managing your Medications? N  Managing your Finances? N  Housekeeping or managing your Housekeeping? N  Some recent data might be hidden    Patient Care Team: Dorcas Carrow, DO as PCP - General (Family Medicine)    Assessment:   This is a routine wellness examination for [name].  Exercise Activities and Dietary recommendations Current Exercise Habits: The patient does not participate in regular exercise at present, Exercise limited by: None identified  Goals    . DIET - INCREASE WATER INTAKE     Recommend drinking at least 6-8 glasses of water a day        Fall Risk Fall Risk  01/09/2018 07/05/2017 12/28/2016 05/30/2015  Falls in the past year? Yes Yes No No  Comment chasing a dog  - - -  Number falls in past yr: 1 1 - -  Injury with Fall? Yes Yes - -  Follow up Falls prevention discussed - - -   Is the patient's home free of loose throw rugs in walkways, pet beds, electrical cords, etc?   yes      Grab bars in the bathroom? yes      Handrails on the stairs?   yes      Adequate lighting?    yes  Timed Get Up and Go performed: Completed in 8 seconds with no use of assistive devices, steady gait. No intervention needed at this time.   Depression Screen PHQ 2/9 Scores 01/09/2018 07/05/2017 12/28/2016 08/21/2015  PHQ - 2 Score 0 0 0 0  PHQ- 9 Score - 0 - 2  Cognitive Function     6CIT Screen 01/09/2018 12/28/2016  What Year? 0 points 0 points  What month? 0 points 0 points  What time? 0 points 0 points  Count back from 20 0 points 0 points  Months in reverse 0 points 2 points  Repeat phrase 0 points 4 points  Total Score 0 6    Immunization History  Administered Date(s) Administered  . Influenza Whole 06/13/2017  . Influenza-Unspecified 05/31/2015, 06/13/2017  . Pneumococcal Conjugate-13 05/31/2015  . Pneumococcal Polysaccharide-23 12/28/2016  . Tdap 02/24/2016  . Zoster 05/28/2014    Qualifies for Shingles Vaccine? Yes, completed   Screening Tests Health Maintenance  Topic Date Due  . INFLUENZA VACCINE  04/13/2018  . COLONOSCOPY  08/07/2018  . MAMMOGRAM  09/17/2019  . TETANUS/TDAP  02/23/2026  . DEXA SCAN  Completed  . Hepatitis C Screening  Completed  . PNA vac Low Risk Adult  Completed    Cancer Screenings: Lung: Low Dose CT Chest recommended if Age 68-80 years, 30 pack-year currently smoking OR have quit w/in 15years. Patient does not qualify. Breast:  Up to date on Mammogram? Yes  09/16/2017 Up to date of Bone Density/Dexa? Yes 08/05/2015 Colorectal: completed 08/07/2008  Additional Screenings:  Hepatitis C Screening: completed 08/21/2015     Plan:    I have personally reviewed and addressed the Medicare Annual Wellness questionnaire and have noted the following in the patient's chart:  A. Medical and social history B. Use of alcohol, tobacco or illicit drugs  C. Current medications and supplements D. Functional ability and status E.  Nutritional status F.  Physical activity G. Advance directives H. List of other physicians I.   Hospitalizations, surgeries, and ER visits in previous 12 months J.  Vitals K. Screenings such as hearing and vision if needed, cognitive and depression L. Referrals and appointments   In addition, I have reviewed and discussed with patient certain preventive protocols, quality metrics, and best practice recommendations. A written personalized care plan for preventive services as well as general preventive health recommendations were provided to patient.   Signed,  Marin Roberts, LPN Nurse Health Advisor   Nurse Notes:none

## 2018-01-24 ENCOUNTER — Ambulatory Visit (INDEPENDENT_AMBULATORY_CARE_PROVIDER_SITE_OTHER): Payer: Medicare Other | Admitting: Family Medicine

## 2018-01-24 ENCOUNTER — Encounter: Payer: Self-pay | Admitting: Family Medicine

## 2018-01-24 VITALS — BP 146/81 | HR 54 | Temp 98.3°F | Ht 61.3 in | Wt 230.1 lb

## 2018-01-24 DIAGNOSIS — F419 Anxiety disorder, unspecified: Secondary | ICD-10-CM | POA: Diagnosis not present

## 2018-01-24 DIAGNOSIS — E559 Vitamin D deficiency, unspecified: Secondary | ICD-10-CM | POA: Diagnosis not present

## 2018-01-24 DIAGNOSIS — F325 Major depressive disorder, single episode, in full remission: Secondary | ICD-10-CM

## 2018-01-24 DIAGNOSIS — E7800 Pure hypercholesterolemia, unspecified: Secondary | ICD-10-CM

## 2018-01-24 DIAGNOSIS — J301 Allergic rhinitis due to pollen: Secondary | ICD-10-CM | POA: Diagnosis not present

## 2018-01-24 DIAGNOSIS — I129 Hypertensive chronic kidney disease with stage 1 through stage 4 chronic kidney disease, or unspecified chronic kidney disease: Secondary | ICD-10-CM

## 2018-01-24 DIAGNOSIS — G4733 Obstructive sleep apnea (adult) (pediatric): Secondary | ICD-10-CM

## 2018-01-24 DIAGNOSIS — M8580 Other specified disorders of bone density and structure, unspecified site: Secondary | ICD-10-CM

## 2018-01-24 DIAGNOSIS — Z0000 Encounter for general adult medical examination without abnormal findings: Secondary | ICD-10-CM

## 2018-01-24 DIAGNOSIS — N390 Urinary tract infection, site not specified: Secondary | ICD-10-CM | POA: Diagnosis not present

## 2018-01-24 DIAGNOSIS — Z0001 Encounter for general adult medical examination with abnormal findings: Secondary | ICD-10-CM | POA: Diagnosis not present

## 2018-01-24 LAB — MICROALBUMIN, URINE WAIVED
Creatinine, Urine Waived: 50 mg/dL (ref 10–300)
Microalb, Ur Waived: 10 mg/L (ref 0–19)

## 2018-01-24 MED ORDER — OXYBUTYNIN CHLORIDE 5 MG PO TABS: 5.0000 mg | ORAL_TABLET | Freq: Two times a day (BID) | ORAL | 1 refills | Status: DC

## 2018-01-24 MED ORDER — METOPROLOL SUCCINATE ER 100 MG PO TB24: ORAL_TABLET | ORAL | 1 refills | Status: DC

## 2018-01-24 MED ORDER — ATORVASTATIN CALCIUM 20 MG PO TABS: 20.0000 mg | ORAL_TABLET | Freq: Every day | ORAL | 1 refills | Status: DC

## 2018-01-24 MED ORDER — VALSARTAN-HYDROCHLOROTHIAZIDE 160-25 MG PO TABS: 1.0000 | ORAL_TABLET | Freq: Every day | ORAL | 1 refills | Status: DC

## 2018-01-24 MED ORDER — FLUTICASONE PROPIONATE 50 MCG/ACT NA SUSP: 2.0000 | Freq: Every day | NASAL | 12 refills | Status: DC

## 2018-01-24 MED ORDER — AMLODIPINE BESYLATE 5 MG PO TABS: 5.0000 mg | ORAL_TABLET | Freq: Every day | ORAL | 1 refills | Status: DC

## 2018-01-24 NOTE — Assessment & Plan Note (Signed)
Under good control. Continue current regimen. Continue to monitor. Call with any concerns. Refills given.  

## 2018-01-24 NOTE — Patient Instructions (Signed)
Health Maintenance for Postmenopausal Women Menopause is a normal process in which your reproductive ability comes to an end. This process happens gradually over a span of months to years, usually between the ages of 22 and 9. Menopause is complete when you have missed 12 consecutive menstrual periods. It is important to talk with your health care provider about some of the most common conditions that affect postmenopausal women, such as heart disease, cancer, and bone loss (osteoporosis). Adopting a healthy lifestyle and getting preventive care can help to promote your health and wellness. Those actions can also lower your chances of developing some of these common conditions. What should I know about menopause? During menopause, you may experience a number of symptoms, such as:  Moderate-to-severe hot flashes.  Night sweats.  Decrease in sex drive.  Mood swings.  Headaches.  Tiredness.  Irritability.  Memory problems.  Insomnia.  Choosing to treat or not to treat menopausal changes is an individual decision that you make with your health care provider. What should I know about hormone replacement therapy and supplements? Hormone therapy products are effective for treating symptoms that are associated with menopause, such as hot flashes and night sweats. Hormone replacement carries certain risks, especially as you become older. If you are thinking about using estrogen or estrogen with progestin treatments, discuss the benefits and risks with your health care provider. What should I know about heart disease and stroke? Heart disease, heart attack, and stroke become more likely as you age. This may be due, in part, to the hormonal changes that your body experiences during menopause. These can affect how your body processes dietary fats, triglycerides, and cholesterol. Heart attack and stroke are both medical emergencies. There are many things that you can do to help prevent heart disease  and stroke:  Have your blood pressure checked at least every 1-2 years. High blood pressure causes heart disease and increases the risk of stroke.  If you are 53-22 years old, ask your health care provider if you should take aspirin to prevent a heart attack or a stroke.  Do not use any tobacco products, including cigarettes, chewing tobacco, or electronic cigarettes. If you need help quitting, ask your health care provider.  It is important to eat a healthy diet and maintain a healthy weight. ? Be sure to include plenty of vegetables, fruits, low-fat dairy products, and lean protein. ? Avoid eating foods that are high in solid fats, added sugars, or salt (sodium).  Get regular exercise. This is one of the most important things that you can do for your health. ? Try to exercise for at least 150 minutes each week. The type of exercise that you do should increase your heart rate and make you sweat. This is known as moderate-intensity exercise. ? Try to do strengthening exercises at least twice each week. Do these in addition to the moderate-intensity exercise.  Know your numbers.Ask your health care provider to check your cholesterol and your blood glucose. Continue to have your blood tested as directed by your health care provider.  What should I know about cancer screening? There are several types of cancer. Take the following steps to reduce your risk and to catch any cancer development as early as possible. Breast Cancer  Practice breast self-awareness. ? This means understanding how your breasts normally appear and feel. ? It also means doing regular breast self-exams. Let your health care provider know about any changes, no matter how small.  If you are 40  or older, have a clinician do a breast exam (clinical breast exam or CBE) every year. Depending on your age, family history, and medical history, it may be recommended that you also have a yearly breast X-ray (mammogram).  If you  have a family history of breast cancer, talk with your health care provider about genetic screening.  If you are at high risk for breast cancer, talk with your health care provider about having an MRI and a mammogram every year.  Breast cancer (BRCA) gene test is recommended for women who have family members with BRCA-related cancers. Results of the assessment will determine the need for genetic counseling and BRCA1 and for BRCA2 testing. BRCA-related cancers include these types: ? Breast. This occurs in males or females. ? Ovarian. ? Tubal. This may also be called fallopian tube cancer. ? Cancer of the abdominal or pelvic lining (peritoneal cancer). ? Prostate. ? Pancreatic.  Cervical, Uterine, and Ovarian Cancer Your health care provider may recommend that you be screened regularly for cancer of the pelvic organs. These include your ovaries, uterus, and vagina. This screening involves a pelvic exam, which includes checking for microscopic changes to the surface of your cervix (Pap test).  For women ages 21-65, health care providers may recommend a pelvic exam and a Pap test every three years. For women ages 79-65, they may recommend the Pap test and pelvic exam, combined with testing for human papilloma virus (HPV), every five years. Some types of HPV increase your risk of cervical cancer. Testing for HPV may also be done on women of any age who have unclear Pap test results.  Other health care providers may not recommend any screening for nonpregnant women who are considered low risk for pelvic cancer and have no symptoms. Ask your health care provider if a screening pelvic exam is right for you.  If you have had past treatment for cervical cancer or a condition that could lead to cancer, you need Pap tests and screening for cancer for at least 20 years after your treatment. If Pap tests have been discontinued for you, your risk factors (such as having a new sexual partner) need to be  reassessed to determine if you should start having screenings again. Some women have medical problems that increase the chance of getting cervical cancer. In these cases, your health care provider may recommend that you have screening and Pap tests more often.  If you have a family history of uterine cancer or ovarian cancer, talk with your health care provider about genetic screening.  If you have vaginal bleeding after reaching menopause, tell your health care provider.  There are currently no reliable tests available to screen for ovarian cancer.  Lung Cancer Lung cancer screening is recommended for adults 69-62 years old who are at high risk for lung cancer because of a history of smoking. A yearly low-dose CT scan of the lungs is recommended if you:  Currently smoke.  Have a history of at least 30 pack-years of smoking and you currently smoke or have quit within the past 15 years. A pack-year is smoking an average of one pack of cigarettes per day for one year.  Yearly screening should:  Continue until it has been 15 years since you quit.  Stop if you develop a health problem that would prevent you from having lung cancer treatment.  Colorectal Cancer  This type of cancer can be detected and can often be prevented.  Routine colorectal cancer screening usually begins at  age 42 and continues through age 45.  If you have risk factors for colon cancer, your health care provider may recommend that you be screened at an earlier age.  If you have a family history of colorectal cancer, talk with your health care provider about genetic screening.  Your health care provider may also recommend using home test kits to check for hidden blood in your stool.  A small camera at the end of a tube can be used to examine your colon directly (sigmoidoscopy or colonoscopy). This is done to check for the earliest forms of colorectal cancer.  Direct examination of the colon should be repeated every  5-10 years until age 71. However, if early forms of precancerous polyps or small growths are found or if you have a family history or genetic risk for colorectal cancer, you may need to be screened more often.  Skin Cancer  Check your skin from head to toe regularly.  Monitor any moles. Be sure to tell your health care provider: ? About any new moles or changes in moles, especially if there is a change in a mole's shape or color. ? If you have a mole that is larger than the size of a pencil eraser.  If any of your family members has a history of skin cancer, especially at a young age, talk with your health care provider about genetic screening.  Always use sunscreen. Apply sunscreen liberally and repeatedly throughout the day.  Whenever you are outside, protect yourself by wearing long sleeves, pants, a wide-brimmed hat, and sunglasses.  What should I know about osteoporosis? Osteoporosis is a condition in which bone destruction happens more quickly than new bone creation. After menopause, you may be at an increased risk for osteoporosis. To help prevent osteoporosis or the bone fractures that can happen because of osteoporosis, the following is recommended:  If you are 46-71 years old, get at least 1,000 mg of calcium and at least 600 mg of vitamin D per day.  If you are older than age 55 but younger than age 65, get at least 1,200 mg of calcium and at least 600 mg of vitamin D per day.  If you are older than age 54, get at least 1,200 mg of calcium and at least 800 mg of vitamin D per day.  Smoking and excessive alcohol intake increase the risk of osteoporosis. Eat foods that are rich in calcium and vitamin D, and do weight-bearing exercises several times each week as directed by your health care provider. What should I know about how menopause affects my mental health? Depression may occur at any age, but it is more common as you become older. Common symptoms of depression  include:  Low or sad mood.  Changes in sleep patterns.  Changes in appetite or eating patterns.  Feeling an overall lack of motivation or enjoyment of activities that you previously enjoyed.  Frequent crying spells.  Talk with your health care provider if you think that you are experiencing depression. What should I know about immunizations? It is important that you get and maintain your immunizations. These include:  Tetanus, diphtheria, and pertussis (Tdap) booster vaccine.  Influenza every year before the flu season begins.  Pneumonia vaccine.  Shingles vaccine.  Your health care provider may also recommend other immunizations. This information is not intended to replace advice given to you by your health care provider. Make sure you discuss any questions you have with your health care provider. Document Released: 10/22/2005  Document Revised: 03/19/2016 Document Reviewed: 06/03/2015 [employer] Interactive Patient Education  2018 [employer]  

## 2018-01-24 NOTE — Assessment & Plan Note (Signed)
Under good control. Continue current regimen. Continue to monitor.  

## 2018-01-24 NOTE — Assessment & Plan Note (Signed)
Rechecking levels today. Await results. Call with any concerns.  

## 2018-01-24 NOTE — Assessment & Plan Note (Signed)
Stable. Continue to monitor. Call with any concerns.  ?

## 2018-01-24 NOTE — Assessment & Plan Note (Addendum)
Under better control on recheck. Lots of stress today. Continue current regimen. Continue to monitor. Call with any concerns. Refills given.

## 2018-01-24 NOTE — Assessment & Plan Note (Signed)
Continue weight bearing exercise, calcium and vitamin D. Call with any concerns.

## 2018-01-24 NOTE — Progress Notes (Signed)
BP (!) 146/81 (BP Location: Right Arm, Cuff Size: Large)   Pulse (!) 54   Temp 98.3 F (36.8 C)   Ht 5' 1.3" (1.557 m)   Wt 230 lb 1 oz (104.4 kg)   LMP  (LMP Unknown)   SpO2 96%   BMI 43.05 kg/m    Subjective:    Patient ID: Anne Bell, female    DOB: February 25, 1951, 67 y.o.   MRN: [id number]  HPI: Anne Bell is a 67 y.o. female presenting on 01/24/2018 for comprehensive medical examination. Current medical complaints include:  HYPERTENSION / HYPERLIPIDEMIA Satisfied with current treatment? yes Duration of hypertension: chronic BP monitoring frequency: not checking BP medication side effects: no Past BP meds: amlodipine, metoprolol, valsartan-hctz Duration of hyperlipidemia: chronic Cholesterol medication side effects: no Cholesterol supplements: none Past cholesterol medications: atorvastatin Medication compliance: excellent compliance Aspirin: no Recent stressors: no Recurrent headaches: no Visual changes: no Palpitations: no Dyspnea: no Chest pain: no Lower extremity edema: no Dizzy/lightheaded: no  DEPRESSION Mood status: controlled Satisfied with current treatment?: yes Symptom severity: mild  Duration of current treatment : chronic Side effects: no Medication compliance: excellent compliance Psychotherapy/counseling: no  Depressed mood: no Anxious mood: no Anhedonia: no Significant weight loss or gain: no Insomnia: no  Fatigue: no Feelings of worthlessness or guilt: no Impaired concentration/indecisiveness: no Suicidal ideations: no Hopelessness: no Crying spells: no Depression screen [facility] 2/9 01/24/2018 01/09/2018 07/05/2017 12/28/2016 08/21/2015  Decreased Interest 0 0 0 0 0  Down, Depressed, Hopeless 0 0 0 0 0  PHQ - 2 Score 0 0 0 0 0  Altered sleeping 1 - 0 - 2  Tired, decreased energy 0 - 0 - 0  Change in appetite 0 - 0 - 0  Feeling bad or failure about yourself  0 - 0 - 0  Trouble concentrating 0 - 0 - 0  Moving slowly or  fidgety/restless 0 - 0 - 0  Suicidal thoughts 0 - 0 - 0  PHQ-9 Score 1 - 0 - 2  Difficult doing work/chores - - Not difficult at all - Not difficult at all   Menopausal Symptoms: no  Depression Screen done today and results listed below:  Depression screen [facility] 2/9 01/24/2018 01/09/2018 07/05/2017 12/28/2016 08/21/2015  Decreased Interest 0 0 0 0 0  Down, Depressed, Hopeless 0 0 0 0 0  PHQ - 2 Score 0 0 0 0 0  Altered sleeping 1 - 0 - 2  Tired, decreased energy 0 - 0 - 0  Change in appetite 0 - 0 - 0  Feeling bad or failure about yourself  0 - 0 - 0  Trouble concentrating 0 - 0 - 0  Moving slowly or fidgety/restless 0 - 0 - 0  Suicidal thoughts 0 - 0 - 0  PHQ-9 Score 1 - 0 - 2  Difficult doing work/chores - - Not difficult at all - Not difficult at all     Past Medical History:  Past Medical History:  Diagnosis Date  . Allergy   . Depression   . Hyperlipidemia   . Hypertension     Surgical History:  Past Surgical History:  Procedure Laterality Date  . BREAST CYST ASPIRATION  1970's   ? side  . PLANTAR FASCIA SURGERY Left 2008    Medications:  Current Outpatient Medications on File Prior to Visit  Medication Sig  . ibuprofen (ADVIL,MOTRIN) 400 MG tablet Take 400 mg by mouth every 6 (six) hours as needed.  . Lutein  6 MG CAPS Take 6 mg by mouth daily.   . Magnesium 500 MG CAPS Take by mouth.  . MULTIPLE VITAMIN PO Take 1 tablet by mouth daily.   . silver sulfADIAZINE (SILVADENE) 1 % cream Apply 1 application topically daily.   No current facility-administered medications on file prior to visit.     Allergies:  Allergies  Allergen Reactions  . Celecoxib     stomach cramps  . Other   . Prednisone Other (See Comments)    Social History:  Social History   Socioeconomic History  . Marital status: Divorced    Spouse name: Not on file  . Number of children: Not on file  . Years of education: Not on file  . Highest education level: Not on file  Occupational  History  . Occupation: Retired  Engineer, production  . Financial resource strain: Not hard at all  . Food insecurity:    Worry: Never true    Inability: Never true  . Transportation needs:    Medical: No    Non-medical: No  Tobacco Use  . Smoking status: Never Smoker  . Smokeless tobacco: Never Used  Substance and Sexual Activity  . Alcohol use: Yes    Comment: occasional  . Drug use: No  . Sexual activity: Yes    Birth control/protection: None  Lifestyle  . Physical activity:    Days per week: 0 days    Minutes per session: 0 min  . Stress: Not at all  Relationships  . Social connections:    Talks on phone: More than three times a week    Gets together: More than three times a week    Attends religious service: Never    Active member of club or organization: No    Attends meetings of clubs or organizations: Never    Relationship status: Widowed  . Intimate partner violence:    Fear of current or ex partner: No    Emotionally abused: No    Physically abused: No    Forced sexual activity: No  Other Topics Concern  . Not on file  Social History Narrative  . Not on file   Social History   Tobacco Use  Smoking Status Never Smoker  Smokeless Tobacco Never Used   Social History   Substance and Sexual Activity  Alcohol Use Yes   Comment: occasional    Family History:  Family History  Problem Relation Age of Onset  . Alcohol abuse Father   . Mental illness Mother        Depression  . Cancer Mother   . Brain cancer Sister   . Breast cancer Neg Hx     Past medical history, surgical history, medications, allergies, family history and social history reviewed with patient today and changes made to appropriate areas of the chart.   Review of Systems  Constitutional: Negative.   HENT: Negative.   Eyes: Negative.   Respiratory: Negative.   Cardiovascular: Negative.   Gastrointestinal: Positive for nausea. Negative for abdominal pain, blood in stool, constipation,  diarrhea, heartburn, melena and vomiting.  Genitourinary: Negative.   Musculoskeletal: Negative.   Skin: Negative.   Neurological: Negative.   Endo/Heme/Allergies: Positive for environmental allergies. Negative for polydipsia. Bruises/bleeds easily.  Psychiatric/Behavioral: Negative for depression, hallucinations, memory loss, substance abuse and suicidal ideas. The patient is nervous/anxious and has insomnia.     All other ROS negative except what is listed above and in the HPI.      Objective:  BP (!) 146/81 (BP Location: Right Arm, Cuff Size: Large)   Pulse (!) 54   Temp 98.3 F (36.8 C)   Ht 5' 1.3" (1.557 m)   Wt 230 lb 1 oz (104.4 kg)   LMP  (LMP Unknown)   SpO2 96%   BMI 43.05 kg/m   Wt Readings from Last 3 Encounters:  01/24/18 230 lb 1 oz (104.4 kg)  01/09/18 229 lb 4.8 oz (104 kg)  07/05/17 230 lb 3.2 oz (104.4 kg)    Physical Exam  Constitutional: She is oriented to person, place, and time. She appears well-developed and well-nourished. No distress.  HENT:  Head: Normocephalic and atraumatic.  Right Ear: Hearing, tympanic membrane, external ear and ear canal normal.  Left Ear: Hearing, tympanic membrane, external ear and ear canal normal.  Nose: Nose normal.  Mouth/Throat: Uvula is midline, oropharynx is clear and moist and mucous membranes are normal. No oropharyngeal exudate.  Eyes: Pupils are equal, round, and reactive to light. Conjunctivae, EOM and lids are normal. Right eye exhibits no discharge. Left eye exhibits no discharge. No scleral icterus.  Neck: Normal range of motion. Neck supple. No JVD present. No tracheal deviation present. No thyromegaly present.  Cardiovascular: Normal rate, regular rhythm, normal heart sounds and intact distal pulses. Exam reveals no gallop and no friction rub.  No murmur heard. Pulmonary/Chest: Effort normal and breath sounds normal. No stridor. No respiratory distress. She has no wheezes. She has no rales. She exhibits no  tenderness.  Abdominal: Soft. Bowel sounds are normal. She exhibits no distension and no mass. There is no tenderness. There is no rebound and no guarding. No hernia.  Genitourinary:  Genitourinary Comments: Breast and pelvic exams deferred with shared decision making  Musculoskeletal: Normal range of motion. She exhibits no edema, tenderness or deformity.  Lymphadenopathy:    She has no cervical adenopathy.  Neurological: She is alert and oriented to person, place, and time. She displays normal reflexes. No cranial nerve deficit or sensory deficit. She exhibits normal muscle tone. Coordination normal.  Skin: Skin is warm, dry and intact. Capillary refill takes less than 2 seconds. No rash noted. She is not diaphoretic. No erythema. No pallor.  Psychiatric: She has a normal mood and affect. Her speech is normal and behavior is normal. Judgment and thought content normal. Cognition and memory are normal.  Nursing note and vitals reviewed.   Results for orders placed or performed in visit on 07/05/17  Comprehensive metabolic panel  Result Value Ref Range   Glucose 99 65 - 99 mg/dL   BUN 20 8 - 27 mg/dL   Creatinine, Ser 9.60 0.57 - 1.00 mg/dL   GFR calc non Af Amer 90 >59 mL/min/1.73   GFR calc Af Amer 104 >59 mL/min/1.73   BUN/Creatinine Ratio 29 (H) 12 - 28   Sodium 141 134 - 144 mmol/L   Potassium 4.2 3.5 - 5.2 mmol/L   Chloride 99 96 - 106 mmol/L   CO2 25 20 - 29 mmol/L   Calcium 10.6 (H) 8.7 - 10.3 mg/dL   Total Protein 7.4 6.0 - 8.5 g/dL   Albumin 4.7 3.6 - 4.8 g/dL   Globulin, Total 2.7 1.5 - 4.5 g/dL   Albumin/Globulin Ratio 1.7 1.2 - 2.2   Bilirubin Total 0.5 0.0 - 1.2 mg/dL   Alkaline Phosphatase 88 39 - 117 IU/L   AST 23 0 - 40 IU/L   ALT 26 0 - 32 IU/L  Lipid Panel w/o Chol/HDL Ratio  Result Value Ref Range   Cholesterol, Total 161 100 - 199 mg/dL   Triglycerides 409 0 - 149 mg/dL   HDL 57 >81 mg/dL   VLDL Cholesterol Cal 29 5 - 40 mg/dL   LDL Calculated 75 0 - 99  mg/dL      Assessment & Plan:   Problem List Items Addressed This Visit      Respiratory   Allergic rhinitis    Under good control. Continue current regimen. Continue to monitor.       Relevant Medications   fluticasone (FLONASE) 50 MCG/ACT nasal spray   OSA (obstructive sleep apnea)    Stable. Continue to monitor. Call with any concerns.       Relevant Orders   CBC with Differential/Platelet   Comprehensive metabolic panel   TSH   UA/M w/rflx Culture, Routine     Musculoskeletal and Integument   Osteopenia    Continue weight bearing exercise, calcium and vitamin D. Call with any concerns.      Relevant Orders   CBC with Differential/Platelet   Comprehensive metabolic panel   TSH   UA/M w/rflx Culture, Routine     Genitourinary   Benign hypertensive renal disease    Under better control on recheck. Lots of stress today. Continue current regimen. Continue to monitor. Call with any concerns. Refills given.      Relevant Medications   valsartan-hydrochlorothiazide (DIOVAN-HCT) 160-25 MG tablet   metoprolol succinate (TOPROL-XL) 100 MG 24 hr tablet   amLODipine (NORVASC) 5 MG tablet   Other Relevant Orders   CBC with Differential/Platelet   Comprehensive metabolic panel   Microalbumin, Urine Waived   TSH   UA/M w/rflx Culture, Routine     Other   Major depression in remission (HCC)    Under good control. Continue current regimen. Continue to monitor. Call with any concerns. Refills given.      Relevant Orders   CBC with Differential/Platelet   Comprehensive metabolic panel   TSH   UA/M w/rflx Culture, Routine   Hypercholesteremia    Under good control. Continue current regimen. Continue to monitor. Call with any concerns. Refills given.      Relevant Medications   valsartan-hydrochlorothiazide (DIOVAN-HCT) 160-25 MG tablet   metoprolol succinate (TOPROL-XL) 100 MG 24 hr tablet   atorvastatin (LIPITOR) 20 MG tablet   amLODipine (NORVASC) 5 MG tablet    Other Relevant Orders   CBC with Differential/Platelet   Comprehensive metabolic panel   Lipid Panel w/o Chol/HDL Ratio   TSH   UA/M w/rflx Culture, Routine   Avitaminosis D    Rechecking levels today. Await results. Call with any concerns.       Relevant Orders   CBC with Differential/Platelet   Comprehensive metabolic panel   TSH   UA/M w/rflx Culture, Routine   VITAMIN D 25 Hydroxy (Vit-D Deficiency, Fractures)    Other Visit Diagnoses    Routine general medical examination at a health care facility    -  Primary   Vaccines up to date. Screening labs checked today. Pap N/A. Mammogram UTD, Colon due in November. DEXA up to date. Call with any concerns. Continue diet and ex.   Acute anxiety       Sister just diagnosed with brain cancer. To have surgery tomorrow. handling it OK. Will call if needs anything.        Follow up plan: Return in about 6 months (around 07/27/2018) for Follow up.   LABORATORY TESTING:  - Pap smear:  up to date  IMMUNIZATIONS:   - Tdap: Tetanus vaccination status reviewed: last tetanus booster within 10 years. - Influenza: Up to date - Pneumovax: Up to date - Prevnar: Up to date - HPV: Not applicable - Zostavax vaccine: Up to date  SCREENING: -Mammogram: Up to date  - Colonoscopy: Due in November- wants to do cologuard  - Bone Density: Up to date   PATIENT COUNSELING:   Advised to take 1 mg of folate supplement per day if capable of pregnancy.   Sexuality: Discussed sexually transmitted diseases, partner selection, use of condoms, avoidance of unintended pregnancy  and contraceptive alternatives.   Advised to avoid cigarette smoking.  I discussed with the patient that most people either abstain from alcohol or drink within safe limits (<=14/week and <=4 drinks/occasion for males, <=7/weeks and <= 3 drinks/occasion for females) and that the risk for alcohol disorders and other health effects rises proportionally with the number of drinks per  week and how often a drinker exceeds daily limits.  Discussed cessation/primary prevention of drug use and availability of treatment for abuse.   Diet: Encouraged to adjust caloric intake to maintain  or achieve ideal body weight, to reduce intake of dietary saturated fat and total fat, to limit sodium intake by avoiding high sodium foods and not adding table salt, and to maintain adequate dietary potassium and calcium preferably from fresh fruits, vegetables, and low-fat dairy products.    stressed the importance of regular exercise  Injury prevention: Discussed safety belts, safety helmets, smoke detector, smoking near bedding or upholstery.   Dental health: Discussed importance of regular tooth brushing, flossing, and dental visits.    NEXT PREVENTATIVE PHYSICAL DUE IN 1 YEAR. Return in about 6 months (around 07/27/2018) for Follow up.

## 2018-01-25 LAB — LIPID PANEL W/O CHOL/HDL RATIO
Cholesterol, Total: 173 mg/dL (ref 100–199)
HDL: 55 mg/dL (ref >39)
LDL CALC: 94 mg/dL (ref 0–99)
TRIGLYCERIDES: 118 mg/dL (ref 0–149)
VLDL CHOLESTEROL CAL: 24 mg/dL (ref 5–40)

## 2018-01-25 LAB — COMPREHENSIVE METABOLIC PANEL
ALBUMIN: 4.6 g/dL (ref 3.6–4.8)
ALK PHOS: 89 IU/L (ref 39–117)
ALT: 20 IU/L (ref 0–32)
AST: 22 IU/L (ref 0–40)
Albumin/Globulin Ratio: 1.8 (ref 1.2–2.2)
BUN / CREAT RATIO: 20 (ref 12–28)
BUN: 15 mg/dL (ref 8–27)
Bilirubin Total: 0.6 mg/dL (ref 0.0–1.2)
CO2: 26 mmol/L (ref 20–29)
CREATININE: 0.74 mg/dL (ref 0.57–1.00)
Calcium: 10.7 mg/dL — ABNORMAL HIGH (ref 8.7–10.3)
Chloride: 101 mmol/L (ref 96–106)
GFR calc Af Amer: 97 mL/min/{1.73_m2} (ref >59)
GFR calc non Af Amer: 84 mL/min/{1.73_m2} (ref >59)
GLUCOSE: 89 mg/dL (ref 65–99)
Globulin, Total: 2.6 g/dL (ref 1.5–4.5)
Potassium: 4.2 mmol/L (ref 3.5–5.2)
Sodium: 142 mmol/L (ref 134–144)
Total Protein: 7.2 g/dL (ref 6.0–8.5)

## 2018-01-25 LAB — CBC WITH DIFFERENTIAL/PLATELET
BASOS ABS: 0 10*3/uL (ref 0.0–0.2)
Basos: 0 %
EOS (ABSOLUTE): 0.3 10*3/uL (ref 0.0–0.4)
EOS: 4 %
HEMATOCRIT: 41.8 % (ref 34.0–46.6)
HEMOGLOBIN: 13.8 g/dL (ref 11.1–15.9)
IMMATURE GRANS (ABS): 0 10*3/uL (ref 0.0–0.1)
Immature Granulocytes: 0 %
LYMPHS ABS: 1.9 10*3/uL (ref 0.7–3.1)
LYMPHS: 25 %
MCH: 30.5 pg (ref 26.6–33.0)
MCHC: 33 g/dL (ref 31.5–35.7)
MCV: 92 fL (ref 79–97)
MONOCYTES: 9 %
Monocytes Absolute: 0.6 10*3/uL (ref 0.1–0.9)
NEUTROS ABS: 4.5 10*3/uL (ref 1.4–7.0)
Neutrophils: 62 %
Platelets: 217 10*3/uL (ref 150–379)
RBC: 4.53 x10E6/uL (ref 3.77–5.28)
RDW: 13.8 % (ref 12.3–15.4)
WBC: 7.4 10*3/uL (ref 3.4–10.8)

## 2018-01-25 LAB — VITAMIN D 25 HYDROXY (VIT D DEFICIENCY, FRACTURES): Vit D, 25-Hydroxy: 32.7 ng/mL (ref 30.0–100.0)

## 2018-01-25 LAB — TSH: TSH: 0.998 u[IU]/mL (ref 0.450–4.500)

## 2018-01-26 ENCOUNTER — Encounter: Payer: Self-pay | Admitting: Family Medicine

## 2018-01-26 LAB — UA/M W/RFLX CULTURE, ROUTINE
BILIRUBIN UA: NEGATIVE
Glucose, UA: NEGATIVE
KETONES UA: NEGATIVE
NITRITE UA: NEGATIVE
Protein, UA: NEGATIVE
RBC UA: NEGATIVE
Specific Gravity, UA: 1.01 (ref 1.005–1.030)
UUROB: 0.2 mg/dL (ref 0.2–1.0)
pH, UA: 6 (ref 5.0–7.5)

## 2018-01-26 LAB — MICROSCOPIC EXAMINATION

## 2018-01-26 LAB — URINE CULTURE, REFLEX

## 2018-01-27 ENCOUNTER — Telehealth: Payer: Self-pay | Admitting: Family Medicine

## 2018-01-27 MED ORDER — ALPRAZOLAM 0.5 MG PO TBDP: 0.5000 mg | ORAL_TABLET | Freq: Two times a day (BID) | ORAL | 0 refills | Status: DC | PRN

## 2018-01-27 NOTE — Telephone Encounter (Signed)
Copied from CRM [id number]. Topic: Quick Communication - See Telephone Encounter >> Jan 27, 2018  9:59 AM Clack, Princella Pellegrini wrote: CRM for notification. See Telephone encounter for: 01/27/18.  Pt would like to know if Dr. Laural Benes would call her in something for her depression.  [id number] - [place], [place] - [address] AT [address] [facility] OF [address] & [address] [phone] (Phone) [phone] (Fax)

## 2018-01-27 NOTE — Telephone Encounter (Signed)
Detailed message left on her voice mail 

## 2018-01-27 NOTE — Telephone Encounter (Signed)
[facility] called and spoke to Maud, [facility] about the Xanax. He says the patient's insurance will only pay for the plain Xanax and a new prescription will need to be sent for that.

## 2018-01-27 NOTE — Telephone Encounter (Signed)
Pt called and states the pharmacy will not give the pt her medication, pt states she is needing assistance w/ getting medication and is needing someone to contact the pharmacy asap

## 2018-01-27 NOTE — Telephone Encounter (Signed)
Can you verbal order change to non-disolvable. Same amount and directions

## 2018-01-27 NOTE — Telephone Encounter (Signed)
Verbal order given  

## 2018-01-27 NOTE — Telephone Encounter (Signed)
Sister recently diagnosed with brain cancer. Has been under huge amount of stress- discussed at her physical. I've sent her over some alprazolam to help with sleep and stress. If she starts feeling worse, let me know and we'll see shat else we can do.

## 2018-02-08 ENCOUNTER — Ambulatory Visit: Payer: Medicare Other | Admitting: Podiatry

## 2018-02-08 ENCOUNTER — Encounter: Payer: Self-pay | Admitting: Podiatry

## 2018-02-08 ENCOUNTER — Ambulatory Visit (INDEPENDENT_AMBULATORY_CARE_PROVIDER_SITE_OTHER): Payer: Medicare Other

## 2018-02-08 DIAGNOSIS — M7752 Other enthesopathy of left foot: Secondary | ICD-10-CM

## 2018-02-08 DIAGNOSIS — M778 Other enthesopathies, not elsewhere classified: Secondary | ICD-10-CM

## 2018-02-08 DIAGNOSIS — G5761 Lesion of plantar nerve, right lower limb: Secondary | ICD-10-CM

## 2018-02-08 DIAGNOSIS — M779 Enthesopathy, unspecified: Secondary | ICD-10-CM

## 2018-02-08 DIAGNOSIS — M7751 Other enthesopathy of right foot: Secondary | ICD-10-CM

## 2018-02-08 DIAGNOSIS — G5781 Other specified mononeuropathies of right lower limb: Secondary | ICD-10-CM

## 2018-02-08 NOTE — Progress Notes (Addendum)
She was scanned for new set of orthotics.She presents today with a chief complaint of stinging and burning to the dorsal aspect of the right foot.  Objective: Vital signs are stable she is alert and oriented x3.  Pulses are palpable.  She has pain on palpation and a palpable Mulder's click third interspace of the right foot.    Assessment: Morton's neuroma right foot.  Plan: Injected 20 mg Kenalog 5 mg Marcaine point maximal tenderness right foot.  This is to the third interdigital space of the right foot.  Follow-up with her on as-needed basis tolerated procedure well.  She was scanned for new set of orthotics.

## 2018-03-01 ENCOUNTER — Ambulatory Visit (INDEPENDENT_AMBULATORY_CARE_PROVIDER_SITE_OTHER): Payer: Medicare Other | Admitting: Orthotics

## 2018-03-01 DIAGNOSIS — G5761 Lesion of plantar nerve, right lower limb: Secondary | ICD-10-CM

## 2018-03-01 DIAGNOSIS — M7671 Peroneal tendinitis, right leg: Secondary | ICD-10-CM

## 2018-03-01 DIAGNOSIS — G5781 Other specified mononeuropathies of right lower limb: Secondary | ICD-10-CM

## 2018-03-01 NOTE — Progress Notes (Signed)
Patient came in today to pick up custom made foot orthotics.  The goals were accomplished and the patient reported no dissatisfaction with said orthotics.  Patient was advised of breakin period and how to report any issues. 

## 2018-06-05 ENCOUNTER — Other Ambulatory Visit: Payer: Self-pay | Admitting: Family Medicine

## 2018-06-05 DIAGNOSIS — I129 Hypertensive chronic kidney disease with stage 1 through stage 4 chronic kidney disease, or unspecified chronic kidney disease: Secondary | ICD-10-CM

## 2018-06-09 ENCOUNTER — Other Ambulatory Visit: Payer: Self-pay | Admitting: Family Medicine

## 2018-06-09 DIAGNOSIS — E7800 Pure hypercholesterolemia, unspecified: Secondary | ICD-10-CM

## 2018-07-27 ENCOUNTER — Encounter: Payer: Self-pay | Admitting: Family Medicine

## 2018-07-27 ENCOUNTER — Ambulatory Visit (INDEPENDENT_AMBULATORY_CARE_PROVIDER_SITE_OTHER): Payer: Medicare Other | Admitting: Family Medicine

## 2018-07-27 VITALS — BP 130/80 | HR 57 | Temp 97.5°F | Ht 61.0 in | Wt 226.4 lb

## 2018-07-27 DIAGNOSIS — E7800 Pure hypercholesterolemia, unspecified: Secondary | ICD-10-CM

## 2018-07-27 DIAGNOSIS — F325 Major depressive disorder, single episode, in full remission: Secondary | ICD-10-CM

## 2018-07-27 DIAGNOSIS — I129 Hypertensive chronic kidney disease with stage 1 through stage 4 chronic kidney disease, or unspecified chronic kidney disease: Secondary | ICD-10-CM

## 2018-07-27 DIAGNOSIS — E559 Vitamin D deficiency, unspecified: Secondary | ICD-10-CM

## 2018-07-27 DIAGNOSIS — N3281 Overactive bladder: Secondary | ICD-10-CM

## 2018-07-27 DIAGNOSIS — Z1211 Encounter for screening for malignant neoplasm of colon: Secondary | ICD-10-CM

## 2018-07-27 NOTE — Assessment & Plan Note (Signed)
Doing well off medicine. Continue to monitor. Call with any concerns.  

## 2018-07-27 NOTE — Assessment & Plan Note (Signed)
Under good control on current regimen. Continue current regimen. Continue to monitor. Call with any concerns. Refills given. Checking labs today.  

## 2018-07-27 NOTE — Progress Notes (Signed)
BP 130/80 (BP Location: Left Arm, Patient Position: Sitting, Cuff Size: Normal)   Pulse (!) 57   Temp (!) 97.5 F (36.4 C) (Oral)   Ht 5\' 1"  (1.549 m)   Wt 226 lb 6.4 oz (102.7 kg)   LMP  (LMP Unknown)   SpO2 95%   BMI 42.78 kg/m    Subjective:    Patient ID: Anne Bell, female    DOB: [birth year], 67 y.o.   MRN: [id number]  HPI: Anne Bell is a 67 y.o. female  Chief Complaint  Patient presents with  . Follow-up    6 month F/U  . Hypertension  . Hyperlipidemia  . Depression   HYPERTENSION / HYPERLIPIDEMIA Satisfied with current treatment? yes Duration of hypertension: chronic BP monitoring frequency: not checking BP medication side effects: no Past BP meds: valsartan-hctz, metoprolol, amlodipine Duration of hyperlipidemia: chronic Cholesterol medication side effects: no Cholesterol supplements: none Past cholesterol medications: atorvastatin Medication compliance: excellent compliance Aspirin: no Recent stressors: no Recurrent headaches: no Visual changes: no Palpitations: no Dyspnea: no Chest pain: no Lower extremity edema: no Dizzy/lightheaded: no  DEPRESSION- sister is doing better with her cancer treatment. She is happy about that and doing a bit better, she is starting to feel more like herself Mood status: controlled Satisfied with current treatment?: yes Symptom severity: mild  Duration of current treatment : chronic Side effects: no Medication compliance: excellent compliance Psychotherapy/counseling: no  Previous psychiatric medications: none currently Depressed mood: no Anxious mood: no Anhedonia: no Significant weight loss or gain: no Insomnia: no  Fatigue: no Feelings of worthlessness or guilt: no Impaired concentration/indecisiveness: no Suicidal ideations: no Hopelessness: no Crying spells: no [facility] screen [facility] 07/27/2018 01/24/2018 01/09/2018 07/05/2017 12/28/2016  Decreased Interest 0 0 0 0 0  Down, Depressed, Hopeless  0 0 0 0 0  PHQ - 2 Score 0 0 0 0 0  Altered sleeping 0 1 - 0 -  Tired, decreased energy 0 0 - 0 -  Change in appetite 0 0 - 0 -  Feeling bad or failure about yourself  0 0 - 0 -  Trouble concentrating 0 0 - 0 -  Moving slowly or fidgety/restless 0 0 - 0 -  Suicidal thoughts 0 0 - 0 -  PHQ-9 Score 0 1 - 0 -  Difficult doing work/chores Not difficult at all - - Not difficult at all -   GAD 7 : Generalized Anxiety Score 07/27/2018  Nervous, Anxious, on Edge 0  Control/stop worrying 0  Worry too much - different things 0  Trouble relaxing 0  Restless 0  Easily annoyed or irritable 0  Afraid - awful might happen 0  Total GAD 7 Score 0  Anxiety Difficulty Not difficult at all    Relevant past medical, surgical, family and social history reviewed and updated as indicated. Interim medical history since our last visit reviewed. Allergies and medications reviewed and updated.  Review of Systems  Constitutional: Negative.   Respiratory: Negative.   Cardiovascular: Negative.   Skin: Negative.   Psychiatric/Behavioral: Negative for agitation, behavioral problems, confusion, decreased concentration, dysphoric mood, hallucinations, self-injury, sleep disturbance and suicidal ideas. The patient is nervous/anxious. The patient is not hyperactive.     Per HPI unless specifically indicated above     Objective:    BP 130/80 (BP Location: Left Arm, Patient Position: Sitting, Cuff Size: Normal)   Pulse (!) 57   Temp (!) 97.5 F (36.4 C) (Oral)   Ht 5\' 1"  (  1.549 m)   Wt 226 lb 6.4 oz (102.7 kg)   LMP  (LMP Unknown)   SpO2 95%   BMI 42.78 kg/m   Wt Readings from Last 3 Encounters:  07/27/18 226 lb 6.4 oz (102.7 kg)  01/24/18 230 lb 1 oz (104.4 kg)  01/09/18 229 lb 4.8 oz (104 kg)    Physical Exam  Constitutional: She is oriented to person, place, and time. She appears well-developed and well-nourished. No distress.  HENT:  Head: Normocephalic and atraumatic.  Right Ear: Hearing  normal.  Left Ear: Hearing normal.  Nose: Nose normal.  Eyes: Conjunctivae and lids are normal. Right eye exhibits no discharge. Left eye exhibits no discharge. No scleral icterus.  Cardiovascular: Normal rate, regular rhythm, normal heart sounds and intact distal pulses. Exam reveals no gallop and no friction rub.  No murmur heard. Pulmonary/Chest: Effort normal and breath sounds normal. No stridor. No respiratory distress. She has no wheezes. She has no rales. She exhibits no tenderness.  Musculoskeletal: Normal range of motion.  Neurological: She is alert and oriented to person, place, and time.  Skin: Skin is warm, dry and intact. Capillary refill takes less than 2 seconds. No rash noted. She is not diaphoretic. No erythema. No pallor.  Psychiatric: She has a normal mood and affect. Her speech is normal and behavior is normal. Judgment and thought content normal. Cognition and memory are normal.  Nursing note and vitals reviewed.   Results for orders placed or performed in visit on 01/24/18  Microscopic Examination  Result Value Ref Range   WBC, UA 0-5 0 - 5 /hpf   RBC, UA 0-2 0 - 2 /hpf   Epithelial Cells (non renal) 0-10 0 - 10 /hpf   Bacteria, UA Few None seen/Few  Urine Culture, Reflex  Result Value Ref Range   Urine Culture, Routine Final report    Organism ID, Bacteria Comment   CBC with Differential/Platelet  Result Value Ref Range   WBC 7.4 3.4 - 10.8 x10E3/uL   RBC 4.53 3.77 - 5.28 x10E6/uL   Hemoglobin 13.8 11.1 - 15.9 g/dL   Hematocrit 16.1 09.6 - 46.6 %   MCV 92 79 - 97 fL   MCH 30.5 26.6 - 33.0 pg   MCHC 33.0 31.5 - 35.7 g/dL   RDW 04.5 40.9 - 81.1 %   Platelets 217 150 - 379 x10E3/uL   Neutrophils 62 Not Estab. %   Lymphs 25 Not Estab. %   Monocytes 9 Not Estab. %   Eos 4 Not Estab. %   Basos 0 Not Estab. %   Neutrophils Absolute 4.5 1.4 - 7.0 x10E3/uL   Lymphocytes Absolute 1.9 0.7 - 3.1 x10E3/uL   Monocytes Absolute 0.6 0.1 - 0.9 x10E3/uL   EOS  (ABSOLUTE) 0.3 0.0 - 0.4 x10E3/uL   Basophils Absolute 0.0 0.0 - 0.2 x10E3/uL   Immature Granulocytes 0 Not Estab. %   Immature Grans (Abs) 0.0 0.0 - 0.1 x10E3/uL  Comprehensive metabolic panel  Result Value Ref Range   Glucose 89 65 - 99 mg/dL   BUN 15 8 - 27 mg/dL   Creatinine, Ser 9.14 0.57 - 1.00 mg/dL   GFR calc non Af Amer 84 >59 mL/min/1.73   GFR calc Af Amer 97 >59 mL/min/1.73   BUN/Creatinine Ratio 20 12 - 28   Sodium 142 134 - 144 mmol/L   Potassium 4.2 3.5 - 5.2 mmol/L   Chloride 101 96 - 106 mmol/L   CO2 26 20 - 29  mmol/L   Calcium 10.7 (H) 8.7 - 10.3 mg/dL   Total Protein 7.2 6.0 - 8.5 g/dL   Albumin 4.6 3.6 - 4.8 g/dL   Globulin, Total 2.6 1.5 - 4.5 g/dL   Albumin/Globulin Ratio 1.8 1.2 - 2.2   Bilirubin Total 0.6 0.0 - 1.2 mg/dL   Alkaline Phosphatase 89 39 - 117 IU/L   AST 22 0 - 40 IU/L   ALT 20 0 - 32 IU/L  Lipid Panel w/o Chol/HDL Ratio  Result Value Ref Range   Cholesterol, Total 173 100 - 199 mg/dL   Triglycerides 161 0 - 149 mg/dL   HDL 55 >09 mg/dL   VLDL Cholesterol Cal 24 5 - 40 mg/dL   LDL Calculated 94 0 - 99 mg/dL  Microalbumin, Urine Waived  Result Value Ref Range   Microalb, Ur Waived 10 0 - 19 mg/L   Creatinine, Urine Waived 50 10 - 300 mg/dL   Microalb/Creat Ratio 30-300 (H) <30 mg/g  TSH  Result Value Ref Range   TSH 0.998 0.450 - 4.500 uIU/mL  UA/M w/rflx Culture, Routine  Result Value Ref Range   Specific Gravity, UA 1.010 1.005 - 1.030   pH, UA 6.0 5.0 - 7.5   Color, UA Straw Yellow   Appearance Ur Hazy (A) Clear   Leukocytes, UA 2+ (A) Negative   Protein, UA Negative Negative/Trace   Glucose, UA Negative Negative   Ketones, UA Negative Negative   RBC, UA Negative Negative   Bilirubin, UA Negative Negative   Urobilinogen, Ur 0.2 0.2 - 1.0 mg/dL   Nitrite, UA Negative Negative   Microscopic Examination See below:    Urinalysis Reflex Comment   VITAMIN D 25 Hydroxy (Vit-D Deficiency, Fractures)  Result Value Ref Range   Vit  D, 25-Hydroxy 32.7 30.0 - 100.0 ng/mL      Assessment & Plan:   Problem List Items Addressed This Visit      Genitourinary   Benign hypertensive renal disease - Primary    Under good control on current regimen. Continue current regimen. Continue to monitor. Call with any concerns. Refills given. Checking labs today.       Relevant Orders   CBC with Differential/Platelet   Comprehensive metabolic panel   TSH   Overactive bladder    Under good control on current regimen. Continue current regimen. Continue to monitor. Call with any concerns. Refills given. Checking labs today.         Other   Major depression in remission [facility])    Doing well off medicine. Continue to monitor. Call with any concerns.       Relevant Orders   CBC with Differential/Platelet   Comprehensive metabolic panel   TSH   Hypercholesteremia    Under good control on current regimen. Continue current regimen. Continue to monitor. Call with any concerns. Refills given. Checking labs today.       Relevant Orders   CBC with Differential/Platelet   Comprehensive metabolic panel   Lipid Panel w/o Chol/HDL Ratio   TSH   Avitaminosis D    Under good control on current regimen. Continue current regimen. Continue to monitor. Call with any concerns. Refills given. Checking labs today.       Relevant Orders   CBC with Differential/Platelet   Comprehensive metabolic panel   TSH   VITAMIN D 25 Hydroxy (Vit-D Deficiency, Fractures)    Other Visit Diagnoses    Screening for colon cancer       Will do  cologuard   Relevant Orders   Cologuard       Follow up plan: Return in about 6 months (around 01/25/2019) for Physical/Wellness.

## 2018-07-28 ENCOUNTER — Encounter: Payer: Self-pay | Admitting: Family Medicine

## 2018-07-28 LAB — COMPREHENSIVE METABOLIC PANEL
ALK PHOS: 89 IU/L (ref 39–117)
ALT: 21 IU/L (ref 0–32)
AST: 21 IU/L (ref 0–40)
Albumin/Globulin Ratio: 1.8 (ref 1.2–2.2)
Albumin: 4.6 g/dL (ref 3.6–4.8)
BILIRUBIN TOTAL: 0.6 mg/dL (ref 0.0–1.2)
BUN/Creatinine Ratio: 28 (ref 12–28)
BUN: 22 mg/dL (ref 8–27)
CALCIUM: 10.9 mg/dL — AB (ref 8.7–10.3)
CHLORIDE: 102 mmol/L (ref 96–106)
CO2: 25 mmol/L (ref 20–29)
Creatinine, Ser: 0.78 mg/dL (ref 0.57–1.00)
GFR calc Af Amer: 90 mL/min/{1.73_m2} (ref >59)
GFR calc non Af Amer: 78 mL/min/{1.73_m2} (ref >59)
GLOBULIN, TOTAL: 2.5 g/dL (ref 1.5–4.5)
Glucose: 98 mg/dL (ref 65–99)
Potassium: 4.3 mmol/L (ref 3.5–5.2)
SODIUM: 142 mmol/L (ref 134–144)
Total Protein: 7.1 g/dL (ref 6.0–8.5)

## 2018-07-28 LAB — CBC WITH DIFFERENTIAL/PLATELET
BASOS: 1 %
Basophils Absolute: 0 10*3/uL (ref 0.0–0.2)
EOS (ABSOLUTE): 0.3 10*3/uL (ref 0.0–0.4)
Eos: 4 %
Hematocrit: 36.9 % (ref 34.0–46.6)
Hemoglobin: 12.5 g/dL (ref 11.1–15.9)
IMMATURE GRANULOCYTES: 0 %
Immature Grans (Abs): 0 10*3/uL (ref 0.0–0.1)
LYMPHS ABS: 1.3 10*3/uL (ref 0.7–3.1)
Lymphs: 17 %
MCH: 31.1 pg (ref 26.6–33.0)
MCHC: 33.9 g/dL (ref 31.5–35.7)
MCV: 92 fL (ref 79–97)
MONOS ABS: 0.6 10*3/uL (ref 0.1–0.9)
Monocytes: 7 %
NEUTROS PCT: 71 %
Neutrophils Absolute: 5.7 10*3/uL (ref 1.4–7.0)
PLATELETS: 182 10*3/uL (ref 150–450)
RBC: 4.02 x10E6/uL (ref 3.77–5.28)
RDW: 12.4 % (ref 12.3–15.4)
WBC: 7.9 10*3/uL (ref 3.4–10.8)

## 2018-07-28 LAB — LIPID PANEL W/O CHOL/HDL RATIO
Cholesterol, Total: 157 mg/dL (ref 100–199)
HDL: 50 mg/dL (ref >39)
LDL Calculated: 79 mg/dL (ref 0–99)
TRIGLYCERIDES: 139 mg/dL (ref 0–149)
VLDL Cholesterol Cal: 28 mg/dL (ref 5–40)

## 2018-07-28 LAB — VITAMIN D 25 HYDROXY (VIT D DEFICIENCY, FRACTURES): Vit D, 25-Hydroxy: 37.1 ng/mL (ref 30.0–100.0)

## 2018-07-28 LAB — TSH: TSH: 1.09 u[IU]/mL (ref 0.450–4.500)

## 2018-08-15 DIAGNOSIS — Z1211 Encounter for screening for malignant neoplasm of colon: Secondary | ICD-10-CM | POA: Diagnosis not present

## 2018-08-15 DIAGNOSIS — Z1212 Encounter for screening for malignant neoplasm of rectum: Secondary | ICD-10-CM | POA: Diagnosis not present

## 2018-08-16 LAB — COLOGUARD: Cologuard: NEGATIVE

## 2018-10-11 ENCOUNTER — Ambulatory Visit: Payer: Medicare Other | Admitting: Orthotics

## 2018-10-11 DIAGNOSIS — M7671 Peroneal tendinitis, right leg: Secondary | ICD-10-CM | POA: Diagnosis not present

## 2018-10-11 DIAGNOSIS — M7672 Peroneal tendinitis, left leg: Secondary | ICD-10-CM | POA: Diagnosis not present

## 2018-10-11 DIAGNOSIS — G5761 Lesion of plantar nerve, right lower limb: Secondary | ICD-10-CM

## 2018-10-11 DIAGNOSIS — G5781 Other specified mononeuropathies of right lower limb: Secondary | ICD-10-CM

## 2018-10-11 NOTE — Progress Notes (Signed)
Going to remove met pad b/l as she it is not tolerable.

## 2018-10-13 ENCOUNTER — Ambulatory Visit: Payer: Self-pay

## 2018-10-13 NOTE — Telephone Encounter (Signed)
c/o severe diarrhea since early AM 1/30; has had about 25 stools since the onset, yesterday.  Vomited x 3 yesterday, but no vomiting today; is drinking water; about 1 liter today so far. Since diarrhea is so severe, she doesn't think she can leave her house.  Denied dizziness or weakness. Denied fever/ chills; denied abdominal pain.  Stated has a lot of "abdominal gurgling."  Able to drink water and Ginger Ale; drank about 1.5 liters of fluids yesterday, and 1 liter today.  No blood noted in toilet bowl, but has BRB on toilet tissue.  Reported her hemorrhoids are flared up.  Advised she should be evaluated before any medication can be prescribed.  No avail. Appts. With PCP office.  Recommended pt. Go to UC or the ER for evaluation.  Care advice given; verb. Understanding; agreed with plan.     Reason for Disposition . [1] SEVERE diarrhea (e.g., 7 or more times / day more than normal) AND [2] age > 60 years  Answer Assessment - Initial Assessment Questions 1. DIARRHEA SEVERITY: "How bad is the diarrhea?" "How many extra stools have you had in the past 24 hours than normal?"    - NO DIARRHEA (SCALE 0)   - MILD (SCALE 1-3): Few loose or mushy BMs; increase of 1-3 stools over normal daily number of stools; mild increase in ostomy output.   -  MODERATE (SCALE 4-7): Increase of 4-6 stools daily over normal; moderate increase in ostomy output. * SEVERE (SCALE 8-10; OR 'WORST POSSIBLE'): Increase of 7 or more stools daily over normal; moderate increase in ostomy output; incontinence.     Severe; 12-15 stools/ day 2. ONSET: "When did the diarrhea begin?"      Thursday, 1/30;  3. BM CONSISTENCY: "How loose or watery is the diarrhea?"      Watery with flecks of food particles  4. VOMITING: "Are you also vomiting?" If so, ask: "How many times in the past 24 hours?"      Vomited x 3 on 1/30 5. ABDOMINAL PAIN: "Are you having any abdominal pain?" If yes: "What does it feel like?" (e.g., crampy, dull,  intermittent, constant)      Abdominal "gurlgling"  6. ABDOMINAL PAIN SEVERITY: If present, ask: "How bad is the pain?"  (e.g., Scale 1-10; mild, moderate, or severe)   - MILD (1-3): doesn't interfere with normal activities, abdomen soft and not tender to touch    - MODERATE (4-7): interferes with normal activities or awakens from sleep, tender to touch    - SEVERE (8-10): excruciating pain, doubled over, unable to do any normal activities       No pain 7. ORAL INTAKE: If vomiting, "Have you been able to drink liquids?" "How much fluids have you had in the past 24 hours?"     Bottled water; drank 1.5 liters of water yesterday; 1 liter water today  8. HYDRATION: "Any signs of dehydration?" (e.g., dry mouth [not just dry lips], too weak to stand, dizziness, new weight loss) "When did you last urinate?"    Denied dizziness or weakness; denied dry mouth, is urinating but unsure how much output, due to the watery stools 9. EXPOSURE: "Have you traveled to a foreign country recently?" "Have you been exposed to anyone with diarrhea?" "Could you have eaten any food that was spoiled?"     Denied  10. ANTIBIOTIC USE: "Are you taking antibiotics now or have you taken antibiotics in the past 2 months?"  Denied  11. OTHER SYMPTOMS: "Do you have any other symptoms?" (e.g., fever, blood in stool)      Blood on toilet tissue; no fever/ chills ; nausea yesterday and vomited x 3  12. PREGNANCY: "Is there any chance you are pregnant?" "When was your last menstrual period?"       N/a  Protocols used: [facility]  Message from Arlyss Gandy, NT sent at 10/13/2018 1:32 PM EST   Summary: diarrhea    Pt states she has had "explosive diarrhea" since Monday and it is getting worse. She states imodium is not working. She states that when she drinks it gets worse. Pt would like to see if something can be sent in without an appt because it is so bad she cannot come in.

## 2018-10-13 NOTE — Telephone Encounter (Signed)
Agree with below. Addendum:    I believe that she has limits with moving and including, toileting, bathing, feeding, dressing and grooming. I believe the power wheelchair is needed for pt to be able to perform ADL's in her home

## 2018-10-25 ENCOUNTER — Other Ambulatory Visit: Payer: Medicare Other | Admitting: Orthotics

## 2018-11-09 ENCOUNTER — Ambulatory Visit (INDEPENDENT_AMBULATORY_CARE_PROVIDER_SITE_OTHER): Payer: Medicare Other | Admitting: Family Medicine

## 2018-11-09 ENCOUNTER — Telehealth: Payer: Self-pay

## 2018-11-09 ENCOUNTER — Encounter: Payer: Self-pay | Admitting: Family Medicine

## 2018-11-09 ENCOUNTER — Other Ambulatory Visit: Payer: Self-pay

## 2018-11-09 VITALS — BP 149/84 | HR 60 | Temp 97.9°F | Ht 61.0 in | Wt 223.0 lb

## 2018-11-09 DIAGNOSIS — N3281 Overactive bladder: Secondary | ICD-10-CM

## 2018-11-09 DIAGNOSIS — N390 Urinary tract infection, site not specified: Secondary | ICD-10-CM

## 2018-11-09 DIAGNOSIS — R300 Dysuria: Secondary | ICD-10-CM | POA: Diagnosis not present

## 2018-11-09 MED ORDER — NITROFURANTOIN MONOHYD MACRO 100 MG PO CAPS: 100.0000 mg | ORAL_CAPSULE | Freq: Two times a day (BID) | ORAL | 0 refills | Status: DC

## 2018-11-09 MED ORDER — TOLTERODINE TARTRATE ER 2 MG PO CP24: 2.0000 mg | ORAL_CAPSULE | Freq: Every day | ORAL | 2 refills | Status: DC

## 2018-11-09 NOTE — Progress Notes (Signed)
BP (!) 149/84   Pulse 60   Temp 97.9 F (36.6 C) (Oral)   Ht 5\' 1"  (1.549 m)   Wt 223 lb (101.2 kg)   LMP  (LMP Unknown)   SpO2 97%   BMI 42.14 kg/m    Subjective:    Patient ID: Anne Bell, female    DOB: 1951-01-07, 68 y.o.   MRN: [id number]  HPI: Anne Bell is a 68 y.o. female  Chief Complaint  Patient presents with  . Urinary Tract Infection    pt states she has had burning, iching, pressure on vagine area x about 3 days ago. off and on symptoms  . Back Pain    lower back cramps   3 days of dysuria, suprapubic pressure, generalized malaise, urinary frequency, back ache. Trying AZO multi symptom with mild temporary relief. Denies N/V/D, abdominal pain, hematuria, fevers.  Hx of overactive bladder. Taking oxybutynin without much relief. No side effects but just doesn't feel benefit. Has never tried anything else   Relevant past medical, surgical, family and social history reviewed and updated as indicated. Interim medical history since our last visit reviewed. Allergies and medications reviewed and updated.  Review of Systems  Per HPI unless specifically indicated above     Objective:    BP (!) 149/84   Pulse 60   Temp 97.9 F (36.6 C) (Oral)   Ht 5\' 1"  (1.549 m)   Wt 223 lb (101.2 kg)   LMP  (LMP Unknown)   SpO2 97%   BMI 42.14 kg/m   Wt Readings from Last 3 Encounters:  11/09/18 223 lb (101.2 kg)  07/27/18 226 lb 6.4 oz (102.7 kg)  01/24/18 230 lb 1 oz (104.4 kg)    Physical Exam Vitals signs and nursing note reviewed.  Constitutional:      Appearance: Normal appearance. She is not ill-appearing.  HENT:     Head: Atraumatic.  Eyes:     Extraocular Movements: Extraocular movements intact.     Conjunctiva/sclera: Conjunctivae normal.  Neck:     Musculoskeletal: Normal range of motion and neck supple.  Cardiovascular:     Rate and Rhythm: Normal rate and regular rhythm.     Heart sounds: Normal heart sounds.  Pulmonary:     Effort:  Pulmonary effort is normal.     Breath sounds: Normal breath sounds.  Abdominal:     General: Bowel sounds are normal.     Palpations: Abdomen is soft.     Tenderness: There is no abdominal tenderness. There is no right CVA tenderness, left CVA tenderness or guarding.  Musculoskeletal: Normal range of motion.  Skin:    General: Skin is warm and dry.  Neurological:     Mental Status: She is alert and oriented to person, place, and time.  Psychiatric:        Mood and Affect: Mood normal.        Thought Content: Thought content normal.        Judgment: Judgment normal.     Results for orders placed or performed in visit on 09/18/18  Cologuard  Result Value Ref Range   Cologuard Negative       Assessment & Plan:   Problem List Items Addressed This Visit      Genitourinary   Overactive bladder    Switch to detrol LA to see if more benefit from that.        Other Visit Diagnoses    Acute lower UTI    -  Primary   Tx with macrobid, probiotics, AZO prn. Await cx results. F/u if worsening or not improving   Relevant Medications   nitrofurantoin, macrocrystal-monohydrate, (MACROBID) 100 MG capsule   Other Relevant Orders   UA/M w/rflx Culture, Routine       Follow up plan: Return if symptoms worsen or fail to improve.

## 2018-11-09 NOTE — Telephone Encounter (Signed)
Prior authorization for Tolterodine was initiated via [email or web address]. Key: [id number]   Preferred medications are:  Myrbetriq Oxybutynin ER Solifenacin succinate

## 2018-11-09 NOTE — Assessment & Plan Note (Signed)
Switch to detrol LA to see if more benefit from that.

## 2018-11-10 ENCOUNTER — Other Ambulatory Visit: Payer: Self-pay | Admitting: Family Medicine

## 2018-11-10 DIAGNOSIS — Z1231 Encounter for screening mammogram for malignant neoplasm of breast: Secondary | ICD-10-CM

## 2018-11-12 LAB — MICROSCOPIC EXAMINATION: WBC, UA: >30 /hpf — AB (ref 0–5)

## 2018-11-12 LAB — UA/M W/RFLX CULTURE, ROUTINE
BILIRUBIN UA: NEGATIVE
Glucose, UA: NEGATIVE
KETONES UA: NEGATIVE
Nitrite, UA: NEGATIVE
SPEC GRAV UA: 1.025 (ref 1.005–1.030)
Urobilinogen, Ur: 0.2 mg/dL (ref 0.2–1.0)
pH, UA: 6 (ref 5.0–7.5)

## 2018-11-12 LAB — URINE CULTURE, REFLEX

## 2018-11-15 ENCOUNTER — Telehealth: Payer: Self-pay | Admitting: Family Medicine

## 2018-11-15 NOTE — Telephone Encounter (Signed)
Copied from CRM [id number]. Topic: Quick Communication - See Telephone Encounter >> Nov 15, 2018 12:10 PM Mickel Baas B, NT wrote: CRM for notification. See Telephone encounter for: 11/15/18. Patient calling and states that her insurance is not wanting to pay for her tolterodine (DETROL LA) 2 MG 24 hr capsule. Would like to know if there is a medication that could be sent in place of this medication? Please advise.  [id number] [place], [place] - [address]#: [phone]

## 2018-11-16 NOTE — Telephone Encounter (Signed)
She can check with her insurance about what they cover and I'll be happy to call in an alternative.

## 2018-11-16 NOTE — Telephone Encounter (Signed)
Spoke with patient. She stated the preferred medications are: mybetriq, oxybutynin E.R. (current medication), Solifenacin. And she must try and fail 2 before they will approve tolterodine.

## 2018-11-17 ENCOUNTER — Ambulatory Visit
Admission: RE | Admit: 2018-11-17 | Discharge: 2018-11-17 | Disposition: A | Discharge status: Home / Self Care | Payer: Medicare Other | Source: Ambulatory Visit | Attending: Family Medicine | Admitting: Family Medicine

## 2018-11-17 DIAGNOSIS — Z1231 Encounter for screening mammogram for malignant neoplasm of breast: Secondary | ICD-10-CM | POA: Diagnosis not present

## 2018-11-17 MED ORDER — OXYBUTYNIN CHLORIDE 5 MG PO TABS: 5.0000 mg | ORAL_TABLET | Freq: Two times a day (BID) | ORAL | 1 refills | Status: DC

## 2018-12-26 ENCOUNTER — Telehealth: Payer: Self-pay

## 2018-12-26 NOTE — Telephone Encounter (Signed)
Attempted to call office, no answer. Please call back to r/s appt [phone]

## 2018-12-26 NOTE — Telephone Encounter (Signed)
-----   Message from Sharol Given sent at 12/26/2018  8:49 AM EDT ----- Regarding: AWV reschedule Call pt to reschedule.

## 2018-12-26 NOTE — Telephone Encounter (Signed)
AWV rescheduled for 02/22/2019, patient confirmed. Will mail appt reminder per pt request

## 2018-12-26 NOTE — Telephone Encounter (Signed)
Called patient back and left message, ok to reschedule awv anytime after 03/14/2019

## 2019-01-03 ENCOUNTER — Other Ambulatory Visit: Payer: Self-pay | Admitting: Family Medicine

## 2019-01-03 DIAGNOSIS — I129 Hypertensive chronic kidney disease with stage 1 through stage 4 chronic kidney disease, or unspecified chronic kidney disease: Secondary | ICD-10-CM

## 2019-01-03 DIAGNOSIS — E7800 Pure hypercholesterolemia, unspecified: Secondary | ICD-10-CM

## 2019-01-11 ENCOUNTER — Ambulatory Visit: Payer: Self-pay

## 2019-01-25 ENCOUNTER — Encounter: Payer: Medicare Other | Admitting: Family Medicine

## 2019-02-05 ENCOUNTER — Other Ambulatory Visit: Payer: Self-pay | Admitting: Family Medicine

## 2019-02-06 ENCOUNTER — Other Ambulatory Visit: Payer: Self-pay

## 2019-02-06 NOTE — Patient Outreach (Signed)
[facility]) Care Management  02/06/2019  Anne Bell 1951/08/29 [id number]   Medication Adherence call to Anne Bell HIPPA Compliant Voice message left with a call back number. Anne Bell is showing past due on Atorvastatin 20 mg and Valsartan/Hctz 160/25 mg under [facility] Ins.   Lillia Abed CPhT Pharmacy Technician [facility] Direct Dial [phone]  Fax [phone] [email or web address]

## 2019-02-14 ENCOUNTER — Other Ambulatory Visit: Payer: Self-pay

## 2019-02-14 NOTE — Patient Outreach (Signed)
[facility]) Care Management  02/14/2019  Anne Bell September 08, 1951 [id number]   Medication Adherence call to [name] HIPPA Compliant Voice message left with a call back number. [name] is showing past due under [facility] Ins.   Lillia Abed CPhT Pharmacy Technician [facility] Care Management Direct Dial [phone]  Fax [phone] [name]

## 2019-02-19 ENCOUNTER — Other Ambulatory Visit: Payer: Self-pay | Admitting: Family Medicine

## 2019-02-19 DIAGNOSIS — J301 Allergic rhinitis due to pollen: Secondary | ICD-10-CM

## 2019-02-19 NOTE — Telephone Encounter (Signed)
Requested medication (s) are due for refill today: Yes  Requested medication (s) are on the active medication list: Yes  Last refill:  02/03/18  Future visit scheduled: Yes  Notes to clinic:  Unable to refill, expired Rx     Requested Prescriptions  Pending Prescriptions Disp Refills   fluticasone (FLONASE) 50 MCG/ACT nasal spray [Pharmacy Med Name: FLUTICASONE  50MCG  SPRAY  PROP.] 48 g     Sig: USE 2 SPRAYS IN EACH  NOSTRIL DAILY     Ear, Nose, and Throat: Nasal Preparations - Corticosteroids Passed - 02/19/2019  8:22 AM      Passed - Valid encounter within last 12 months    Recent Outpatient Visits          3 months ago Acute lower UTI   [facility] Volney American, PA-C   6 months ago Benign hypertensive renal disease   [place], [place], DO   1 year ago Routine general medical examination at a health care facility   [facility], [place], DO   1 year ago Benign hypertensive renal disease   [place], Megan P, DO   2 years ago Medicare annual wellness visit, subsequent   [employer], [place], DO      Future Appointments            In 3 days  [facility], [place]   In 1 week Wynetta Emery, Barb Merino, DO [facility], [facility]

## 2019-02-22 ENCOUNTER — Ambulatory Visit (INDEPENDENT_AMBULATORY_CARE_PROVIDER_SITE_OTHER): Payer: Medicare Other

## 2019-02-22 DIAGNOSIS — Z0000 Encounter for general adult medical examination without abnormal findings: Secondary | ICD-10-CM | POA: Diagnosis not present

## 2019-02-22 NOTE — Progress Notes (Signed)
Subjective:   Anne Bell is a 68 y.o. female who presents for Medicare Annual (Subsequent) preventive examination.  This visit is being conducted via phone call  - after an attmept to do on video chat - due to the COVID-19 pandemic. This patient has given me verbal consent via phone to conduct this visit, patient states they are participating from their home address. Some vital signs may be absent or patient reported.   Patient identification: identified by name, DOB, and current address.    Review of Systems:   Cardiac Risk Factors include: advanced age (>21men, >78 women);dyslipidemia;hypertension     Objective:     Vitals: LMP  (LMP Unknown)   There is no height or weight on file to calculate BMI.  Advanced Directives 02/22/2019 01/09/2018 12/28/2016 08/21/2015  Does Patient Have a Medical Advance Directive? No Yes Yes No  Type of Advance Directive - [place];Living will [place];Living will -  Does patient want to make changes to medical advance directive? - - No - Patient declined -  Copy of [place] in Chart? - No - copy requested No - copy requested -  Would patient like information on creating a medical advance directive? - - - Yes - Scientist, clinical (histocompatibility and immunogenetics) given    Tobacco Social History   Tobacco Use  Smoking Status Never Smoker  Smokeless Tobacco Never Used     Counseling given: Not Answered   Clinical Intake:  Pre-visit preparation completed: Yes  Pain : No/denies pain     Nutritional Risks: None Diabetes: No  How often do you need to have someone help you when you read instructions, pamphlets, or other written materials from your doctor or pharmacy?: 1 - Never What is the last grade level you completed in school?: some college  Interpreter Needed?: No  Information entered by :: Sharmayne Jablon,LPN  Past Medical History:  Diagnosis Date  . Allergy   . Depression   . Hyperlipidemia   .  Hypertension    Past Surgical History:  Procedure Laterality Date  . BREAST CYST ASPIRATION  1970's   ? side  . PLANTAR FASCIA SURGERY Left 2008   Family History  Problem Relation Age of Onset  . Alcohol abuse Father   . Mental illness Mother        Depression  . Cancer Mother   . Brain cancer Sister   . Breast cancer Neg Hx    Social History   Socioeconomic History  . Marital status: Divorced    Spouse name: Not on file  . Number of children: Not on file  . Years of education: Not on file  . Highest education level: Some college, no degree  Occupational History  . Occupation: Retired  Scientific laboratory technician  . Financial resource strain: Not hard at all  . Food insecurity    Worry: Never true    Inability: Never true  . Transportation needs    Medical: No    Non-medical: No  Tobacco Use  . Smoking status: Never Smoker  . Smokeless tobacco: Never Used  Substance and Sexual Activity  . Alcohol use: Yes    Comment: occasional  . Drug use: No  . Sexual activity: Yes    Birth control/protection: None  Lifestyle  . Physical activity    Days per week: 6 days    Minutes per session: 10 min  . Stress: Not at all  Relationships  . Social connections  Talks on phone: More than three times a week    Gets together: More than three times a week    Attends religious service: Never    Active member of club or organization: No    Attends meetings of clubs or organizations: Never    Relationship status: Widowed  Other Topics Concern  . Not on file  Social History Narrative  . Not on file    Outpatient Encounter Medications as of 02/22/2019  Medication Sig  . ALPRAZolam (XANAX) 0.5 MG tablet TK 1 T PO BID  . amLODipine (NORVASC) 5 MG tablet TAKE 1 TABLET BY MOUTH  DAILY  . atorvastatin (LIPITOR) 20 MG tablet TAKE 1 TABLET BY MOUTH  DAILY  . fluticasone (FLONASE) 50 MCG/ACT nasal spray USE 2 SPRAYS IN EACH  NOSTRIL DAILY  . ibuprofen (ADVIL,MOTRIN) 400 MG tablet Take 400 mg by  mouth every 6 (six) hours as needed.  . Lutein 6 MG CAPS Take 6 mg by mouth daily.   . Magnesium 500 MG CAPS Take by mouth.  . metoprolol succinate (TOPROL-XL) 100 MG 24 hr tablet TAKE 1 TABLET BY MOUTH  DAILY WITH OR IMMEDIATELY  FOLLOWING A MEAL  . MULTIPLE VITAMIN PO Take 1 tablet by mouth daily.   Marland Kitchen. oxybutynin (DITROPAN) 5 MG tablet TAKE 1 TABLET BY MOUTH TWO  TIMES DAILY  . silver sulfADIAZINE (SILVADENE) 1 % cream Apply 1 application topically daily.  . valsartan-hydrochlorothiazide (DIOVAN-HCT) 160-25 MG tablet TAKE 1 TABLET BY MOUTH  DAILY  . [DISCONTINUED] nitrofurantoin, macrocrystal-monohydrate, (MACROBID) 100 MG capsule Take 1 capsule (100 mg total) by mouth 2 (two) times daily. (Patient not taking: Reported on 02/22/2019)   No facility-administered encounter medications on file as of 02/22/2019.     Activities of Daily Living In your present state of health, do you have any difficulty performing the following activities: 02/22/2019  Hearing? N  Vision? Y  Difficulty concentrating or making decisions? N  Walking or climbing stairs? N  Dressing or bathing? N  Doing errands, shopping? N  Preparing Food and eating ? N  Using the Toilet? N  In the past six months, have you accidently leaked urine? Y  Comment wears pads for protection  Do you have problems with loss of bowel control? N  Managing your Medications? N  Managing your Finances? N  Housekeeping or managing your Housekeeping? N  Some recent data might be hidden    Patient Care Team: Dorcas CarrowJohnson, Megan P, DO as PCP - General (Family Medicine)    Assessment:   This is a routine wellness examination for Anne Bell.  Exercise Activities and Dietary recommendations Current Exercise Habits: Home exercise routine, Type of exercise: walking, Time (Minutes): 10, Frequency (Times/Week): 6, Weekly Exercise (Minutes/Week): 60, Intensity: Mild, Exercise limited by: None identified  Goals    . DIET - INCREASE WATER INTAKE      Recommend drinking at least 6-8 glasses of water a day        Fall Risk: Fall Risk  02/22/2019 01/24/2018 01/09/2018 07/05/2017 12/28/2016  Falls in the past year? 0 Yes Yes Yes No  Comment - - chasing a dog  - -  Number falls in past yr: - 1 1 1  -  Injury with Fall? - Yes Yes Yes -  Follow up - - Falls prevention discussed - -    FALL RISK PREVENTION PERTAINING TO THE HOME:  Any stairs in or around the home? Yes  If so, are there any without handrails? No  Home free of loose throw rugs in walkways, pet beds, electrical cords, etc? Yes  Adequate lighting in your home to reduce risk of falls? Yes   ASSISTIVE DEVICES UTILIZED TO PREVENT FALLS:  Life alert? No  Use of a cane, walker or w/c? No  Grab bars in the bathroom? Yes  Shower chair or bench in shower? No  Elevated toilet seat or a handicapped toilet? Yes   DME ORDERS:  DME order needed?  No   TIMED UP AND GO:  Unable to perform    Depression Screen PHQ 2/9 Scores 02/22/2019 07/27/2018 01/24/2018 01/09/2018  PHQ - 2 Score 0 0 0 0  PHQ- 9 Score - 0 1 -     Cognitive Function     6CIT Screen 02/22/2019 01/09/2018 12/28/2016  What Year? 0 points 0 points 0 points  What month? 0 points 0 points 0 points  What time? 0 points 0 points 0 points  Count back from 20 0 points 0 points 0 points  Months in reverse 0 points 0 points 2 points  Repeat phrase 0 points 0 points 4 points  Total Score 0 0 6    Immunization History  Administered Date(s) Administered  . Influenza Whole 06/13/2017  . Influenza, High Dose Seasonal PF 06/20/2018  . Influenza-Unspecified 05/31/2015, 06/13/2017  . Pneumococcal Conjugate-13 05/31/2015  . Pneumococcal Polysaccharide-23 12/28/2016  . Tdap 02/24/2016  . Zoster 05/28/2014    Qualifies for Shingles Vaccine? Yes  Zostavax completed 05/28/2014. Shingrix done at [facility] in 2018/2019  Tdap: up to date   Flu Vaccine: up to date   Pneumococcal Vaccine: up to date   Screening Tests  Health Maintenance  Topic Date Due  . INFLUENZA VACCINE  04/14/2019  . MAMMOGRAM  11/16/2020  . Fecal DNA (Cologuard)  08/15/2021  . TETANUS/TDAP  02/23/2026  . DEXA SCAN  Completed  . Hepatitis C Screening  Completed  . PNA vac Low Risk Adult  Completed    Cancer Screenings:  Colorectal Screening: Completed cologuard 08/15/2018  Mammogram: Completed 11/17/2018. Repeat every year  Bone Density: Completed 08/05/2015.  Lung Cancer Screening: (Low Dose CT Chest recommended if Age 30-80 years, 30 pack-year currently smoking OR have quit w/in 15years.) does not qualify.    Additional Screening:  Hepatitis C Screening: does qualify; Completed 08/21/2015  Vision Screening: Recommended annual ophthalmology exams for early detection of glaucoma and other disorders of the eye. Is the patient up to date with their annual eye exam?  Yes  Who is the provider or what is the name of the office in which the pt attends annual eye exams? [facility]    Dental Screening: Recommended annual dental exams for proper oral hygiene  Community Resource Referral:  CRR required this visit?  No       Plan:  I have personally reviewed and addressed the Medicare Annual Wellness questionnaire and have noted the following in the patient's chart:  A. Medical and social history B. Use of alcohol, tobacco or illicit drugs  C. Current medications and supplements D. Functional ability and status E.  Nutritional status F.  Physical activity G. Advance directives H. List of other physicians I.  Hospitalizations, surgeries, and ER visits in previous 12 months J.  Vitals K. Screenings such as hearing and vision if needed, cognitive and depression L. Referrals and appointments   In addition, I have reviewed and discussed with patient certain preventive protocols, quality metrics, and best practice recommendations. A written personalized care plan for  preventive services as well as general preventive health  recommendations were provided to patient. Nurse Health Advisor  Signed,    [place], Truddie Hiddeniffany A, [place]LPN  1/61/09606/07/2019 Nurse Health Advisor   Nurse Notes: patient states she wanted to discuss switching off  Oxybutynin and trying a different medication, inquired if patient wanted to schedule an appt with Dr.Johnson before cpe in July. She states she is okay waiting until July for her cpe for now but will call prior if any changes.

## 2019-02-27 ENCOUNTER — Encounter: Payer: Medicare Other | Admitting: Family Medicine

## 2019-03-27 ENCOUNTER — Encounter: Payer: Self-pay | Admitting: Family Medicine

## 2019-03-27 ENCOUNTER — Other Ambulatory Visit: Payer: Self-pay

## 2019-03-27 ENCOUNTER — Ambulatory Visit (INDEPENDENT_AMBULATORY_CARE_PROVIDER_SITE_OTHER): Payer: Medicare Other | Admitting: Family Medicine

## 2019-03-27 VITALS — BP 128/72 | HR 51 | Temp 98.6°F | Ht 61.0 in | Wt 221.0 lb

## 2019-03-27 DIAGNOSIS — E7800 Pure hypercholesterolemia, unspecified: Secondary | ICD-10-CM | POA: Diagnosis not present

## 2019-03-27 DIAGNOSIS — F325 Major depressive disorder, single episode, in full remission: Secondary | ICD-10-CM | POA: Diagnosis not present

## 2019-03-27 DIAGNOSIS — N3281 Overactive bladder: Secondary | ICD-10-CM

## 2019-03-27 DIAGNOSIS — G4733 Obstructive sleep apnea (adult) (pediatric): Secondary | ICD-10-CM | POA: Diagnosis not present

## 2019-03-27 DIAGNOSIS — E559 Vitamin D deficiency, unspecified: Secondary | ICD-10-CM

## 2019-03-27 DIAGNOSIS — I129 Hypertensive chronic kidney disease with stage 1 through stage 4 chronic kidney disease, or unspecified chronic kidney disease: Secondary | ICD-10-CM

## 2019-03-27 DIAGNOSIS — Z0000 Encounter for general adult medical examination without abnormal findings: Secondary | ICD-10-CM | POA: Diagnosis not present

## 2019-03-27 DIAGNOSIS — R8279 Other abnormal findings on microbiological examination of urine: Secondary | ICD-10-CM | POA: Diagnosis not present

## 2019-03-27 MED ORDER — METOPROLOL SUCCINATE ER 100 MG PO TB24: ORAL_TABLET | ORAL | 1 refills | Status: DC

## 2019-03-27 MED ORDER — ATORVASTATIN CALCIUM 20 MG PO TABS: 20.0000 mg | ORAL_TABLET | Freq: Every day | ORAL | 1 refills | Status: DC

## 2019-03-27 MED ORDER — AMLODIPINE BESYLATE 5 MG PO TABS: 5.0000 mg | ORAL_TABLET | Freq: Every day | ORAL | 1 refills | Status: DC

## 2019-03-27 MED ORDER — VALSARTAN-HYDROCHLOROTHIAZIDE 160-25 MG PO TABS: 1.0000 | ORAL_TABLET | Freq: Every day | ORAL | 1 refills | Status: DC

## 2019-03-27 MED ORDER — MIRABEGRON ER 25 MG PO TB24: 25.0000 mg | ORAL_TABLET | Freq: Every day | ORAL | 6 refills | Status: DC

## 2019-03-27 NOTE — Assessment & Plan Note (Signed)
Oxybutinin not working. Will try myrbetric. Rx sent to her pharmacy. Call with any concerns.

## 2019-03-27 NOTE — Progress Notes (Signed)
BP 128/72 (BP Location: Left Arm, Cuff Size: Large)   Pulse (!) 51   Temp 98.6 F (37 C) (Oral)   Ht 5\' 1"  (1.549 m)   Wt 221 lb (100.2 kg)   LMP  (LMP Unknown)   SpO2 97%   BMI 41.76 kg/m    Subjective:    Patient ID: Anne Bell, female    DOB: 1951-07-15, 68 y.o.   MRN: [id number]  HPI: Anne Bell is a 68 y.o. female presenting on 03/27/2019 for comprehensive medical examination. Current medical complaints include:  HYPERTENSION / HYPERLIPIDEMIA Satisfied with current treatment? yes Duration of hypertension: chronic BP monitoring frequency: not checking BP medication side effects: no Past BP meds: amlodipine, metoprolol, valsartan-HCTZ Duration of hyperlipidemia: chronic Cholesterol medication side effects: no Cholesterol supplements: none Past cholesterol medications: atorvastatin Medication compliance: excellent compliance Aspirin: no Recent stressors: yes Recurrent headaches: no Visual changes: no Palpitations: no Dyspnea: no Chest pain: no Lower extremity edema: no Dizzy/lightheaded: no  DEPRESSION Mood status: controlled Satisfied with current treatment?: yes Symptom severity: mild  Duration of current treatment : not on anything Psychotherapy/counseling: no  Depressed mood: no Anxious mood: no Anhedonia: no Significant weight loss or gain: no Insomnia: no  Fatigue: no Feelings of worthlessness or guilt: no Impaired concentration/indecisiveness: no Suicidal ideations: no Hopelessness: no Crying spells: no [facility]27/2019 02/22/2019 07/27/2018 01/24/2018 01/09/2018  Decreased Interest 0 0 0 0 0  Down, Depressed, Hopeless 0 0 0 0 0  PHQ - 2 Score 0 0 0 0 0  Altered sleeping 0 - 0 1 -  Tired, decreased energy 0 - 0 0 -  Change in appetite 0 - 0 0 -  Feeling bad or failure about yourself  0 - 0 0 -  Trouble concentrating 0 - 0 0 -  Moving slowly or fidgety/restless 0 - 0 0 -  Suicidal thoughts 0 - 0 0 -  PHQ-9 Score 0 - 0  1 -  Difficult doing work/chores Not difficult at all - Not difficult at all - -   GAD 7 : Generalized Anxiety Score 03/27/2019 07/27/2018  Nervous, Anxious, on Edge 1 0  Control/stop worrying 0 0  Worry too much - different things 0 0  Trouble relaxing 0 0  Restless 0 0  Easily annoyed or irritable 0 0  Afraid - awful might happen 0 0  Total GAD 7 Score 1 0  Anxiety Difficulty Not difficult at all Not difficult at all     Menopausal Symptoms: yes  Depression Screen done today and results listed below:  [facility] 03/27/2019 02/22/2019 07/27/2018 01/24/2018 01/09/2018  Decreased Interest 0 0 0 0 0  Down, Depressed, Hopeless 0 0 0 0 0  PHQ - 2 Score 0 0 0 0 0  Altered sleeping 0 - 0 1 -  Tired, decreased energy 0 - 0 0 -  Change in appetite 0 - 0 0 -  Feeling bad or failure about yourself  0 - 0 0 -  Trouble concentrating 0 - 0 0 -  Moving slowly or fidgety/restless 0 - 0 0 -  Suicidal thoughts 0 - 0 0 -  PHQ-9 Score 0 - 0 1 -  Difficult doing work/chores Not difficult at all - Not difficult at all - -    Past Medical History:  Past Medical History:  Diagnosis Date  . Allergy   . Depression   . Hyperlipidemia   . Hypertension  Surgical History:  Past Surgical History:  Procedure Laterality Date  . BREAST CYST ASPIRATION  1970's   ? side  . PLANTAR FASCIA SURGERY Left 2008    Medications:  Current Outpatient Medications on File Prior to Visit  Medication Sig  . fluticasone (FLONASE) 50 MCG/ACT nasal spray USE 2 SPRAYS IN EACH  NOSTRIL DAILY  . ibuprofen (ADVIL,MOTRIN) 400 MG tablet Take 400 mg by mouth every 6 (six) hours as needed.  . Lutein 6 MG CAPS Take 6 mg by mouth daily.   . Magnesium 500 MG CAPS Take by mouth.  . MULTIPLE VITAMIN PO Take 1 tablet by mouth daily.   . silver sulfADIAZINE (SILVADENE) 1 % cream Apply 1 application topically daily.  Marland Kitchen ALPRAZolam (XANAX) 0.5 MG tablet TK 1 T PO BID   No current facility-administered  medications on file prior to visit.     Allergies:  Allergies  Allergen Reactions  . Celecoxib     stomach cramps  . Other   . Prednisone Other (See Comments)  . Sulfa Antibiotics     Unknown allergy    Social History:  Social History   Socioeconomic History  . Marital status: Divorced    Spouse name: Not on file  . Number of children: Not on file  . Years of education: Not on file  . Highest education level: Some college, no degree  Occupational History  . Occupation: Retired  Scientific laboratory technician  . Financial resource strain: Not hard at all  . Food insecurity    Worry: Never true    Inability: Never true  . Transportation needs    Medical: No    Non-medical: No  Tobacco Use  . Smoking status: Never Smoker  . Smokeless tobacco: Never Used  Substance and Sexual Activity  . Alcohol use: Yes    Comment: occasional  . Drug use: No  . Sexual activity: Yes    Birth control/protection: None  Lifestyle  . Physical activity    Days per week: 6 days    Minutes per session: 10 min  . Stress: Not at all  Relationships  . Social connections    Talks on phone: More than three times a week    Gets together: More than three times a week    Attends religious service: Never    Active member of club or organization: No    Attends meetings of clubs or organizations: Never    Relationship status: Widowed  . Intimate partner violence    Fear of current or ex partner: No    Emotionally abused: No    Physically abused: No    Forced sexual activity: No  Other Topics Concern  . Not on file  Social History Narrative  . Not on file   Social History   Tobacco Use  Smoking Status Never Smoker  Smokeless Tobacco Never Used   Social History   Substance and Sexual Activity  Alcohol Use Yes   Comment: occasional    Family History:  Family History  Problem Relation Age of Onset  . Alcohol abuse Father   . Mental illness Mother        Depression  . Cancer Mother   . Brain  cancer Sister   . Breast cancer Neg Hx     Past medical history, surgical history, medications, allergies, family history and social history reviewed with patient today and changes made to appropriate areas of the chart.   Review of Systems  Constitutional: Negative.  HENT: Negative.   Eyes: Negative.   Respiratory: Negative.   Cardiovascular: Negative.   Gastrointestinal: Negative.   Genitourinary: Positive for frequency. Negative for dysuria, flank pain, hematuria and urgency.  Musculoskeletal: Negative.   Skin: Negative.   Neurological: Negative.   Endo/Heme/Allergies: Negative for environmental allergies and polydipsia. Bruises/bleeds easily.  Psychiatric/Behavioral: Negative.     All other ROS negative except what is listed above and in the HPI.      Objective:    BP 128/72 (BP Location: Left Arm, Cuff Size: Large)   Pulse (!) 51   Temp 98.6 F (37 C) (Oral)   Ht  (1.549 m)   Wt 221 lb (100.2 kg)   LMP  (LMP Unknown)   SpO2 97%   BMI 41.76 kg/m   Wt Readings from Last 3 Encounters:  03/27/19 221 lb (100.2 kg)  11/09/18 223 lb (101.2 kg)  07/27/18 226 lb 6.4 oz (102.7 kg)    Physical Exam Vitals signs and nursing note reviewed.  Constitutional:      General: She is not in acute distress.    Appearance: Normal appearance. She is not ill-appearing, toxic-appearing or diaphoretic.  HENT:     Head: Normocephalic and atraumatic.     Right Ear: Tympanic membrane, ear canal and external ear normal. There is no impacted cerumen.     Left Ear: Tympanic membrane, ear canal and external ear normal. There is no impacted cerumen.     Nose: Nose normal. No congestion or rhinorrhea.     Mouth/Throat:     Mouth: Mucous membranes are moist.     Pharynx: Oropharynx is clear. No oropharyngeal exudate or posterior oropharyngeal erythema.  Eyes:     General: No scleral icterus.       Right eye: No discharge.        Left eye: No discharge.     Extraocular Movements:  Extraocular movements intact.     Conjunctiva/sclera: Conjunctivae normal.     Pupils: Pupils are equal, round, and reactive to light.  Neck:     Musculoskeletal: Normal range of motion and neck supple. No neck rigidity or muscular tenderness.     Vascular: No carotid bruit.  Cardiovascular:     Rate and Rhythm: Normal rate and regular rhythm.     Pulses: Normal pulses.     Heart sounds: No murmur. No friction rub. No gallop.   Pulmonary:     Effort: Pulmonary effort is normal. No respiratory distress.     Breath sounds: Normal breath sounds. No stridor. No wheezing, rhonchi or rales.  Chest:     Chest wall: No tenderness.  Abdominal:     General: Abdomen is flat. Bowel sounds are normal. There is no distension.     Palpations: Abdomen is soft. There is no mass.     Tenderness: There is no abdominal tenderness. There is no right CVA tenderness, left CVA tenderness, guarding or rebound.     Hernia: No hernia is present.  Genitourinary:    Comments: Breast and pelvic exams deferred with shared decision making Musculoskeletal: Normal range of motion.        General: No swelling, tenderness, deformity or signs of injury.     Right lower leg: No edema.     Left lower leg: No edema.  Lymphadenopathy:     Cervical: No cervical adenopathy.  Skin:    General: Skin is warm and dry.     Capillary Refill: Capillary refill takes less than 2  seconds.     Coloration: Skin is not jaundiced or pale.     Findings: No bruising, erythema, lesion or rash.  Neurological:     General: No focal deficit present.     Mental Status: She is alert and oriented to person, place, and time. Mental status is at baseline.     Cranial Nerves: No cranial nerve deficit.     Sensory: No sensory deficit.     Motor: No weakness.     Coordination: Coordination normal.     Gait: Gait normal.     Deep Tendon Reflexes: Reflexes normal.  Psychiatric:        Mood and Affect: Mood normal.        Behavior: Behavior  normal.        Thought Content: Thought content normal.        Judgment: Judgment normal.     Results for orders placed or performed in visit on 11/09/18  Microscopic Examination   URINE  Result Value Ref Range   WBC, UA >30 (A) 0 - 5 /hpf   RBC, UA 3-10 (A) 0 - 2 /hpf   Epithelial Cells (non renal) 0-10 0 - 10 /hpf   Bacteria, UA Many (A) None seen/Few  Urine Culture, Reflex   URINE  Result Value Ref Range   Urine Culture, Routine Final report    Organism ID, Bacteria Comment   UA/M w/rflx Culture, Routine   Specimen: Urine   URINE  Result Value Ref Range   Specific Gravity, UA 1.025 1.005 - 1.030   pH, UA 6.0 5.0 - 7.5   Color, UA Yellow Yellow   Appearance Ur Cloudy (A) Clear   Leukocytes, UA 3+ (A) Negative   Protein, UA 2+ (A) Negative/Trace   Glucose, UA Negative Negative   Ketones, UA Negative Negative   RBC, UA 3+ (A) Negative   Bilirubin, UA Negative Negative   Urobilinogen, Ur 0.2 0.2 - 1.0 mg/dL   Nitrite, UA Negative Negative   Microscopic Examination See below:    Urinalysis Reflex Comment       Assessment & Plan:   Problem List Items Addressed This Visit      Respiratory   OSA (obstructive sleep apnea)    Stable. Continue to monitor. Call with any concerns. Recommend weight loss.      Relevant Orders   Comprehensive metabolic panel   TSH     Genitourinary   Benign hypertensive renal disease    Better on recheck. Continue current regimen. Continue to monitor. Call with any concerns.       Relevant Medications   amLODipine (NORVASC) 5 MG tablet   metoprolol succinate (TOPROL-XL) 100 MG 24 hr tablet   valsartan-hydrochlorothiazide (DIOVAN-HCT) 160-25 MG tablet   Other Relevant Orders   Comprehensive metabolic panel   Microalbumin, Urine Waived   TSH   UA/M w/rflx Culture, Routine   Overactive bladder    Oxybutinin not working. Will try myrbetric. Rx sent to her pharmacy. Call with any concerns.       Relevant Orders   Comprehensive  metabolic panel   TSH     Other   Major depression in remission (HCC)    Under good control on no medication. Continue to monitor. Call with any concerns.       Relevant Orders   CBC with Differential/Platelet   Comprehensive metabolic panel   TSH   Hypercholesteremia    Under good control on current regimen. Continue current regimen. Continue to  monitor. Call with any concerns. Refills given. Labs drawn today.       Relevant Medications   amLODipine (NORVASC) 5 MG tablet   atorvastatin (LIPITOR) 20 MG tablet   metoprolol succinate (TOPROL-XL) 100 MG 24 hr tablet   valsartan-hydrochlorothiazide (DIOVAN-HCT) 160-25 MG tablet   Other Relevant Orders   Comprehensive metabolic panel   Lipid Panel w/o Chol/HDL Ratio   TSH   Avitaminosis D    Rechecking levels today. Await results.       Relevant Orders   Comprehensive metabolic panel   TSH   VITAMIN D 25 Hydroxy (Vit-D Deficiency, Fractures)    Other Visit Diagnoses    Routine general medical examination at a health care facility    -  Primary   Vaccines up to date. Screening labs checked today. Cologuard, mammogram and DEXA up to date. Continue diet and exercise. Call with any concerns.        Follow up plan: Return in about 6 months (around 09/27/2019).   LABORATORY TESTING:  - Pap smear: not applicable  IMMUNIZATIONS:   - Tdap: Tetanus vaccination status reviewed: last tetanus booster within 10 years. - Influenza: Postponed to flu season - Pneumovax: Up to date - Prevnar: Up to date - HPV: Not applicable - Zostavax vaccine: Up to date  SCREENING: -Mammogram: Up to date  - Colonoscopy: Up to date  - Bone Density: Up to date   PATIENT COUNSELING:   Advised to take 1 mg of folate supplement per day if capable of pregnancy.   Sexuality: Discussed sexually transmitted diseases, partner selection, use of condoms, avoidance of unintended pregnancy  and contraceptive alternatives.   Advised to avoid cigarette  smoking.  I discussed with the patient that most people either abstain from alcohol or drink within safe limits (<=14/week and <=4 drinks/occasion for males, <=7/weeks and <= 3 drinks/occasion for females) and that the risk for alcohol disorders and other health effects rises proportionally with the number of drinks per week and how often a drinker exceeds daily limits.  Discussed cessation/primary prevention of drug use and availability of treatment for abuse.   Diet: Encouraged to adjust caloric intake to maintain  or achieve ideal body weight, to reduce intake of dietary saturated fat and total fat, to limit sodium intake by avoiding high sodium foods and not adding table salt, and to maintain adequate dietary potassium and calcium preferably from fresh fruits, vegetables, and low-fat dairy products.    stressed the importance of regular exercise  Injury prevention: Discussed safety belts, safety helmets, smoke detector, smoking near bedding or upholstery.   Dental health: Discussed importance of regular tooth brushing, flossing, and dental visits.    NEXT PREVENTATIVE PHYSICAL DUE IN 1 YEAR. Return in about 6 months (around 09/27/2019).

## 2019-03-27 NOTE — Assessment & Plan Note (Signed)
Under good control on no medication. Continue to monitor. Call with any concerns.

## 2019-03-27 NOTE — Patient Instructions (Signed)
Health Maintenance After Age 69 After age 69, you are at a higher risk for certain long-term diseases and infections as well as injuries from falls. Falls are a major cause of broken bones and head injuries in people who are older than age 69. Getting regular preventive care can help to keep you healthy and well. Preventive care includes getting regular testing and making lifestyle changes as recommended by your health care provider. Talk with your health care provider about:  Which screenings and tests you should have. A screening is a test that checks for a disease when you have no symptoms.  A diet and exercise plan that is right for you. What should I know about screenings and tests to prevent falls? Screening and testing are the best ways to find a health problem early. Early diagnosis and treatment give you the best chance of managing medical conditions that are common after age 69. Certain conditions and lifestyle choices may make you more likely to have a fall. Your health care provider may recommend:  Regular vision checks. Poor vision and conditions such as cataracts can make you more likely to have a fall. If you wear glasses, make sure to get your prescription updated if your vision changes.  Medicine review. Work with your health care provider to regularly review all of the medicines you are taking, including over-the-counter medicines. Ask your health care provider about any side effects that may make you more likely to have a fall. Tell your health care provider if any medicines that you take make you feel dizzy or sleepy.  Osteoporosis screening. Osteoporosis is a condition that causes the bones to get weaker. This can make the bones weak and cause them to break more easily.  Blood pressure screening. Blood pressure changes and medicines to control blood pressure can make you feel dizzy.  Strength and balance checks. Your health care provider may recommend certain tests to check your  strength and balance while standing, walking, or changing positions.  Foot health exam. Foot pain and numbness, as well as not wearing proper footwear, can make you more likely to have a fall.  Depression screening. You may be more likely to have a fall if you have a fear of falling, feel emotionally low, or feel unable to do activities that you used to do.  Alcohol use screening. Using too much alcohol can affect your balance and may make you more likely to have a fall. What actions can I take to lower my risk of falls? General instructions  Talk with your health care provider about your risks for falling. Tell your health care provider if: ? You fall. Be sure to tell your health care provider about all falls, even ones that seem minor. ? You feel dizzy, sleepy, or off-balance.  Take over-the-counter and prescription medicines only as told by your health care provider. These include any supplements.  Eat a healthy diet and maintain a healthy weight. A healthy diet includes low-fat dairy products, low-fat (lean) meats, and fiber from whole grains, beans, and lots of fruits and vegetables. Home safety  Remove any tripping hazards, such as rugs, cords, and clutter.  Install safety equipment such as grab bars in bathrooms and safety rails on stairs.  Keep rooms and walkways well-lit. Activity   Follow a regular exercise program to stay fit. This will help you maintain your balance. Ask your health care provider what types of exercise are appropriate for you.  If you need a cane or   walker, use it as recommended by your health care provider.  Wear supportive shoes that have nonskid soles. Lifestyle  Do not drink alcohol if your health care provider tells you not to drink.  If you drink alcohol, limit how much you have: ? 0-1 drink a day for women. ? 0-2 drinks a day for men.  Be aware of how much alcohol is in your drink. In the [place] one drink equals one typical bottle of beer (12  oz), one-half glass of wine (5 oz), or one shot of hard liquor (1 oz).  Do not use any products that contain nicotine or tobacco, such as cigarettes and e-cigarettes. If you need help quitting, ask your health care provider. Summary  Having a healthy lifestyle and getting preventive care can help to protect your health and wellness after age 69.  Screening and testing are the best way to find a health problem early and help you avoid having a fall. Early diagnosis and treatment give you the best chance for managing medical conditions that are more common for people who are older than age 69.  Falls are a major cause of broken bones and head injuries in people who are older than age 69. Take precautions to prevent a fall at home.  Work with your health care provider to learn what changes you can make to improve your health and wellness and to prevent falls. This information is not intended to replace advice given to you by your health care provider. Make sure you discuss any questions you have with your health care provider. Document Released: 07/13/2017 Document Revised: 12/21/2018 Document Reviewed: 07/13/2017 Elsevier Patient Education  2020 [employer]  

## 2019-03-27 NOTE — Assessment & Plan Note (Signed)
Rechecking levels today. Await results.  

## 2019-03-27 NOTE — Assessment & Plan Note (Signed)
Better on recheck. Continue current regimen. Continue to monitor. Call with any concerns.  

## 2019-03-27 NOTE — Assessment & Plan Note (Signed)
Stable. Continue to monitor. Call with any concerns. Recommend weight loss.

## 2019-03-27 NOTE — Assessment & Plan Note (Signed)
Under good control on current regimen. Continue current regimen. Continue to monitor. Call with any concerns. Refills given. Labs drawn today.   

## 2019-03-28 LAB — COMPREHENSIVE METABOLIC PANEL
ALT: 23 IU/L (ref 0–32)
AST: 22 IU/L (ref 0–40)
Albumin/Globulin Ratio: 2.1 (ref 1.2–2.2)
Albumin: 4.6 g/dL (ref 3.8–4.8)
Alkaline Phosphatase: 86 IU/L (ref 39–117)
BUN/Creatinine Ratio: 25 (ref 12–28)
BUN: 20 mg/dL (ref 8–27)
Bilirubin Total: 0.4 mg/dL (ref 0.0–1.2)
CO2: 28 mmol/L (ref 20–29)
Calcium: 10.9 mg/dL — ABNORMAL HIGH (ref 8.7–10.3)
Chloride: 99 mmol/L (ref 96–106)
Creatinine, Ser: 0.79 mg/dL (ref 0.57–1.00)
GFR calc Af Amer: 89 mL/min/{1.73_m2} (ref >59)
GFR calc non Af Amer: 77 mL/min/{1.73_m2} (ref >59)
Globulin, Total: 2.2 g/dL (ref 1.5–4.5)
Glucose: 100 mg/dL — ABNORMAL HIGH (ref 65–99)
Potassium: 4.5 mmol/L (ref 3.5–5.2)
Sodium: 140 mmol/L (ref 134–144)
Total Protein: 6.8 g/dL (ref 6.0–8.5)

## 2019-03-28 LAB — LIPID PANEL W/O CHOL/HDL RATIO
Cholesterol, Total: 162 mg/dL (ref 100–199)
HDL: 52 mg/dL (ref >39)
LDL Calculated: 86 mg/dL (ref 0–99)
Triglycerides: 122 mg/dL (ref 0–149)
VLDL Cholesterol Cal: 24 mg/dL (ref 5–40)

## 2019-03-28 LAB — CBC WITH DIFFERENTIAL/PLATELET
Basophils Absolute: 0 10*3/uL (ref 0.0–0.2)
Basos: 1 %
EOS (ABSOLUTE): 0.3 10*3/uL (ref 0.0–0.4)
Eos: 4 %
Hematocrit: 45 % (ref 34.0–46.6)
Hemoglobin: 14.6 g/dL (ref 11.1–15.9)
Immature Grans (Abs): 0 10*3/uL (ref 0.0–0.1)
Immature Granulocytes: 0 %
Lymphocytes Absolute: 1.4 10*3/uL (ref 0.7–3.1)
Lymphs: 20 %
MCH: 30.4 pg (ref 26.6–33.0)
MCHC: 32.4 g/dL (ref 31.5–35.7)
MCV: 94 fL (ref 79–97)
Monocytes Absolute: 0.5 10*3/uL (ref 0.1–0.9)
Monocytes: 7 %
Neutrophils Absolute: 4.7 10*3/uL (ref 1.4–7.0)
Neutrophils: 68 %
Platelets: 206 10*3/uL (ref 150–450)
RBC: 4.8 x10E6/uL (ref 3.77–5.28)
RDW: 12.7 % (ref 11.7–15.4)
WBC: 7 10*3/uL (ref 3.4–10.8)

## 2019-03-28 LAB — VITAMIN D 25 HYDROXY (VIT D DEFICIENCY, FRACTURES): Vit D, 25-Hydroxy: 31.6 ng/mL (ref 30.0–100.0)

## 2019-03-28 LAB — TSH: TSH: 1.1 u[IU]/mL (ref 0.450–4.500)

## 2019-03-29 LAB — MICROALBUMIN, URINE WAIVED
Creatinine, Urine Waived: 50 mg/dL (ref 10–300)
Microalb, Ur Waived: 10 mg/L (ref 0–19)

## 2019-03-29 LAB — UA/M W/RFLX CULTURE, ROUTINE
Bilirubin, UA: NEGATIVE
Glucose, UA: NEGATIVE
Ketones, UA: NEGATIVE
Nitrite, UA: NEGATIVE
Protein,UA: NEGATIVE
RBC, UA: NEGATIVE
Specific Gravity, UA: 1.02 (ref 1.005–1.030)
Urobilinogen, Ur: 0.2 mg/dL (ref 0.2–1.0)
pH, UA: 7 (ref 5.0–7.5)

## 2019-03-29 LAB — MICROSCOPIC EXAMINATION: RBC, Urine: NONE SEEN /hpf (ref 0–2)

## 2019-03-29 LAB — URINE CULTURE, REFLEX

## 2019-03-30 ENCOUNTER — Encounter: Payer: Self-pay | Admitting: Family Medicine

## 2019-04-05 ENCOUNTER — Telehealth: Payer: Self-pay

## 2019-04-05 DIAGNOSIS — I129 Hypertensive chronic kidney disease with stage 1 through stage 4 chronic kidney disease, or unspecified chronic kidney disease: Secondary | ICD-10-CM

## 2019-04-05 NOTE — Telephone Encounter (Signed)
[facility] requesting 90 day supply for metoprolol

## 2019-04-06 MED ORDER — METOPROLOL SUCCINATE ER 100 MG PO TB24: ORAL_TABLET | ORAL | 1 refills | Status: DC

## 2019-04-09 ENCOUNTER — Telehealth: Payer: Self-pay

## 2019-04-09 DIAGNOSIS — I129 Hypertensive chronic kidney disease with stage 1 through stage 4 chronic kidney disease, or unspecified chronic kidney disease: Secondary | ICD-10-CM

## 2019-04-09 MED ORDER — METOPROLOL SUCCINATE ER 100 MG PO TB24: 100.0000 mg | ORAL_TABLET | Freq: Every day | ORAL | 1 refills | Status: DC

## 2019-04-09 NOTE — Telephone Encounter (Signed)
Pharmacy sent a fax asking for [place] to please clarify the directions on the metoprolol RX and resend to the pharmacy. Please advise. Directions are incomplete on current RX in chart.

## 2019-05-04 IMAGING — MG DIGITAL SCREENING BILATERAL MAMMOGRAM WITH TOMO AND CAD
8 series · 8 of 24 positions shown · non-contrast
Comparison: Previous exam(s).

CLINICAL DATA: Screening.

EXAM:
DIGITAL SCREENING BILATERAL MAMMOGRAM WITH TOMO AND CAD

[L CC synth-2D]
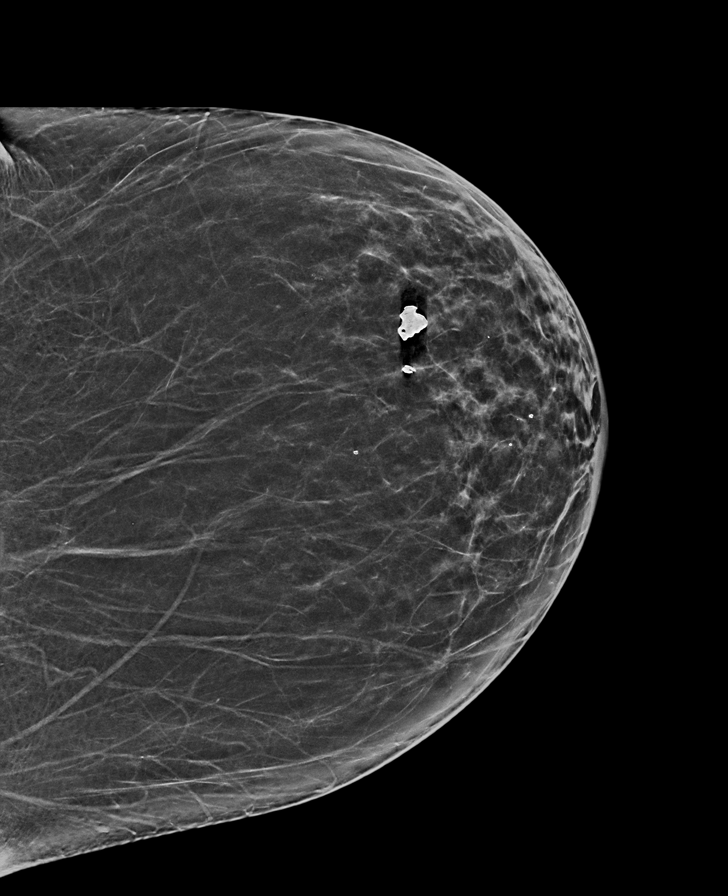

[R CC synth-2D]
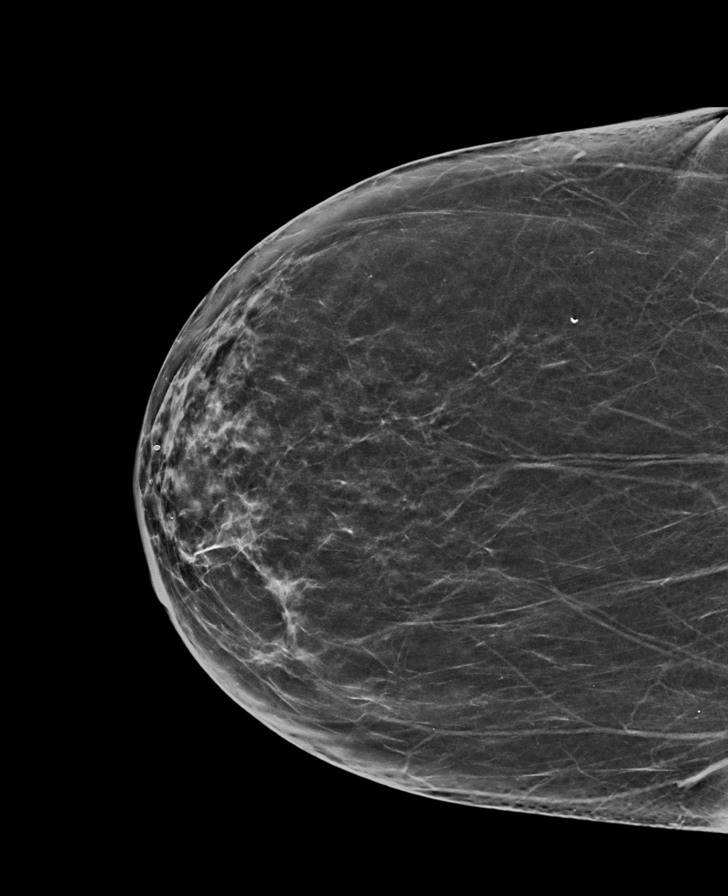

[L MLO synth-2D]
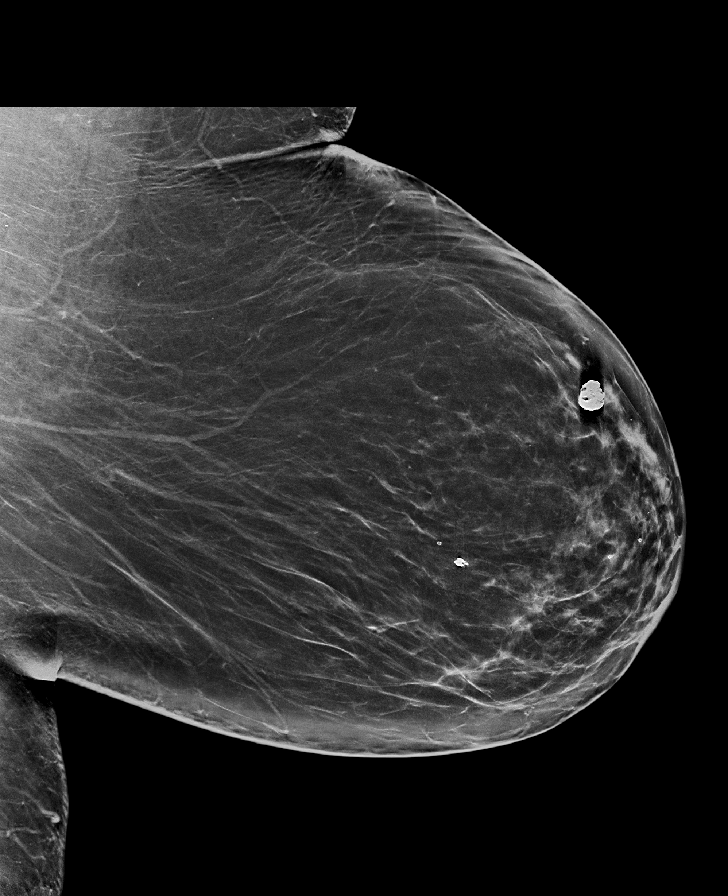

[R MLO synth-2D]
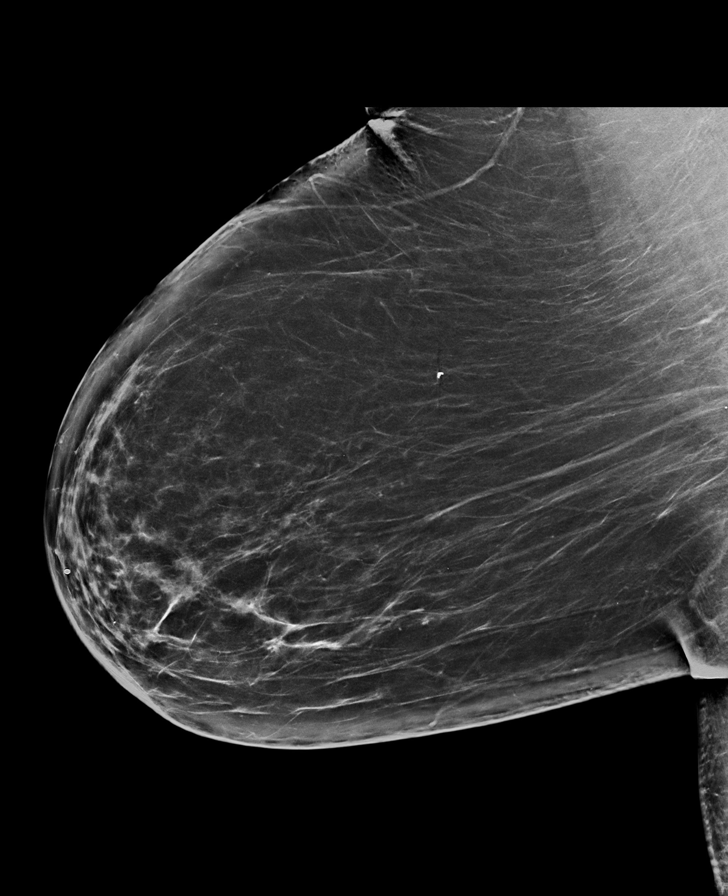

[L CC tomo · tomo slice 35/68.0]
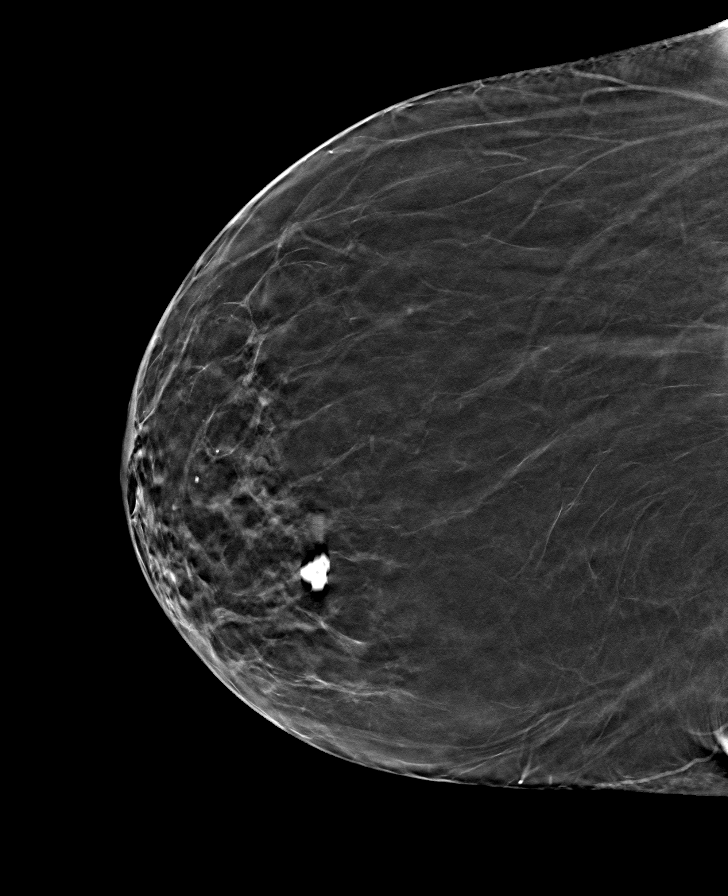

[R CC tomo · tomo slice 36/71.0]
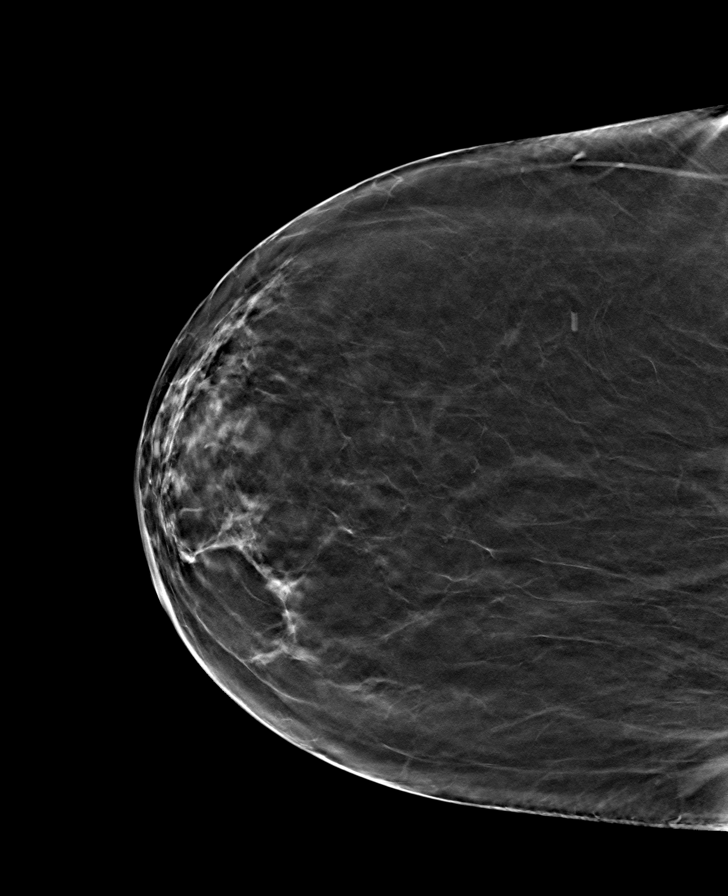

[L MLO tomo · tomo slice 43/85.0]
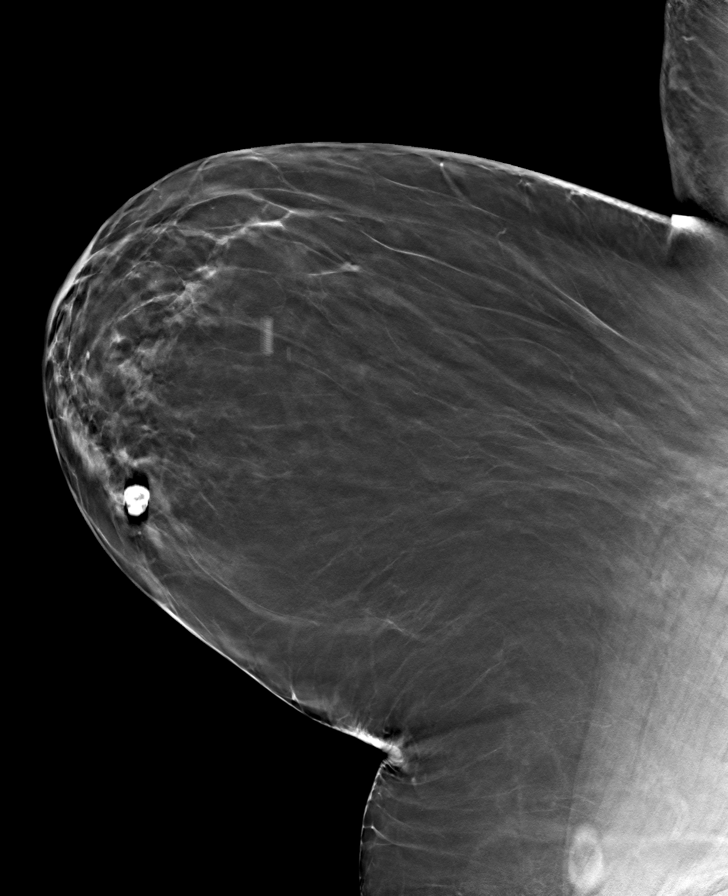

[R MLO tomo · tomo slice 43/85.0]
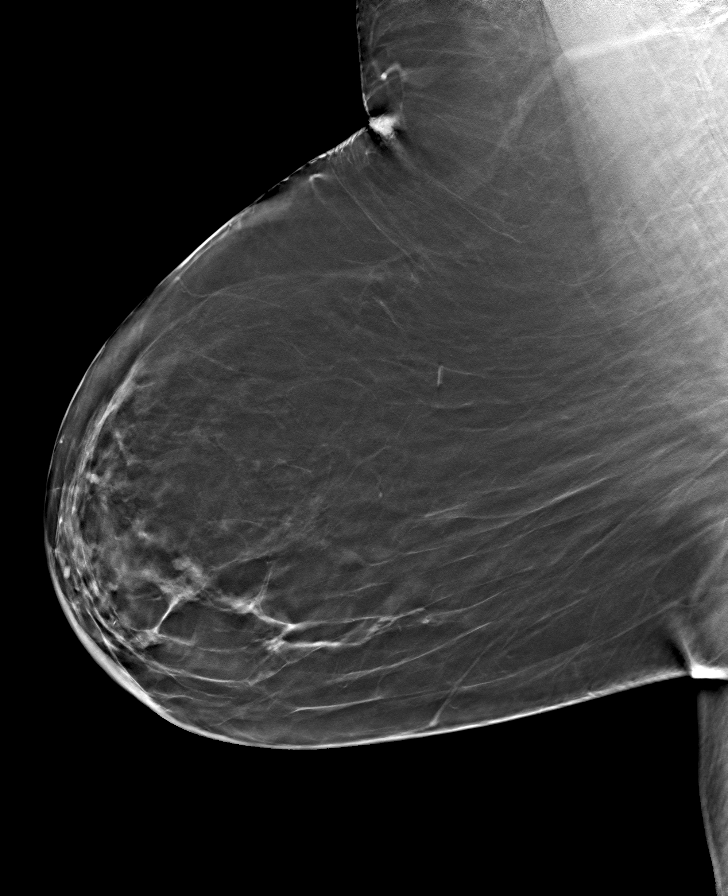

[8 of 24 positions shown; findings below may reference images not displayed]

ACR Breast Density Category b: There are scattered areas of
fibroglandular density.
FINDINGS: There are no findings suspicious for malignancy. Images were
processed with CAD.
IMPRESSION: No mammographic evidence of malignancy. A result letter of this
screening mammogram will be mailed directly to the patient.

RECOMMENDATION:
Screening mammogram in one year. (Code:[id number])

BI-RADS CATEGORY  1: Negative.

## 2019-05-09 ENCOUNTER — Telehealth: Payer: Self-pay | Admitting: Family Medicine

## 2019-05-09 NOTE — Telephone Encounter (Signed)
Medication: mirabegron ER (MYRBETRIQ) 25 MG TB24 tablet   Would like 90 day supply sent to  [place], [place] [phone] (Phone) [phone] (Fax)

## 2019-05-16 MED ORDER — MIRABEGRON ER 25 MG PO TB24: 25.0000 mg | ORAL_TABLET | Freq: Every day | ORAL | 6 refills | Status: DC

## 2019-05-16 NOTE — Telephone Encounter (Signed)
Patient notified that medication has been transferred

## 2019-05-16 NOTE — Telephone Encounter (Signed)
Pt following up on Rx request to have this Rx transferred from local pharmacy to [facility]. Pt states it is cheaper for her. Pt called 8/26 and thought this would have been done. Can you please transfer mirabegron ER (MYRBETRIQ) 25 MG TB24 tablet (which is at [facility])  to [place], [place] [phone] (Phone) [phone] (Fax)

## 2019-09-27 ENCOUNTER — Encounter: Payer: Self-pay | Admitting: Family Medicine

## 2019-09-27 ENCOUNTER — Ambulatory Visit (INDEPENDENT_AMBULATORY_CARE_PROVIDER_SITE_OTHER): Payer: Medicare Other | Admitting: Family Medicine

## 2019-09-27 ENCOUNTER — Other Ambulatory Visit: Payer: Self-pay

## 2019-09-27 DIAGNOSIS — I129 Hypertensive chronic kidney disease with stage 1 through stage 4 chronic kidney disease, or unspecified chronic kidney disease: Secondary | ICD-10-CM

## 2019-09-27 DIAGNOSIS — N3281 Overactive bladder: Secondary | ICD-10-CM

## 2019-09-27 DIAGNOSIS — E559 Vitamin D deficiency, unspecified: Secondary | ICD-10-CM | POA: Diagnosis not present

## 2019-09-27 DIAGNOSIS — E7800 Pure hypercholesterolemia, unspecified: Secondary | ICD-10-CM

## 2019-09-27 MED ORDER — VALSARTAN-HYDROCHLOROTHIAZIDE 160-25 MG PO TABS: 1.0000 | ORAL_TABLET | Freq: Every day | ORAL | 1 refills | Status: DC

## 2019-09-27 MED ORDER — AMLODIPINE BESYLATE 5 MG PO TABS: 5.0000 mg | ORAL_TABLET | Freq: Every day | ORAL | 1 refills | Status: DC

## 2019-09-27 MED ORDER — ATORVASTATIN CALCIUM 20 MG PO TABS: 20.0000 mg | ORAL_TABLET | Freq: Every day | ORAL | 1 refills | Status: DC

## 2019-09-27 MED ORDER — METOPROLOL SUCCINATE ER 100 MG PO TB24: 100.0000 mg | ORAL_TABLET | Freq: Every day | ORAL | 1 refills | Status: DC

## 2019-09-27 MED ORDER — MIRABEGRON ER 25 MG PO TB24: 25.0000 mg | ORAL_TABLET | Freq: Every day | ORAL | 1 refills | Status: DC

## 2019-09-27 NOTE — Assessment & Plan Note (Signed)
Feeling well. Will check BP next week. Continue current regimen. Continue to monitor. Call with any concerns. Refills given. Labs to be checked next week.

## 2019-09-27 NOTE — Assessment & Plan Note (Signed)
Rechecking labs next week. Call with any concerns. Treat as needed.

## 2019-09-27 NOTE — Assessment & Plan Note (Signed)
Under good control on current regimen. Continue current regimen. Continue to monitor. Call with any concerns. Refills given. Labs to be checked next week.   

## 2019-09-27 NOTE — Assessment & Plan Note (Signed)
Under good control on current regimen. Continue current regimen. Continue to monitor. Call with any concerns. Refills given.   

## 2019-09-27 NOTE — Progress Notes (Signed)
LMP  (LMP Unknown)    Subjective:    Patient ID: Anne Bell, female    DOB: [birth year], 69 y.o.   MRN: [id number]  HPI: Anne Bell is a 69 y.o. female  Chief Complaint  Patient presents with  . Hyperlipidemia  . Hypertension   HYPERTENSION / [place] Satisfied with current treatment? yes Duration of hypertension: chronic BP monitoring frequency: not checking BP medication side effects: no Past BP meds: valsartan, HCTZ, metoprolol, amlodipine Duration of hyperlipidemia: chronic Cholesterol medication side effects: no Cholesterol supplements: none Past cholesterol medications: atorvastatin Medication compliance: excellent compliance Aspirin: no Recent stressors: yes Recurrent headaches: no Visual changes: no Palpitations: no Dyspnea: no Chest pain: no Lower extremity edema: no Dizzy/lightheaded: no  Relevant past medical, surgical, family and social history reviewed and updated as indicated. Interim medical history since our last visit reviewed. Allergies and medications reviewed and updated.  Review of Systems  Constitutional: Negative.   Respiratory: Negative.   Cardiovascular: Negative.   Gastrointestinal: Negative.   Genitourinary: Negative.   Musculoskeletal: Negative.   Neurological: Positive for dizziness. Negative for tremors, seizures, syncope, facial asymmetry, speech difficulty, weakness, light-headedness, numbness and headaches.  Psychiatric/Behavioral: Negative.     Per HPI unless specifically indicated above     Objective:    LMP  (LMP Unknown)   Wt Readings from Last 3 Encounters:  03/27/19 221 lb (100.2 kg)  11/09/18 223 lb (101.2 kg)  07/27/18 226 lb 6.4 oz (102.7 kg)    Physical Exam Vitals and nursing note reviewed.  Pulmonary:     Effort: Pulmonary effort is normal. No respiratory distress.     Comments: Speaking in full sentences Neurological:     Mental Status: She is alert.  Psychiatric:        Mood and Affect:  Mood normal.        Behavior: Behavior normal.        Thought Content: Thought content normal.        Judgment: Judgment normal.     Results for orders placed or performed in visit on 03/27/19  Microscopic Examination   URINE  Result Value Ref Range   WBC, UA 0-5 0 - 5 /hpf   RBC None seen 0 - 2 /hpf   Epithelial Cells (non renal) 0-10 0 - 10 /hpf   Bacteria, UA Few (A) None seen/Few  Urine Culture, Reflex   URINE  Result Value Ref Range   Urine Culture, Routine Final report    Organism ID, Bacteria Comment   CBC with Differential/Platelet  Result Value Ref Range   WBC 7.0 3.4 - 10.8 x10E3/uL   RBC 4.80 3.77 - 5.28 x10E6/uL   Hemoglobin 14.6 11.1 - 15.9 g/dL   Hematocrit 45.0 34.0 - 46.6 %   MCV 94 79 - 97 fL   MCH 30.4 26.6 - 33.0 pg   MCHC 32.4 31.5 - 35.7 g/dL   RDW 12.7 11.7 - 15.4 %   Platelets 206 150 - 450 x10E3/uL   Neutrophils 68 Not Estab. %   Lymphs 20 Not Estab. %   Monocytes 7 Not Estab. %   Eos 4 Not Estab. %   Basos 1 Not Estab. %   Neutrophils Absolute 4.7 1.4 - 7.0 x10E3/uL   Lymphocytes Absolute 1.4 0.7 - 3.1 x10E3/uL   Monocytes Absolute 0.5 0.1 - 0.9 x10E3/uL   EOS (ABSOLUTE) 0.3 0.0 - 0.4 x10E3/uL   Basophils Absolute 0.0 0.0 - 0.2 x10E3/uL   Immature Granulocytes 0  Not Estab. %   Immature Grans (Abs) 0.0 0.0 - 0.1 x10E3/uL  Comprehensive metabolic panel  Result Value Ref Range   Glucose 100 (H) 65 - 99 mg/dL   BUN 20 8 - 27 mg/dL   Creatinine, Ser 0.79 0.57 - 1.00 mg/dL   GFR calc non Af Amer 77 >59 mL/min/1.73   GFR calc Af Amer 89 >59 mL/min/1.73   BUN/Creatinine Ratio 25 12 - 28   Sodium 140 134 - 144 mmol/L   Potassium 4.5 3.5 - 5.2 mmol/L   Chloride 99 96 - 106 mmol/L   CO2 28 20 - 29 mmol/L   Calcium 10.9 (H) 8.7 - 10.3 mg/dL   Total Protein 6.8 6.0 - 8.5 g/dL   Albumin 4.6 3.8 - 4.8 g/dL   Globulin, Total 2.2 1.5 - 4.5 g/dL   Albumin/Globulin Ratio 2.1 1.2 - 2.2   Bilirubin Total 0.4 0.0 - 1.2 mg/dL   Alkaline Phosphatase 86 39  - 117 IU/L   AST 22 0 - 40 IU/L   ALT 23 0 - 32 IU/L  Lipid Panel w/o Chol/HDL Ratio  Result Value Ref Range   Cholesterol, Total 162 100 - 199 mg/dL   Triglycerides 122 0 - 149 mg/dL   HDL 52 >39 mg/dL   VLDL Cholesterol Cal 24 5 - 40 mg/dL   LDL Calculated 86 0 - 99 mg/dL  Microalbumin, Urine Waived  Result Value Ref Range   Microalb, Ur Waived 10 0 - 19 mg/L   Creatinine, Urine Waived 50 10 - 300 mg/dL   Microalb/Creat Ratio 30-300 (H) <30 mg/g  TSH  Result Value Ref Range   TSH 1.100 0.450 - 4.500 uIU/mL  UA/M w/rflx Culture, Routine   Specimen: Urine   URINE  Result Value Ref Range   Specific Gravity, UA 1.020 1.005 - 1.030   pH, UA 7.0 5.0 - 7.5   Color, UA Yellow Yellow   Appearance Ur Clear Clear   Leukocytes,UA Trace (A) Negative   Protein,UA Negative Negative/Trace   Glucose, UA Negative Negative   Ketones, UA Negative Negative   RBC, UA Negative Negative   Bilirubin, UA Negative Negative   Urobilinogen, Ur 0.2 0.2 - 1.0 mg/dL   Nitrite, UA Negative Negative   Microscopic Examination See below:    Urinalysis Reflex Comment   VITAMIN D 25 Hydroxy (Vit-D Deficiency, Fractures)  Result Value Ref Range   Vit D, 25-Hydroxy 31.6 30.0 - 100.0 ng/mL      Assessment & Plan:   Problem List Items Addressed This Visit      Genitourinary   Benign hypertensive renal disease    Feeling well. Will check BP next week. Continue current regimen. Continue to monitor. Call with any concerns. Refills given. Labs to be checked next week.       Relevant Medications   valsartan-hydrochlorothiazide (DIOVAN-HCT) 160-25 MG tablet   metoprolol succinate (TOPROL-XL) 100 MG 24 hr tablet   amLODipine (NORVASC) 5 MG tablet   Other Relevant Orders   Comp Met (CMET)   Microalbumin, Urine Waived   Overactive bladder    Under good control on current regimen. Continue current regimen. Continue to monitor. Call with any concerns. Refills given.          Other   Hypercholesteremia      Under good control on current regimen. Continue current regimen. Continue to monitor. Call with any concerns. Refills given. Labs to be checked next week.        Relevant  Medications   valsartan-hydrochlorothiazide (DIOVAN-HCT) 160-25 MG tablet   metoprolol succinate (TOPROL-XL) 100 MG 24 hr tablet   atorvastatin (LIPITOR) 20 MG tablet   amLODipine (NORVASC) 5 MG tablet   Other Relevant Orders   Comp Met (CMET)   Lipid Panel w/o Chol/HDL Ratio OUT   Avitaminosis D - Primary    Rechecking labs next week. Call with any concerns. Treat as needed.       Relevant Orders   Vit D  25 hydroxy (rtn osteoporosis monitoring)       Follow up plan: Return in about 6 months (around 03/26/2020) for physical.    . This visit was completed via telephone due to the restrictions of the COVID-19 pandemic. All issues as above were discussed and addressed but no physical exam was performed. If it was felt that the patient should be evaluated in the office, they were directed there. The patient verbally consented to this visit. Patient was unable to complete an audio/visual visit due to Lack of equipment. Due to the catastrophic nature of the COVID-19 pandemic, this visit was done through audio contact only. . Location of the patient: home . Location of the provider: home . Those involved with this call:  . Provider: Park Liter, DO . CMA: Tiffany Reel, CMA . Front Desk/Registration: Don Perking  . Time spent on call: 25 minutes on the phone discussing health concerns. 40 minutes total spent in review of patient's record and preparation of their chart.

## 2019-09-28 ENCOUNTER — Encounter: Payer: Self-pay | Admitting: Family Medicine

## 2019-09-28 NOTE — Progress Notes (Signed)
LVM and printed letter for pt to fu for cpe around 03/28/20

## 2019-10-02 ENCOUNTER — Other Ambulatory Visit: Payer: Medicare Other

## 2019-10-02 ENCOUNTER — Other Ambulatory Visit: Payer: Self-pay

## 2019-10-02 ENCOUNTER — Ambulatory Visit: Payer: Medicare Other

## 2019-10-02 DIAGNOSIS — I129 Hypertensive chronic kidney disease with stage 1 through stage 4 chronic kidney disease, or unspecified chronic kidney disease: Secondary | ICD-10-CM

## 2019-10-02 DIAGNOSIS — E559 Vitamin D deficiency, unspecified: Secondary | ICD-10-CM | POA: Diagnosis not present

## 2019-10-02 DIAGNOSIS — E7800 Pure hypercholesterolemia, unspecified: Secondary | ICD-10-CM | POA: Diagnosis not present

## 2019-10-02 LAB — MICROALBUMIN, URINE WAIVED
Creatinine, Urine Waived: 10 mg/dL (ref 10–300)
Microalb, Ur Waived: 10 mg/L (ref 0–19)

## 2019-10-03 ENCOUNTER — Encounter: Payer: Self-pay | Admitting: Family Medicine

## 2019-10-03 LAB — LIPID PANEL W/O CHOL/HDL RATIO
Cholesterol, Total: 149 mg/dL (ref 100–199)
HDL: 53 mg/dL (ref >39)
LDL Chol Calc (NIH): 75 mg/dL (ref 0–99)
Triglycerides: 116 mg/dL (ref 0–149)
VLDL Cholesterol Cal: 21 mg/dL (ref 5–40)

## 2019-10-03 LAB — COMPREHENSIVE METABOLIC PANEL
ALT: 23 IU/L (ref 0–32)
AST: 25 IU/L (ref 0–40)
Albumin/Globulin Ratio: 1.7 (ref 1.2–2.2)
Albumin: 4.2 g/dL (ref 3.8–4.8)
Alkaline Phosphatase: 88 IU/L (ref 39–117)
BUN/Creatinine Ratio: 24 (ref 12–28)
BUN: 16 mg/dL (ref 8–27)
Bilirubin Total: 0.6 mg/dL (ref 0.0–1.2)
CO2: 24 mmol/L (ref 20–29)
Calcium: 10.4 mg/dL — ABNORMAL HIGH (ref 8.7–10.3)
Chloride: 100 mmol/L (ref 96–106)
Creatinine, Ser: 0.66 mg/dL (ref 0.57–1.00)
GFR calc Af Amer: 104 mL/min/{1.73_m2} (ref >59)
GFR calc non Af Amer: 90 mL/min/{1.73_m2} (ref >59)
Globulin, Total: 2.5 g/dL (ref 1.5–4.5)
Glucose: 91 mg/dL (ref 65–99)
Potassium: 4.1 mmol/L (ref 3.5–5.2)
Sodium: 138 mmol/L (ref 134–144)
Total Protein: 6.7 g/dL (ref 6.0–8.5)

## 2019-10-03 LAB — VITAMIN D 25 HYDROXY (VIT D DEFICIENCY, FRACTURES): Vit D, 25-Hydroxy: 34.7 ng/mL (ref 30.0–100.0)

## 2019-10-09 ENCOUNTER — Other Ambulatory Visit: Payer: Self-pay | Admitting: Family Medicine

## 2019-10-09 DIAGNOSIS — Z1231 Encounter for screening mammogram for malignant neoplasm of breast: Secondary | ICD-10-CM

## 2019-10-15 ENCOUNTER — Other Ambulatory Visit: Payer: Self-pay | Admitting: Family Medicine

## 2019-10-15 DIAGNOSIS — E7800 Pure hypercholesterolemia, unspecified: Secondary | ICD-10-CM

## 2019-10-15 DIAGNOSIS — I129 Hypertensive chronic kidney disease with stage 1 through stage 4 chronic kidney disease, or unspecified chronic kidney disease: Secondary | ICD-10-CM

## 2019-10-15 NOTE — Telephone Encounter (Signed)
Future visit scheduled: yes  Notes to clinic:  Requesting 1 year supply   Requested Prescriptions  Pending Prescriptions Disp Refills   valsartan-hydrochlorothiazide (DIOVAN-HCT) 160-25 MG tablet [Pharmacy Med Name: VALSARTAN/HCTZ 160-25MG  TABLET] 90 tablet 3    Sig: Take 1 tablet by mouth daily.      Cardiovascular: ARB + Diuretic Combos Failed - 10/15/2019  6:12 AM      Failed - Ca in normal range and within 180 days    Calcium  Date Value Ref Range Status  10/02/2019 10.4 (H) 8.7 - 10.3 mg/dL Final   Calcium, Total  Date Value Ref Range Status  10/10/2012 9.6 8.5 - 10.1 mg/dL Final          Failed - Last BP in normal range    BP Readings from Last 1 Encounters:  10/02/19 (!) 145/79          Passed - K in normal range and within 180 days    Potassium  Date Value Ref Range Status  10/02/2019 4.1 3.5 - 5.2 mmol/L Final  10/10/2012 4.1 3.5 - 5.1 mmol/L Final          Passed - Na in normal range and within 180 days    Sodium  Date Value Ref Range Status  10/02/2019 138 134 - 144 mmol/L Final  10/10/2012 138 136 - 145 mmol/L Final          Passed - Cr in normal range and within 180 days    Creatinine  Date Value Ref Range Status  10/10/2012 0.79 0.60 - 1.30 mg/dL Final   Creatinine, Ser  Date Value Ref Range Status  10/02/2019 0.66 0.57 - 1.00 mg/dL Final          Passed - Patient is not pregnant      Passed - Valid encounter within last 6 months    Recent Outpatient Visits           2 weeks ago Avitaminosis D   [place], Megan P, DO   6 months ago Routine general medical examination at a health care facility   [facility], [place], DO   11 months ago Acute lower UTI   [facility] [place], [place]   1 year ago Benign hypertensive renal disease   [facility] [place], Megan P, DO   1 year ago Routine general medical examination at a health care facility   [facility], [place] P, DO       Future Appointments             In 5 months Johnson, Megan P, DO [place], [facility]              metoprolol succinate (TOPROL-XL) 100 MG 24 hr tablet [Pharmacy Med Name: METOPROLOL SUCC ER 100MG  TABLET] 90 tablet 3    Sig: TAKE 1 TABLET BY MOUTH  DAILY WITH OR IMMEDIATELY  FOLLOWING A MEAL      Cardiovascular:  Beta Blockers Failed - 10/15/2019  6:12 AM      Failed - Last BP in normal range    BP Readings from Last 1 Encounters:  10/02/19 (!) 145/79          Passed - Last Heart Rate in normal range    Pulse Readings from Last 1 Encounters:  10/02/19 (!) 59          Passed - Valid encounter within last 6 months  Recent Outpatient Visits           2 weeks ago Avitaminosis D   [facility] [place], Megan P, DO   6 months ago Routine general medical examination at a health care facility   [facility], [place] P, [place]   11 months ago Acute lower UTI   [facility] [place], [place]   1 year ago Benign hypertensive renal disease   [facility] [place], Megan P, DO   1 year ago Routine general medical examination at a health care facility   [facility], Oralia Rud, DO       Future Appointments             In 5 months Laural Benes, Megan P, DO [facility], [facility]              atorvastatin (LIPITOR) 20 MG tablet [Pharmacy Med Name: ATORVASTATIN  20MG   TAB] 90 tablet 3    Sig: TAKE 1 TABLET BY MOUTH  DAILY      Cardiovascular:  Antilipid - Statins Passed - 10/15/2019  6:12 AM      Passed - Total Cholesterol in normal range and within 360 days    Cholesterol, Total  Date Value Ref Range Status  10/02/2019 149 100 - 199 mg/dL Final   Cholesterol  Date Value Ref Range Status  10/10/2012 175 0 - 200 mg/dL Final   Cholesterol Piccolo, Waived  Date Value Ref Range Status  02/24/2016 158 <200 mg/dL Final    Comment:                             Desirable                <200                         Borderline High      200- 239                         High                     >239           Passed - LDL in normal range and within 360 days    Ldl Cholesterol, Calc  Date Value Ref Range Status  10/10/2012 88 0 - 100 mg/dL Final   LDL Chol Calc (NIH)  Date Value Ref Range Status  10/02/2019 75 0 - 99 mg/dL Final          Passed - HDL in normal range and within 360 days    HDL Cholesterol  Date Value Ref Range Status  10/10/2012 49 40 - 60 mg/dL Final   HDL  Date Value Ref Range Status  10/02/2019 53 >39 mg/dL Final          Passed - Triglycerides in normal range and within 360 days    Triglycerides  Date Value Ref Range Status  10/02/2019 116 0 - 149 mg/dL Final  10/04/2019 16/06/9603 0 - 200 mg/dL Final   Triglycerides Piccolo,Waived  Date Value Ref Range Status  02/24/2016 157 (H) <150 mg/dL Final    Comment:                            Normal                   <  150                         Borderline High     150 - 199                         High                200 - 499                         Very High                >499           Passed - Patient is not pregnant      Passed - Valid encounter within last 12 months    Recent Outpatient Visits           2 weeks ago Avitaminosis D   [facility] [place], Megan P, DO   6 months ago Routine general medical examination at a health care facility   [facility], [place], DO   11 months ago Acute lower UTI   [facility] Particia [place], [place]   1 year ago Benign hypertensive renal disease   [facility] [place], Megan P, DO   1 year ago Routine general medical examination at a health care facility   [facility], [place] P, DO       Future Appointments             In 5 months Johnson, Megan P, DO [facility], [facility]               amLODipine (NORVASC) 5 MG tablet [Pharmacy Med Name: AMLODIPINE  5MG   TAB] 90 tablet 3    Sig: TAKE 1 TABLET BY MOUTH  DAILY      Cardiovascular:  Calcium Channel Blockers Failed - 10/15/2019  6:12 AM      Failed - Last BP in normal range    BP Readings from Last 1 Encounters:  10/02/19 (!) 145/79          Passed - Valid encounter within last 6 months    Recent Outpatient Visits           2 weeks ago Avitaminosis D   [facility] [place], Megan P, DO   6 months ago Routine general medical examination at a health care facility   [facility], [place], DO   11 months ago Acute lower UTI   [facility], [place]   1 year ago Benign hypertensive renal disease   [facility] [place], Megan P, DO   1 year ago Routine general medical examination at a health care facility   [facility] [place], [place], DO       Future Appointments             In 5 months Oralia Rud, Laural Benes, DO [facility], [facility]

## 2019-10-15 NOTE — Telephone Encounter (Signed)
Routing to provider  

## 2019-11-09 ENCOUNTER — Ambulatory Visit: Payer: Medicare Other | Attending: Internal Medicine

## 2019-11-09 DIAGNOSIS — Z23 Encounter for immunization: Secondary | ICD-10-CM | POA: Insufficient documentation

## 2019-11-09 NOTE — Progress Notes (Signed)
   Covid-19 Vaccination Clinic  Name:  Anne Bell    MRN: [id number] DOB: 19-Feb-1951  11/09/2019  Anne Bell was observed post Covid-19 immunization for 15 minutes without incidence. She was provided with Vaccine Information Sheet and instruction to access the V-Safe system.   Anne Bell was instructed to call 911 with any severe reactions post vaccine: Marland Kitchen Difficulty breathing  . Swelling of your face and throat  . A fast heartbeat  . A bad rash all over your body  . Dizziness and weakness    Immunizations Administered    Name Date Dose VIS Date Route   Pfizer COVID-19 Vaccine 11/09/2019  9:14 AM 0.3 mL 08/24/2019 Intramuscular   Manufacturer: [employer], [employer]   Lot: [id number]   NDC: [address]-3

## 2019-11-19 ENCOUNTER — Ambulatory Visit
Admission: RE | Admit: 2019-11-19 | Discharge: 2019-11-19 | Disposition: A | Discharge status: Home / Self Care | Payer: Medicare Other | Source: Ambulatory Visit | Attending: Family Medicine | Admitting: Family Medicine

## 2019-11-19 ENCOUNTER — Encounter: Payer: Self-pay | Admitting: Family Medicine

## 2019-11-19 DIAGNOSIS — Z1231 Encounter for screening mammogram for malignant neoplasm of breast: Secondary | ICD-10-CM | POA: Diagnosis not present

## 2019-11-23 ENCOUNTER — Other Ambulatory Visit: Payer: Self-pay | Admitting: Family Medicine

## 2019-11-23 DIAGNOSIS — I129 Hypertensive chronic kidney disease with stage 1 through stage 4 chronic kidney disease, or unspecified chronic kidney disease: Secondary | ICD-10-CM

## 2019-11-23 MED ORDER — MIRABEGRON ER 25 MG PO TB24: 25.0000 mg | ORAL_TABLET | Freq: Every day | ORAL | 1 refills | Status: DC

## 2019-11-23 NOTE — Telephone Encounter (Signed)
Requested Prescriptions  Pending Prescriptions Disp Refills  . mirabegron ER (MYRBETRIQ) 25 MG TB24 tablet 90 tablet 1    Sig: Take 1 tablet (25 mg total) by mouth daily.     Urology: Bladder Agents - mirabegron Failed - 11/23/2019  8:07 AM      Failed - Last BP in normal range    BP Readings from Last 1 Encounters:  10/02/19 (!) 145/79         Passed - Valid encounter within last 12 months    Recent Outpatient Visits          1 month ago Avitaminosis D   [facility] [place], [place], DO   8 months ago Routine general medical examination at a health care facility   [facility] [place], [place], DO   1 year ago Acute lower UTI   [facility] [place], [place]   1 year ago Benign hypertensive renal disease   [facility] [place], Megan P, DO   1 year ago Routine general medical examination at a health care facility   [facility] [place], Oralia Rud, DO      Future Appointments            In 4 months Laural Benes, Oralia Rud, DO [facility], [facility]

## 2019-11-23 NOTE — Telephone Encounter (Signed)
Medication Refill - Medication: mirabegron ER (MYRBETRIQ) 25 MG TB24 tablet   90 day supply  Has the patient contacted their pharmacy? Yes.   (Agent: If no, request that the patient contact the pharmacy for the refill.) (Agent: If yes, when and what did the pharmacy advise?)  Preferred Pharmacy (with phone number or street name):  [facility] - [place], [place] - [address] Phone:  [phone]  Fax:  [phone]       Agent: Please be advised that RX refills may take up to 3 business days. We ask that you follow-up with your pharmacy.

## 2019-11-30 ENCOUNTER — Telehealth: Payer: Self-pay | Admitting: Family Medicine

## 2019-11-30 DIAGNOSIS — I129 Hypertensive chronic kidney disease with stage 1 through stage 4 chronic kidney disease, or unspecified chronic kidney disease: Secondary | ICD-10-CM

## 2019-11-30 DIAGNOSIS — E7800 Pure hypercholesterolemia, unspecified: Secondary | ICD-10-CM

## 2019-11-30 NOTE — Telephone Encounter (Signed)
Pt called stating that her metoprolol is running out and that she only has 5 pills and is requesting to have that sent in. Please advise.

## 2019-11-30 NOTE — Telephone Encounter (Signed)
Called and notified patient  that a prescription was sent into [facility] in January for 90 with a refill.

## 2019-12-04 ENCOUNTER — Ambulatory Visit: Payer: Medicare Other | Attending: Internal Medicine

## 2019-12-04 DIAGNOSIS — Z23 Encounter for immunization: Secondary | ICD-10-CM

## 2019-12-04 NOTE — Progress Notes (Signed)
   Covid-19 Vaccination Clinic  Name:  REAGHAN KAWA    MRN: [id number] DOB: 07-21-1951  12/04/2019  Ms. Liberto was observed post Covid-19 immunization for 15 minutes without incident. She was provided with Vaccine Information Sheet and instruction to access the V-Safe system.   Ms. Muramoto was instructed to call 911 with any severe reactions post vaccine: Marland Kitchen Difficulty breathing  . Swelling of face and throat  . A fast heartbeat  . A bad rash all over body  . Dizziness and weakness   Immunizations Administered    Name Date Dose VIS Date Route   Pfizer COVID-19 Vaccine 12/04/2019  1:32 PM 0.3 mL 08/24/2019 Intramuscular   Manufacturer: [employer], [employer]   Lot: [id number]   NDC: [address]-5

## 2019-12-19 ENCOUNTER — Other Ambulatory Visit: Payer: Self-pay | Admitting: Family Medicine

## 2019-12-19 DIAGNOSIS — E7800 Pure hypercholesterolemia, unspecified: Secondary | ICD-10-CM

## 2019-12-19 DIAGNOSIS — I129 Hypertensive chronic kidney disease with stage 1 through stage 4 chronic kidney disease, or unspecified chronic kidney disease: Secondary | ICD-10-CM

## 2020-02-13 ENCOUNTER — Ambulatory Visit: Payer: Medicare Other

## 2020-02-13 ENCOUNTER — Ambulatory Visit (INDEPENDENT_AMBULATORY_CARE_PROVIDER_SITE_OTHER): Payer: Medicare Other

## 2020-02-13 VITALS — Ht 61.0 in | Wt 221.0 lb

## 2020-02-13 DIAGNOSIS — Z0000 Encounter for general adult medical examination without abnormal findings: Secondary | ICD-10-CM

## 2020-02-13 NOTE — Patient Instructions (Signed)
Anne Bell , Thank you for taking time to come for your Medicare Wellness Visit. I appreciate your ongoing commitment to your health goals. Please review the following plan we discussed and let me know if I can assist you in the future.   Screening recommendations/referrals: Colonoscopy: cologuard completed 08/15/2018 due 2022 Mammogram: completed 11/19/2019 Bone Density: completed 08/05/2015 Recommended yearly ophthalmology/optometry visit for glaucoma screening and checkup Recommended yearly dental visit for hygiene and checkup  Vaccinations: Influenza vaccine: due 05/2020 Pneumococcal vaccine: completed series  Tdap vaccine: completed 02/23/2016 Shingles vaccine: shingrix eligible, check with your pharmacy for coverage   Covid-19:completed series   Advanced directives: Please bring a copy of your health care power of attorney and living will to the office at your convenience.  Conditions/risks identified:  recommend starting a routine exercise program at least 3 days a week for 30-45 minutes at a time as tolerated.   Next appointment: Follow up in one year for your annual wellness visit 02/19/2021 at 1030am      Preventive Care 70 Years and Older, Female Preventive care refers to lifestyle choices and visits with your health care provider that can promote health and wellness. What does preventive care include?  A yearly physical exam. This is also called an annual well check.  Dental exams once or twice a year.  Routine eye exams. Ask your health care provider how often you should have your eyes checked.  Personal lifestyle choices, including:  Daily care of your teeth and gums.  Regular physical activity.  Eating a healthy diet.  Avoiding tobacco and drug use.  Limiting alcohol use.  Practicing safe sex.  Taking low-dose aspirin every day.  Taking vitamin and mineral supplements as recommended by your health care provider. What happens during an annual well  check? The services and screenings done by your health care provider during your annual well check will depend on your age, overall health, lifestyle risk factors, and family history of disease. Counseling  Your health care provider may ask you questions about your:  Alcohol use.  Tobacco use.  Drug use.  Emotional well-being.  Home and relationship well-being.  Sexual activity.  Eating habits.  History of falls.  Memory and ability to understand (cognition).  Work and work Astronomer.  Reproductive health. Screening  You may have the following tests or measurements:  Height, weight, and BMI.  Blood pressure.  Lipid and cholesterol levels. These may be checked every 5 years, or more frequently if you are over 80 years old.  Skin check.  Lung cancer screening. You may have this screening every year starting at age 54 if you have a 30-pack-year history of smoking and currently smoke or have quit within the past 15 years.  Fecal occult blood test (FOBT) of the stool. You may have this test every year starting at age 30.  Flexible sigmoidoscopy or colonoscopy. You may have a sigmoidoscopy every 5 years or a colonoscopy every 10 years starting at age 75.  Hepatitis C blood test.  Hepatitis B blood test.  Sexually transmitted disease (STD) testing.  Diabetes screening. This is done by checking your blood sugar (glucose) after you have not eaten for a while (fasting). You may have this done every 1-3 years.  Bone density scan. This is done to screen for osteoporosis. You may have this done starting at age 51.  Mammogram. This may be done every 1-2 years. Talk to your health care provider about how often you should have regular  mammograms. Talk with your health care provider about your test results, treatment options, and if necessary, the need for more tests. Vaccines  Your health care provider may recommend certain vaccines, such as:  Influenza vaccine. This is  recommended every year.  Tetanus, diphtheria, and acellular pertussis (Tdap, Td) vaccine. You may need a Td booster every 10 years.  Zoster vaccine. You may need this after age 62.  Pneumococcal 13-valent conjugate (PCV13) vaccine. One dose is recommended after age 65.  Pneumococcal polysaccharide (PPSV23) vaccine. One dose is recommended after age 82. Talk to your health care provider about which screenings and vaccines you need and how often you need them. This information is not intended to replace advice given to you by your health care provider. Make sure you discuss any questions you have with your health care provider. Document Released: 09/26/2015 Document Revised: 05/19/2016 Document Reviewed: 07/01/2015 Elsevier Interactive Patient Education  2017 [employer] Prevention in the Home Falls can cause injuries. They can happen to people of all ages. There are many things you can do to make your home safe and to help prevent falls. What can I do on the outside of my home?  Regularly fix the edges of walkways and driveways and fix any cracks.  Remove anything that might make you trip as you walk through a door, such as a raised step or threshold.  Trim any bushes or trees on the path to your home.  Use bright outdoor lighting.  Clear any walking paths of anything that might make someone trip, such as rocks or tools.  Regularly check to see if handrails are loose or broken. Make sure that both sides of any steps have handrails.  Any raised decks and porches should have guardrails on the edges.  Have any leaves, snow, or ice cleared regularly.  Use sand or salt on walking paths during winter.  Clean up any spills in your garage right away. This includes oil or grease spills. What can I do in the bathroom?  Use night lights.  Install grab bars by the toilet and in the tub and shower. Do not use towel bars as grab bars.  Use non-skid mats or decals in the tub or  shower.  If you need to sit down in the shower, use a plastic, non-slip stool.  Keep the floor dry. Clean up any water that spills on the floor as soon as it happens.  Remove soap buildup in the tub or shower regularly.  Attach bath mats securely with double-sided non-slip rug tape.  Do not have throw rugs and other things on the floor that can make you trip. What can I do in the bedroom?  Use night lights.  Make sure that you have a light by your bed that is easy to reach.  Do not use any sheets or blankets that are too big for your bed. They should not hang down onto the floor.  Have a firm chair that has side arms. You can use this for support while you get dressed.  Do not have throw rugs and other things on the floor that can make you trip. What can I do in the kitchen?  Clean up any spills right away.  Avoid walking on wet floors.  Keep items that you use a lot in easy-to-reach places.  If you need to reach something above you, use a strong step stool that has a grab bar.  Keep electrical cords out of the way.  Do not use floor polish or wax that makes floors slippery. If you must use wax, use non-skid floor wax.  Do not have throw rugs and other things on the floor that can make you trip. What can I do with my stairs?  Do not leave any items on the stairs.  Make sure that there are handrails on both sides of the stairs and use them. Fix handrails that are broken or loose. Make sure that handrails are as long as the stairways.  Check any carpeting to make sure that it is firmly attached to the stairs. Fix any carpet that is loose or worn.  Avoid having throw rugs at the top or bottom of the stairs. If you do have throw rugs, attach them to the floor with carpet tape.  Make sure that you have a light switch at the top of the stairs and the bottom of the stairs. If you do not have them, ask someone to add them for you. What else can I do to help prevent  falls?  Wear shoes that:  Do not have high heels.  Have rubber bottoms.  Are comfortable and fit you well.  Are closed at the toe. Do not wear sandals.  If you use a stepladder:  Make sure that it is fully opened. Do not climb a closed stepladder.  Make sure that both sides of the stepladder are locked into place.  Ask someone to hold it for you, if possible.  Clearly mark and make sure that you can see:  Any grab bars or handrails.  First and last steps.  Where the edge of each step is.  Use tools that help you move around (mobility aids) if they are needed. These include:  Canes.  Walkers.  Scooters.  Crutches.  Turn on the lights when you go into a dark area. Replace any light bulbs as soon as they burn out.  Set up your furniture so you have a clear path. Avoid moving your furniture around.  If any of your floors are uneven, fix them.  If there are any pets around you, be aware of where they are.  Review your medicines with your doctor. Some medicines can make you feel dizzy. This can increase your chance of falling. Ask your doctor what other things that you can do to help prevent falls. This information is not intended to replace advice given to you by your health care provider. Make sure you discuss any questions you have with your health care provider. Document Released: 06/26/2009 Document Revised: 02/05/2016 Document Reviewed: 10/04/2014 Elsevier Interactive Patient Education  2017 [employer].

## 2020-02-13 NOTE — Progress Notes (Signed)
Subjective:   Anne Bell is a 69 y.o. female who presents for Medicare Annual (Subsequent) preventive examination.  I connected with Anne Bell today by telephone and verified that I am speaking with the correct person using two identifiers. Location patient: home Location provider: work Persons participating in the virtual visit: patient, Engineer, civil (consulting).    I discussed the limitations, risks, security and privacy concerns of performing an evaluation and management service by telephone and the availability of in person appointments. I also discussed with the patient that there may be a patient responsible charge related to this service. The patient expressed understanding and verbally consented to this telephonic visit.    Interactive audio and video telecommunications were attempted between this provider and patient, however failed, due to patient having technical difficulties OR patient did not have access to video capability.  We continued and completed visit with audio only.  Some vital signs may be absent or patient reported.   Time Spent with patient on telephone encounter: 15 minutes   Review of Systems:   Cardiac Risk Factors include: advanced age (>78men, >83 women);dyslipidemia;hypertension     Objective:     Vitals: Ht 5\' 1"  (1.549 m)   Wt 221 lb (100.2 kg)   LMP  (LMP Unknown)   BMI 41.76 kg/m   Body mass index is 41.76 kg/m.  Advanced Directives 02/13/2020 02/22/2019 01/09/2018 12/28/2016 08/21/2015  Does Patient Have a Medical Advance Directive? Yes No Yes Yes No  Type of Advance Directive Living will;Healthcare Power of 14/04/2015 of [place];Living will Healthcare Power of [place];Living will -  Does patient want to make changes to medical advance directive? - - - No - Patient declined -  Copy of Healthcare Power of Attorney in Chart? No - copy requested - No - copy requested No - copy requested -  Would patient like information on creating a medical advance  directive? - - - - Yes - [place] given    Tobacco Social History   Tobacco Use  Smoking Status Never Smoker  Smokeless Tobacco Never Used     Counseling given: Not Answered   Clinical Intake:  Pre-visit preparation completed: Yes  Pain : No/denies pain     Nutritional Status: BMI > 30  Obese Nutritional Risks: None Diabetes: No  How often do you need to have someone help you when you read instructions, pamphlets, or other written materials from your doctor or pharmacy?: 1 - Never  Interpreter Needed?: No  Information entered by :: Anne Hanssen,LPN  Past Medical History:  Diagnosis Date  . Allergy   . Depression   . Hyperlipidemia   . Hypertension    Past Surgical History:  Procedure Laterality Date  . BREAST CYST ASPIRATION  1970's   ? side  . PLANTAR FASCIA SURGERY Left 2008   Family History  Problem Relation Age of Onset  . Alcohol abuse Father   . Mental illness Mother        Depression  . Cancer Mother        started in back   . Brain cancer Sister   . Breast cancer Neg Hx    Social History   Socioeconomic History  . Marital status: Divorced    Spouse name: Not on file  . Number of children: Not on file  . Years of education: Not on file  . Highest education level: Some college, no degree  Occupational History  . Occupation: Retired  Tobacco Use  . Smoking  status: Never Smoker  . Smokeless tobacco: Never Used  Substance and Sexual Activity  . Alcohol use: Yes    Comment: occasional  . Drug use: No  . Sexual activity: Yes    Birth control/protection: None  Other Topics Concern  . Not on file  Social History Narrative  . Not on file   Social Determinants of Health   Financial Resource Strain:   . Difficulty of Paying Living Expenses:   Food Insecurity:   . Worried About Programme researcher, broadcasting/film/video in the Last Year:   . Barista in the Last Year:   Transportation Needs:   . Freight forwarder (Medical):   Marland Kitchen Lack  of Transportation (Non-Medical):   Physical Activity: Insufficiently Active  . Days of Exercise per Week: 6 days  . Minutes of Exercise per Session: 10 min  Stress:   . Feeling of Stress :   Social Connections: Moderately Isolated  . Frequency of Communication with Friends and Family: More than three times a week  . Frequency of Social Gatherings with Friends and Family: More than three times a week  . Attends Religious Services: Never  . Active Member of Clubs or Organizations: No  . Attends Banker Meetings: Never  . Marital Status: Divorced    Outpatient Encounter Medications as of 02/13/2020  Medication Sig  . amLODipine (NORVASC) 5 MG tablet TAKE 1 TABLET BY MOUTH  DAILY  . atorvastatin (LIPITOR) 20 MG tablet TAKE 1 TABLET BY MOUTH  DAILY  . fluticasone (FLONASE) 50 MCG/ACT nasal spray USE 2 SPRAYS IN EACH  NOSTRIL DAILY  . ibuprofen (ADVIL,MOTRIN) 400 MG tablet Take 400 mg by mouth every 6 (six) hours as needed.  . Lutein 6 MG CAPS Take 6 mg by mouth daily.   . Magnesium 500 MG CAPS Take by mouth.  . metoprolol succinate (TOPROL-XL) 100 MG 24 hr tablet TAKE 1 TABLET BY MOUTH  DAILY WITH OR IMMEDIATELY  FOLLOWING A MEAL  . mirabegron ER (MYRBETRIQ) 25 MG TB24 tablet Take 1 tablet (25 mg total) by mouth daily.  . MULTIPLE VITAMIN PO Take 1 tablet by mouth daily.   . silver sulfADIAZINE (SILVADENE) 1 % cream Apply 1 application topically daily.  . valsartan-hydrochlorothiazide (DIOVAN-HCT) 160-25 MG tablet TAKE 1 TABLET BY MOUTH  DAILY   No facility-administered encounter medications on file as of 02/13/2020.    Activities of Daily Living In your present state of health, do you have any difficulty performing the following activities: 02/13/2020 03/27/2019  Hearing? N N  Comment no hearing aids -  Vision? N N  Comment eyeglasses, [facility] -  Difficulty concentrating or making decisions? N N  Walking or climbing stairs? Y N  Comment difficulty going up -    Dressing or bathing? N N  Doing errands, shopping? N N  Preparing Food and eating ? N -  Using the Toilet? N -  In the past six months, have you accidently leaked urine? Y -  Comment poise liners for protection -  Do you have problems with loss of bowel control? N -  Managing your Medications? N -  Managing your Finances? N -  Housekeeping or managing your Housekeeping? N -  Some recent data might be hidden    Patient Care Team: Dorcas Carrow, DO as PCP - General (Family Medicine)    Assessment:   This is a routine wellness examination for Anne Bell.  Exercise Activities and Dietary recommendations Current  Exercise Habits: The patient does not participate in regular exercise at present, Exercise limited by: None identified  Goals Addressed   None     Fall Risk: Fall Risk  02/13/2020 03/27/2019 02/22/2019 01/24/2018 01/09/2018  Falls in the past year? 0 0 0 Yes Yes  Comment - - - - chasing a dog   Number falls in past yr: 0 0 - 1 1  Injury with Fall? 0 0 - Yes Yes  Follow up - - - - Falls prevention discussed    FALL RISK PREVENTION PERTAINING TO THE HOME:  Any stairs in or around the home? Yes    Discussed possible resources for ramp. Declined at this time.   If so, are there any without handrails? No   Home free of loose throw rugs in walkways, pet beds, electrical cords, etc? Yes  Adequate lighting in your home to reduce risk of falls? Yes   ASSISTIVE DEVICES UTILIZED TO PREVENT FALLS:  Life alert? No  Use of a cane, walker or w/c? No  Grab bars in the bathroom? Yes  Shower chair or bench in shower? No  Elevated toilet seat or a handicapped toilet? Yes   DME ORDERS:  DME order needed?  No   TIMED UP AND GO:  Unable to perform    Depression Screen PHQ 2/9 Scores 02/13/2020 09/27/2019 03/27/2019 02/22/2019  PHQ - 2 Score 0 0 0 0  PHQ- 9 Score - 0 0 -     Cognitive Function : Not indicated      6CIT Screen 02/22/2019 01/09/2018 12/28/2016  What Year? 0 points  0 points 0 points  What month? 0 points 0 points 0 points  What time? 0 points 0 points 0 points  Count back from 20 0 points 0 points 0 points  Months in reverse 0 points 0 points 2 points  Repeat phrase 0 points 0 points 4 points  Total Score 0 0 6    Immunization History  Administered Date(s) Administered  . Influenza Whole 06/13/2017  . Influenza, High Dose Seasonal PF 06/20/2018  . Influenza-Unspecified 05/31/2015, 06/13/2017, 06/12/2019  . PFIZER SARS-COV-2 Vaccination 11/09/2019, 12/04/2019  . Pneumococcal Conjugate-13 05/31/2015  . Pneumococcal Polysaccharide-23 12/28/2016  . Tdap 02/24/2016  . Zoster 05/28/2014    Qualifies for Shingles Vaccine? Yes  Zostavax completed 2015. Due for Shingrix. Patient believes she had one at pharmacy, will check with them.  Education has been provided regarding the importance of this vaccine. Pt has been advised to call insurance company to determine out of pocket expense. Advised may also receive vaccine at local pharmacyr [facility]. Verbalized acceptance and understanding.  Tdap: up to date   Flu Vaccine: up to date   Pneumococcal Vaccine: up to date   Covid-19 Vaccine:  Completed vaccines  Screening Tests Health Maintenance  Topic Date Due  . INFLUENZA VACCINE  04/13/2020  . Fecal DNA (Cologuard)  08/15/2021  . MAMMOGRAM  11/18/2021  . TETANUS/TDAP  02/23/2026  . DEXA SCAN  Completed  . COVID-19 Vaccine  Completed  . Hepatitis C Screening  Completed  . PNA vac Low Risk Adult  Completed    Cancer Screenings:  Colorectal Screening: Completed cologuard 08/15/2018 Repeat every 3 years  Mammogram: Completed 11/19/2019. Repeat every year;   Bone Density: Completed 08/05/2015.   Lung Cancer Screening: (Low Dose CT Chest recommended if Age 74-80 years, 30 pack-year currently smoking OR have quit w/in 15years.) does not qualify.     Additional Screening:  Hepatitis  C Screening: does qualify; Completed 08/21/2015  Vision  Screening: Recommended annual ophthalmology exams for early detection of glaucoma and other disorders of the eye. Is the patient up to date with their annual eye exam?  Yes  Who is the provider or what is the name of the office in which the pt attends annual eye exams? [facility]    Dental Screening: Recommended annual dental exams for proper oral hygiene  Community Resource Referral:  CRR required this visit?  No       Plan:  I have personally reviewed and addressed the Medicare Annual Wellness questionnaire and have noted the following in the patient's chart:  A. Medical and social history B. Use of alcohol, tobacco or illicit drugs  C. Current medications and supplements D. Functional ability and status E.  Nutritional status F.  Physical activity G. Advance directives H. List of other physicians I.  Hospitalizations, surgeries, and ER visits in previous 12 months J.  Vitals K. Screenings such as hearing and vision if needed, cognitive and depression L. Referrals and appointments   In addition, I have reviewed and discussed with patient certain preventive protocols, quality metrics, and best practice recommendations. A written personalized care plan for preventive services as well as general preventive health recommendations were provided to patient.  Signed,    Collene Schlichter, LPN  03/18/2830 Nurse Health Advisor   Nurse Notes: none

## 2020-03-28 ENCOUNTER — Encounter: Payer: Medicare Other | Admitting: Family Medicine

## 2020-04-21 ENCOUNTER — Other Ambulatory Visit: Payer: Self-pay | Admitting: Family Medicine

## 2020-04-22 ENCOUNTER — Other Ambulatory Visit: Payer: Self-pay | Admitting: Family Medicine

## 2020-04-22 DIAGNOSIS — I129 Hypertensive chronic kidney disease with stage 1 through stage 4 chronic kidney disease, or unspecified chronic kidney disease: Secondary | ICD-10-CM

## 2020-04-22 DIAGNOSIS — E7800 Pure hypercholesterolemia, unspecified: Secondary | ICD-10-CM

## 2020-04-28 ENCOUNTER — Encounter: Payer: Self-pay | Admitting: Family Medicine

## 2020-04-28 ENCOUNTER — Other Ambulatory Visit: Payer: Self-pay

## 2020-04-28 ENCOUNTER — Ambulatory Visit (INDEPENDENT_AMBULATORY_CARE_PROVIDER_SITE_OTHER): Payer: Medicare Other | Admitting: Family Medicine

## 2020-04-28 VITALS — BP 155/82 | HR 60 | Temp 98.1°F | Wt 228.0 lb

## 2020-04-28 DIAGNOSIS — N390 Urinary tract infection, site not specified: Secondary | ICD-10-CM | POA: Diagnosis not present

## 2020-04-28 DIAGNOSIS — N898 Other specified noninflammatory disorders of vagina: Secondary | ICD-10-CM

## 2020-04-28 LAB — WET PREP FOR TRICH, YEAST, CLUE
Clue Cell Exam: NEGATIVE
Trichomonas Exam: NEGATIVE
Yeast Exam: NEGATIVE

## 2020-04-28 MED ORDER — NITROFURANTOIN MONOHYD MACRO 100 MG PO CAPS: 100.0000 mg | ORAL_CAPSULE | Freq: Two times a day (BID) | ORAL | 0 refills | Status: DC

## 2020-04-28 NOTE — Progress Notes (Signed)
BP (!) 155/82   Pulse 60   Temp 98.1 F (36.7 C) (Oral)   Wt 228 lb (103.4 kg)   LMP  (LMP Unknown)   SpO2 97%   BMI 43.08 kg/m    Subjective:    Patient ID: Anne Bell, female    DOB: Mar 22, 1951, 69 y.o.   MRN: [id number]  HPI: Anne Bell is a 69 y.o. female  Chief Complaint  Patient presents with  . Urinary Frequency  . Dysuria  . Vaginal Itching   Started about a week ago with urinary frequency, dysuria, vaginal itching and irritation. Took some AZO with mild temorary relief. Hx of recurrent UTIs. Denies fever, chills, flank pain, abdominal pain, N/V/D.   Relevant past medical, surgical, family and social history reviewed and updated as indicated. Interim medical history since our last visit reviewed. Allergies and medications reviewed and updated.  Review of Systems  Per HPI unless specifically indicated above     Objective:    BP (!) 155/82   Pulse 60   Temp 98.1 F (36.7 C) (Oral)   Wt 228 lb (103.4 kg)   LMP  (LMP Unknown)   SpO2 97%   BMI 43.08 kg/m   Wt Readings from Last 3 Encounters:  04/28/20 228 lb (103.4 kg)  02/13/20 221 lb (100.2 kg)  03/27/19 221 lb (100.2 kg)    Physical Exam Vitals and nursing note reviewed.  Constitutional:      Appearance: Normal appearance. She is not ill-appearing.  HENT:     Head: Atraumatic.  Eyes:     Extraocular Movements: Extraocular movements intact.     Conjunctiva/sclera: Conjunctivae normal.  Cardiovascular:     Rate and Rhythm: Normal rate and regular rhythm.     Heart sounds: Normal heart sounds.  Pulmonary:     Effort: Pulmonary effort is normal.     Breath sounds: Normal breath sounds.  Abdominal:     General: Bowel sounds are normal. There is no distension.     Palpations: Abdomen is soft.     Tenderness: There is no abdominal tenderness. There is no right CVA tenderness, left CVA tenderness or guarding.  Musculoskeletal:        General: Normal range of motion.     Cervical back:  Normal range of motion and neck supple.  Skin:    General: Skin is warm and dry.  Neurological:     Mental Status: She is alert and oriented to person, place, and time.  Psychiatric:        Mood and Affect: Mood normal.        Thought Content: Thought content normal.        Judgment: Judgment normal.     Results for orders placed or performed in visit on 10/02/19  Vit D  25 hydroxy (rtn osteoporosis monitoring)  Result Value Ref Range   Vit D, 25-Hydroxy 34.7 30.0 - 100.0 ng/mL  Microalbumin, Urine Waived  Result Value Ref Range   Microalb, Ur Waived 10 0 - 19 mg/L   Creatinine, Urine Waived 10 10 - 300 mg/dL   Microalb/Creat Ratio 30-300 (H) <30 mg/g  Lipid Panel w/o Chol/HDL Ratio OUT  Result Value Ref Range   Cholesterol, Total 149 100 - 199 mg/dL   Triglycerides 116 0 - 149 mg/dL   HDL 53 >39 mg/dL   VLDL Cholesterol Cal 21 5 - 40 mg/dL   LDL Chol Calc (NIH) 75 0 - 99 mg/dL  Comp Met (CMET)  Result Value Ref Range   Glucose 91 65 - 99 mg/dL   BUN 16 8 - 27 mg/dL   Creatinine, Ser 0.66 0.57 - 1.00 mg/dL   GFR calc non Af Amer 90 >59 mL/min/1.73   GFR calc Af Amer 104 >59 mL/min/1.73   BUN/Creatinine Ratio 24 12 - 28   Sodium 138 134 - 144 mmol/L   Potassium 4.1 3.5 - 5.2 mmol/L   Chloride 100 96 - 106 mmol/L   CO2 24 20 - 29 mmol/L   Calcium 10.4 (H) 8.7 - 10.3 mg/dL   Total Protein 6.7 6.0 - 8.5 g/dL   Albumin 4.2 3.8 - 4.8 g/dL   Globulin, Total 2.5 1.5 - 4.5 g/dL   Albumin/Globulin Ratio 1.7 1.2 - 2.2   Bilirubin Total 0.6 0.0 - 1.2 mg/dL   Alkaline Phosphatase 88 39 - 117 IU/L   AST 25 0 - 40 IU/L   ALT 23 0 - 32 IU/L      Assessment & Plan:   Problem List Items Addressed This Visit    None    Visit Diagnoses    Urinary tract infection without hematuria, site unspecified    -  Primary   Tx with macrobid, fluids, probiotics. Await urine culture, adjust if needed   Relevant Medications   nitrofurantoin, macrocrystal-monohydrate, (MACROBID) 100 MG  capsule   Other Relevant Orders   UA/M w/rflx Culture, Routine   Vaginal itching       Wet prep neg, probiotics recommended   Relevant Orders   WET PREP FOR TRICH, YEAST, CLUE       Follow up plan: Return if symptoms worsen or fail to improve.

## 2020-05-04 LAB — MICROSCOPIC EXAMINATION
Bacteria, UA: NONE SEEN
WBC, UA: >30 /hpf — AB (ref 0–5)

## 2020-05-04 LAB — UA/M W/RFLX CULTURE, ROUTINE
Bilirubin, UA: NEGATIVE
Glucose, UA: NEGATIVE
Ketones, UA: NEGATIVE
Nitrite, UA: NEGATIVE
Specific Gravity, UA: 1.015 (ref 1.005–1.030)
Urobilinogen, Ur: 0.2 mg/dL (ref 0.2–1.0)
pH, UA: 6 (ref 5.0–7.5)

## 2020-05-04 LAB — URINE CULTURE, REFLEX

## 2020-05-07 ENCOUNTER — Encounter: Payer: Self-pay | Admitting: Family Medicine

## 2020-05-07 ENCOUNTER — Ambulatory Visit (INDEPENDENT_AMBULATORY_CARE_PROVIDER_SITE_OTHER): Payer: Medicare Other | Admitting: Family Medicine

## 2020-05-07 ENCOUNTER — Other Ambulatory Visit: Payer: Self-pay

## 2020-05-07 VITALS — BP 134/84 | HR 64 | Temp 98.7°F | Ht 61.42 in | Wt 225.8 lb

## 2020-05-07 DIAGNOSIS — F325 Major depressive disorder, single episode, in full remission: Secondary | ICD-10-CM | POA: Diagnosis not present

## 2020-05-07 DIAGNOSIS — N3281 Overactive bladder: Secondary | ICD-10-CM

## 2020-05-07 DIAGNOSIS — E7800 Pure hypercholesterolemia, unspecified: Secondary | ICD-10-CM | POA: Diagnosis not present

## 2020-05-07 DIAGNOSIS — Z0000 Encounter for general adult medical examination without abnormal findings: Secondary | ICD-10-CM

## 2020-05-07 DIAGNOSIS — I129 Hypertensive chronic kidney disease with stage 1 through stage 4 chronic kidney disease, or unspecified chronic kidney disease: Secondary | ICD-10-CM

## 2020-05-07 DIAGNOSIS — D692 Other nonthrombocytopenic purpura: Secondary | ICD-10-CM

## 2020-05-07 DIAGNOSIS — Z1231 Encounter for screening mammogram for malignant neoplasm of breast: Secondary | ICD-10-CM

## 2020-05-07 DIAGNOSIS — Z13820 Encounter for screening for osteoporosis: Secondary | ICD-10-CM

## 2020-05-07 DIAGNOSIS — E559 Vitamin D deficiency, unspecified: Secondary | ICD-10-CM | POA: Diagnosis not present

## 2020-05-07 DIAGNOSIS — R21 Rash and other nonspecific skin eruption: Secondary | ICD-10-CM

## 2020-05-07 DIAGNOSIS — J301 Allergic rhinitis due to pollen: Secondary | ICD-10-CM

## 2020-05-07 MED ORDER — ATORVASTATIN CALCIUM 20 MG PO TABS: 20.0000 mg | ORAL_TABLET | Freq: Every day | ORAL | 1 refills | Status: DC

## 2020-05-07 MED ORDER — METOPROLOL SUCCINATE ER 100 MG PO TB24: ORAL_TABLET | ORAL | 1 refills | Status: DC

## 2020-05-07 MED ORDER — MIRABEGRON ER 25 MG PO TB24: 25.0000 mg | ORAL_TABLET | Freq: Every day | ORAL | 1 refills | Status: DC

## 2020-05-07 MED ORDER — VALSARTAN-HYDROCHLOROTHIAZIDE 160-25 MG PO TABS: 1.0000 | ORAL_TABLET | Freq: Every day | ORAL | 1 refills | Status: DC

## 2020-05-07 MED ORDER — AMLODIPINE BESYLATE 5 MG PO TABS: 5.0000 mg | ORAL_TABLET | Freq: Every day | ORAL | 1 refills | Status: DC

## 2020-05-07 MED ORDER — FLUTICASONE PROPIONATE 50 MCG/ACT NA SUSP: 2.0000 | Freq: Every day | NASAL | 3 refills | Status: DC

## 2020-05-07 NOTE — Assessment & Plan Note (Signed)
Under good control on current regimen. Continue current regimen. Continue to monitor. Call with any concerns. Refills given. Labs drawn today.   

## 2020-05-07 NOTE — Progress Notes (Signed)
BP 134/84 (BP Location: Left Arm, Patient Position: Sitting, Cuff Size: Large)   Pulse 64   Temp 98.7 F (37.1 C) (Oral)   Ht 5' 1.42" (1.56 m)   Wt 225 lb 12.8 oz (102.4 kg)   LMP  (LMP Unknown)   SpO2 96%   BMI 42.09 kg/m    Subjective:    Patient ID: Anne Bell, female    DOB: 11-Jan-1951, 69 y.o.   MRN: [id number]  HPI: Anne Bell is a 69 y.o. female presenting on 05/07/2020 for comprehensive medical examination. Current medical complaints include:  HYPERTENSION / HYPERLIPIDEMIA Satisfied with current treatment? yes Duration of hypertension: chronic BP monitoring frequency: not checking BP medication side effects: no Past BP meds: amlodipine, valsartan-HCTZ Duration of hyperlipidemia: chronic Cholesterol medication side effects: no Cholesterol supplements: none Past cholesterol medications: atorvastatin Medication compliance: excellent compliance Aspirin: no Recent stressors: no Recurrent headaches: no Visual changes: no Palpitations: no Dyspnea: no Chest pain: no Lower extremity edema: no Dizzy/lightheaded: no  DEPRESSION Mood status: controlled Satisfied with current treatment?: yes Symptom severity: mild  Psychotherapy/counseling: no  Depressed mood: no Anxious mood: no Anhedonia: no Significant weight loss or gain: no Insomnia: no  Fatigue: no Feelings of worthlessness or guilt: no Impaired concentration/indecisiveness: no Suicidal ideations: no Hopelessness: no Crying spells: no Depression screen [facility] 2/9 05/07/2020 02/13/2020 09/27/2019 03/27/2019 02/22/2019  Decreased Interest 0 0 0 0 0  Down, Depressed, Hopeless 0 0 0 0 0  PHQ - 2 Score 0 0 0 0 0  Altered sleeping 0 - 0 0 -  Tired, decreased energy 0 - 0 0 -  Change in appetite 0 - 0 0 -  Feeling bad or failure about yourself  0 - 0 0 -  Trouble concentrating 0 - 0 0 -  Moving slowly or fidgety/restless 0 - 0 0 -  Suicidal thoughts 0 - 0 0 -  PHQ-9 Score 0 - 0 0 -  Difficult doing  work/chores Not difficult at all - Not difficult at all Not difficult at all -   Menopausal Symptoms: no  Depression Screen done today and results listed below:  Depression screen [facility] 2/9 05/07/2020 02/13/2020 09/27/2019 03/27/2019 02/22/2019  Decreased Interest 0 0 0 0 0  Down, Depressed, Hopeless 0 0 0 0 0  PHQ - 2 Score 0 0 0 0 0  Altered sleeping 0 - 0 0 -  Tired, decreased energy 0 - 0 0 -  Change in appetite 0 - 0 0 -  Feeling bad or failure about yourself  0 - 0 0 -  Trouble concentrating 0 - 0 0 -  Moving slowly or fidgety/restless 0 - 0 0 -  Suicidal thoughts 0 - 0 0 -  PHQ-9 Score 0 - 0 0 -  Difficult doing work/chores Not difficult at all - Not difficult at all Not difficult at all -     Past Medical History:  Past Medical History:  Diagnosis Date  . Allergy   . Depression   . Hyperlipidemia   . Hypertension     Surgical History:  Past Surgical History:  Procedure Laterality Date  . BREAST CYST ASPIRATION  1970's   ? side  . PLANTAR FASCIA SURGERY Left 2008    Medications:  Current Outpatient Medications on File Prior to Visit  Medication Sig  . ibuprofen (ADVIL,MOTRIN) 400 MG tablet Take 400 mg by mouth every 6 (six) hours as needed.  . Lutein 6 MG CAPS Take 6 mg  by mouth daily.   . Magnesium 500 MG CAPS Take by mouth.  . MULTIPLE VITAMIN PO Take 1 tablet by mouth daily.   . silver sulfADIAZINE (SILVADENE) 1 % cream Apply 1 application topically daily.   No current facility-administered medications on file prior to visit.    Allergies:  Allergies  Allergen Reactions  . Celecoxib     stomach cramps  . Prednisone Other (See Comments)  . Sulfa Antibiotics     Unknown allergy    Social History:  Social History   Socioeconomic History  . Marital status: Divorced    Spouse name: Not on file  . Number of children: Not on file  . Years of education: Not on file  . Highest education level: Some college, no degree  Occupational History  . Occupation:  Retired  Tobacco Use  . Smoking status: Never Smoker  . Smokeless tobacco: Never Used  Vaping Use  . Vaping Use: Never used  Substance and Sexual Activity  . Alcohol use: Yes    Comment: occasional  . Drug use: No  . Sexual activity: Yes    Birth control/protection: None  Other Topics Concern  . Not on file  Social History Narrative  . Not on file   Social Determinants of Health   Financial Resource Strain: Low Risk   . Difficulty of Paying Living Expenses: Not hard at all  Food Insecurity: No Food Insecurity  . Worried About Programme researcher, broadcasting/film/videounning Out of Food in the Last Year: Never true  . Ran Out of Food in the Last Year: Never true  Transportation Needs: No Transportation Needs  . Lack of Transportation (Medical): No  . Lack of Transportation (Non-Medical): No  Physical Activity: Inactive  . Days of Exercise per Week: 0 days  . Minutes of Exercise per Session: 0 min  Stress:   . Feeling of Stress : Not on file  Social Connections: Socially Isolated  . Frequency of Communication with Friends and Family: More than three times a week  . Frequency of Social Gatherings with Friends and Family: More than three times a week  . Attends Religious Services: Never  . Active Member of Clubs or Organizations: No  . Attends BankerClub or Organization Meetings: Never  . Marital Status: Divorced  Catering managerntimate Partner Violence:   . Fear of Current or Ex-Partner: Not on file  . Emotionally Abused: Not on file  . Physically Abused: Not on file  . Sexually Abused: Not on file   Social History   Tobacco Use  Smoking Status Never Smoker  Smokeless Tobacco Never Used   Social History   Substance and Sexual Activity  Alcohol Use Yes   Comment: occasional    Family History:  Family History  Problem Relation Age of Onset  . Alcohol abuse Father   . Mental illness Mother        Depression  . Cancer Mother        started in back   . Brain cancer Sister   . Breast cancer Neg Hx     Past medical  history, surgical history, medications, allergies, family history and social history reviewed with patient today and changes made to appropriate areas of the chart.   Review of Systems  Constitutional: Negative.   HENT: Negative.   Eyes: Negative.   Respiratory: Positive for cough. Negative for hemoptysis, sputum production, shortness of breath and wheezing.   Cardiovascular: Negative.   Gastrointestinal: Negative.   Genitourinary: Negative.   Musculoskeletal: Negative.  Skin: Negative.   Neurological: Negative.   Endo/Heme/Allergies: Negative for environmental allergies and polydipsia. Bruises/bleeds easily.  Psychiatric/Behavioral: Negative.     All other ROS negative except what is listed above and in the HPI.      Objective:    BP 134/84 (BP Location: Left Arm, Patient Position: Sitting, Cuff Size: Large)   Pulse 64   Temp 98.7 F (37.1 C) (Oral)   Ht 5' 1.42" (1.56 m)   Wt 225 lb 12.8 oz (102.4 kg)   LMP  (LMP Unknown)   SpO2 96%   BMI 42.09 kg/m   Wt Readings from Last 3 Encounters:  05/07/20 225 lb 12.8 oz (102.4 kg)  04/28/20 228 lb (103.4 kg)  02/13/20 221 lb (100.2 kg)    Physical Exam Vitals and nursing note reviewed.  Constitutional:      General: She is not in acute distress.    Appearance: Normal appearance. She is not ill-appearing, toxic-appearing or diaphoretic.  HENT:     Head: Normocephalic and atraumatic.     Right Ear: Tympanic membrane, ear canal and external ear normal. There is no impacted cerumen.     Left Ear: Tympanic membrane, ear canal and external ear normal. There is no impacted cerumen.     Nose: Nose normal. No congestion or rhinorrhea.     Mouth/Throat:     Mouth: Mucous membranes are moist.     Pharynx: Oropharynx is clear. No oropharyngeal exudate or posterior oropharyngeal erythema.  Eyes:     General: No scleral icterus.       Right eye: No discharge.        Left eye: No discharge.     Extraocular Movements: Extraocular  movements intact.     Conjunctiva/sclera: Conjunctivae normal.     Pupils: Pupils are equal, round, and reactive to light.  Neck:     Vascular: No carotid bruit.  Cardiovascular:     Rate and Rhythm: Normal rate and regular rhythm.     Pulses: Normal pulses.     Heart sounds: No murmur heard.  No friction rub. No gallop.   Pulmonary:     Effort: Pulmonary effort is normal. No respiratory distress.     Breath sounds: Normal breath sounds. No stridor. No wheezing, rhonchi or rales.  Chest:     Chest wall: No tenderness.  Abdominal:     General: Abdomen is flat. Bowel sounds are normal. There is no distension.     Palpations: Abdomen is soft. There is no mass.     Tenderness: There is no abdominal tenderness. There is no right CVA tenderness, left CVA tenderness, guarding or rebound.     Hernia: No hernia is present.  Genitourinary:    Comments: Breast and pelvic exams deferred with shared decision making Musculoskeletal:        General: No swelling, tenderness, deformity or signs of injury.     Cervical back: Normal range of motion and neck supple. No rigidity. No muscular tenderness.     Right lower leg: No edema.     Left lower leg: No edema.  Lymphadenopathy:     Cervical: No cervical adenopathy.  Skin:    General: Skin is warm and dry.     Capillary Refill: Capillary refill takes less than 2 seconds.     Coloration: Skin is not jaundiced or pale.     Findings: No bruising, erythema, lesion or rash.     Comments: Excoriated lesions on L lower leg  Neurological:  General: No focal deficit present.     Mental Status: She is alert and oriented to person, place, and time. Mental status is at baseline.     Cranial Nerves: No cranial nerve deficit.     Sensory: No sensory deficit.     Motor: No weakness.     Coordination: Coordination normal.     Gait: Gait normal.     Deep Tendon Reflexes: Reflexes normal.  Psychiatric:        Mood and Affect: Mood normal.         Behavior: Behavior normal.        Thought Content: Thought content normal.        Judgment: Judgment normal.     Results for orders placed or performed in visit on 04/28/20  WET PREP FOR TRICH, YEAST, CLUE   Specimen: Vaginal; Sterile Swab   Sterile Swab  Result Value Ref Range   Trichomonas Exam Negative Negative   Yeast Exam Negative Negative   Clue Cell Exam Negative Negative  Microscopic Examination   Urine  Result Value Ref Range   WBC, UA >30 (A) 0 - 5 /hpf   RBC 3-10 (A) 0 - 2 /hpf   Epithelial Cells (non renal) 0-10 0 - 10 /hpf   Bacteria, UA None seen None seen/Few  Urine Culture, Reflex   Urine  Result Value Ref Range   Urine Culture, Routine Final report (A)    Organism ID, Bacteria Escherichia coli (A)    Antimicrobial Susceptibility Comment   UA/M w/rflx Culture, Routine   Specimen: Urine   Urine  Result Value Ref Range   Specific Gravity, UA 1.015 1.005 - 1.030   pH, UA 6.0 5.0 - 7.5   Color, UA Yellow Yellow   Appearance Ur Cloudy (A) Clear   Leukocytes,UA 3+ (A) Negative   Protein,UA 1+ (A) Negative/Trace   Glucose, UA Negative Negative   Ketones, UA Negative Negative   RBC, UA Trace (A) Negative   Bilirubin, UA Negative Negative   Urobilinogen, Ur 0.2 0.2 - 1.0 mg/dL   Nitrite, UA Negative Negative   Microscopic Examination See below:    Urinalysis Reflex Comment       Assessment & Plan:   Problem List Items Addressed This Visit      Respiratory   Allergic rhinitis   Relevant Medications   fluticasone (FLONASE) 50 MCG/ACT nasal spray     Genitourinary   Benign hypertensive renal disease    Under good control on current regimen. Continue current regimen. Continue to monitor. Call with any concerns. Refills given. Labs drawn today.      Relevant Medications   valsartan-hydrochlorothiazide (DIOVAN-HCT) 160-25 MG tablet   metoprolol succinate (TOPROL-XL) 100 MG 24 hr tablet   amLODipine (NORVASC) 5 MG tablet   Other Relevant Orders   CBC  with Differential/Platelet   Comprehensive metabolic panel   Microalbumin, Urine Waived   Urinalysis, Routine w reflex microscopic   Overactive bladder    Under good control on current regimen. Continue current regimen. Continue to monitor. Call with any concerns. Refills given. Labs drawn today.         Other   Major depression in remission (HCC)    Stable off meds. Continue to monitor. Call with any concerns.       Relevant Orders   CBC with Differential/Platelet   Comprehensive metabolic panel   TSH   Hypercholesteremia    Under good control on current regimen. Continue current regimen. Continue to  monitor. Call with any concerns. Refills given. Labs drawn today.      Relevant Medications   valsartan-hydrochlorothiazide (DIOVAN-HCT) 160-25 MG tablet   metoprolol succinate (TOPROL-XL) 100 MG 24 hr tablet   atorvastatin (LIPITOR) 20 MG tablet   amLODipine (NORVASC) 5 MG tablet   Other Relevant Orders   CBC with Differential/Platelet   Comprehensive metabolic panel   Lipid Panel w/o Chol/HDL Ratio   VITAMIN D 25 Hydroxy (Vit-D Deficiency, Fractures)   Avitaminosis D    Rechecking levels today. Await results. Treat as needed.       Relevant Orders   CBC with Differential/Platelet   Comprehensive metabolic panel    Other Visit Diagnoses    Routine general medical examination at a health care facility    -  Primary   Vaccines up to date. Screening labs checked today. Mammo and DEXA in the spring. Continue diet and exercise. Cologuard 2022. Call with any concerns.    Rash       Woud like to see derm. Referral generated today.   Relevant Orders   Ambulatory referral to Dermatology   Senile purpura [facility])       Reassured patient. Call with any concerns.    Relevant Medications   valsartan-hydrochlorothiazide (DIOVAN-HCT) 160-25 MG tablet   metoprolol succinate (TOPROL-XL) 100 MG 24 hr tablet   atorvastatin (LIPITOR) 20 MG tablet   amLODipine (NORVASC) 5 MG tablet    Screening for osteoporosis       DEXA ordered today.   Relevant Orders   DG Bone Density   Encounter for screening mammogram for malignant neoplasm of breast       Mammogram ordered today.   Relevant Orders   MM 3D SCREEN BREAST BILATERAL       Follow up plan: No follow-ups on file.   LABORATORY TESTING:  - Pap smear: not applicable  IMMUNIZATIONS:   - Tdap: Tetanus vaccination status reviewed: last tetanus booster within 10 years. - Influenza: Postponed to flu season - Pneumovax: Up to date - Prevnar: Up to date - COVID: Up to date  SCREENING: -Mammogram: Ordered today  - Colonoscopy: Up to date  - Bone Density: Ordered today   PATIENT COUNSELING:   Advised to take 1 mg of folate supplement per day if capable of pregnancy.   Sexuality: Discussed sexually transmitted diseases, partner selection, use of condoms, avoidance of unintended pregnancy  and contraceptive alternatives.   Advised to avoid cigarette smoking.  I discussed with the patient that most people either abstain from alcohol or drink within safe limits (<=14/week and <=4 drinks/occasion for males, <=7/weeks and <= 3 drinks/occasion for females) and that the risk for alcohol disorders and other health effects rises proportionally with the number of drinks per week and how often a drinker exceeds daily limits.  Discussed cessation/primary prevention of drug use and availability of treatment for abuse.   Diet: Encouraged to adjust caloric intake to maintain  or achieve ideal body weight, to reduce intake of dietary saturated fat and total fat, to limit sodium intake by avoiding high sodium foods and not adding table salt, and to maintain adequate dietary potassium and calcium preferably from fresh fruits, vegetables, and low-fat dairy products.    stressed the importance of regular exercise  Injury prevention: Discussed safety belts, safety helmets, smoke detector, smoking near bedding or upholstery.   Dental  health: Discussed importance of regular tooth brushing, flossing, and dental visits.    NEXT PREVENTATIVE PHYSICAL DUE IN 1 YEAR.  No follow-ups on file.

## 2020-05-07 NOTE — Assessment & Plan Note (Signed)
Rechecking levels today. Await results. Treat as needed.  

## 2020-05-07 NOTE — Patient Instructions (Signed)
Health Maintenance After Age 70 After age 70, you are at a higher risk for certain long-term diseases and infections as well as injuries from falls. Falls are a major cause of broken bones and head injuries in people who are older than age 70. Getting regular preventive care can help to keep you healthy and well. Preventive care includes getting regular testing and making lifestyle changes as recommended by your health care provider. Talk with your health care provider about:  Which screenings and tests you should have. A screening is a test that checks for a disease when you have no symptoms.  A diet and exercise plan that is right for you. What should I know about screenings and tests to prevent falls? Screening and testing are the best ways to find a health problem early. Early diagnosis and treatment give you the best chance of managing medical conditions that are common after age 70. Certain conditions and lifestyle choices may make you more likely to have a fall. Your health care provider may recommend:  Regular vision checks. Poor vision and conditions such as cataracts can make you more likely to have a fall. If you wear glasses, make sure to get your prescription updated if your vision changes.  Medicine review. Work with your health care provider to regularly review all of the medicines you are taking, including over-the-counter medicines. Ask your health care provider about any side effects that may make you more likely to have a fall. Tell your health care provider if any medicines that you take make you feel dizzy or sleepy.  Osteoporosis screening. Osteoporosis is a condition that causes the bones to get weaker. This can make the bones weak and cause them to break more easily.  Blood pressure screening. Blood pressure changes and medicines to control blood pressure can make you feel dizzy.  Strength and balance checks. Your health care provider may recommend certain tests to check your  strength and balance while standing, walking, or changing positions.  Foot health exam. Foot pain and numbness, as well as not wearing proper footwear, can make you more likely to have a fall.  Depression screening. You may be more likely to have a fall if you have a fear of falling, feel emotionally low, or feel unable to do activities that you used to do.  Alcohol use screening. Using too much alcohol can affect your balance and may make you more likely to have a fall. What actions can I take to lower my risk of falls? General instructions  Talk with your health care provider about your risks for falling. Tell your health care provider if: ? You fall. Be sure to tell your health care provider about all falls, even ones that seem minor. ? You feel dizzy, sleepy, or off-balance.  Take over-the-counter and prescription medicines only as told by your health care provider. These include any supplements.  Eat a healthy diet and maintain a healthy weight. A healthy diet includes low-fat dairy products, low-fat (lean) meats, and fiber from whole grains, beans, and lots of fruits and vegetables. Home safety  Remove any tripping hazards, such as rugs, cords, and clutter.  Install safety equipment such as grab bars in bathrooms and safety rails on stairs.  Keep rooms and walkways well-lit. Activity   Follow a regular exercise program to stay fit. This will help you maintain your balance. Ask your health care provider what types of exercise are appropriate for you.  If you need a cane or   walker, use it as recommended by your health care provider.  Wear supportive shoes that have nonskid soles. Lifestyle  Do not drink alcohol if your health care provider tells you not to drink.  If you drink alcohol, limit how much you have: ? 0-1 drink a day for women. ? 0-2 drinks a day for men.  Be aware of how much alcohol is in your drink. In the [place] one drink equals one typical bottle of beer (12  oz), one-half glass of wine (5 oz), or one shot of hard liquor (1 oz).  Do not use any products that contain nicotine or tobacco, such as cigarettes and e-cigarettes. If you need help quitting, ask your health care provider. Summary  Having a healthy lifestyle and getting preventive care can help to protect your health and wellness after age 70.  Screening and testing are the best way to find a health problem early and help you avoid having a fall. Early diagnosis and treatment give you the best chance for managing medical conditions that are more common for people who are older than age 70.  Falls are a major cause of broken bones and head injuries in people who are older than age 70. Take precautions to prevent a fall at home.  Work with your health care provider to learn what changes you can make to improve your health and wellness and to prevent falls. This information is not intended to replace advice given to you by your health care provider. Make sure you discuss any questions you have with your health care provider. Document Revised: 12/21/2018 Document Reviewed: 07/13/2017 Elsevier Patient Education  2020 [employer]  

## 2020-05-07 NOTE — Assessment & Plan Note (Signed)
Stable off meds. Continue to monitor. Call with any concerns.

## 2020-05-08 ENCOUNTER — Other Ambulatory Visit: Payer: Self-pay

## 2020-05-08 ENCOUNTER — Ambulatory Visit: Payer: Medicare Other | Admitting: Dermatology

## 2020-05-08 DIAGNOSIS — Q828 Other specified congenital malformations of skin: Secondary | ICD-10-CM

## 2020-05-08 DIAGNOSIS — W57XXXA Bitten or stung by nonvenomous insect and other nonvenomous arthropods, initial encounter: Secondary | ICD-10-CM | POA: Diagnosis not present

## 2020-05-08 DIAGNOSIS — S80862A Insect bite (nonvenomous), left lower leg, initial encounter: Secondary | ICD-10-CM

## 2020-05-08 DIAGNOSIS — S80861A Insect bite (nonvenomous), right lower leg, initial encounter: Secondary | ICD-10-CM | POA: Diagnosis not present

## 2020-05-08 LAB — COMPREHENSIVE METABOLIC PANEL WITH GFR
ALT: 19 IU/L (ref 0–32)
AST: 21 IU/L (ref 0–40)
Albumin/Globulin Ratio: 1.6 (ref 1.2–2.2)
Albumin: 4.4 g/dL (ref 3.8–4.8)
Alkaline Phosphatase: 93 IU/L (ref 48–121)
BUN/Creatinine Ratio: 28 (ref 12–28)
BUN: 23 mg/dL (ref 8–27)
Bilirubin Total: 0.6 mg/dL (ref 0.0–1.2)
CO2: 25 mmol/L (ref 20–29)
Calcium: 10.7 mg/dL — ABNORMAL HIGH (ref 8.7–10.3)
Chloride: 102 mmol/L (ref 96–106)
Creatinine, Ser: 0.82 mg/dL (ref 0.57–1.00)
GFR calc Af Amer: 84 mL/min/1.73
GFR calc non Af Amer: 73 mL/min/1.73
Globulin, Total: 2.7 g/dL (ref 1.5–4.5)
Glucose: 96 mg/dL (ref 65–99)
Potassium: 4.2 mmol/L (ref 3.5–5.2)
Sodium: 141 mmol/L (ref 134–144)
Total Protein: 7.1 g/dL (ref 6.0–8.5)

## 2020-05-08 LAB — CBC WITH DIFFERENTIAL/PLATELET
Basophils Absolute: 0.1 x10E3/uL (ref 0.0–0.2)
Basos: 1 %
EOS (ABSOLUTE): 0.3 x10E3/uL (ref 0.0–0.4)
Eos: 4 %
Hematocrit: 42.3 % (ref 34.0–46.6)
Hemoglobin: 14 g/dL (ref 11.1–15.9)
Immature Grans (Abs): 0 x10E3/uL (ref 0.0–0.1)
Immature Granulocytes: 0 %
Lymphocytes Absolute: 1.6 x10E3/uL (ref 0.7–3.1)
Lymphs: 19 %
MCH: 31.4 pg (ref 26.6–33.0)
MCHC: 33.1 g/dL (ref 31.5–35.7)
MCV: 95 fL (ref 79–97)
Monocytes Absolute: 0.6 x10E3/uL (ref 0.1–0.9)
Monocytes: 7 %
Neutrophils Absolute: 5.8 x10E3/uL (ref 1.4–7.0)
Neutrophils: 69 %
Platelets: 196 x10E3/uL (ref 150–450)
RBC: 4.46 x10E6/uL (ref 3.77–5.28)
RDW: 12.9 % (ref 11.7–15.4)
WBC: 8.3 x10E3/uL (ref 3.4–10.8)

## 2020-05-08 LAB — LIPID PANEL W/O CHOL/HDL RATIO
Cholesterol, Total: 143 mg/dL (ref 100–199)
HDL: 47 mg/dL
LDL Chol Calc (NIH): 75 mg/dL (ref 0–99)
Triglycerides: 118 mg/dL (ref 0–149)
VLDL Cholesterol Cal: 21 mg/dL (ref 5–40)

## 2020-05-08 LAB — TSH: TSH: 0.737 u[IU]/mL (ref 0.450–4.500)

## 2020-05-08 LAB — VITAMIN D 25 HYDROXY (VIT D DEFICIENCY, FRACTURES): Vit D, 25-Hydroxy: 29.8 ng/mL — ABNORMAL LOW (ref 30.0–100.0)

## 2020-05-08 MED ORDER — CLOBETASOL PROPIONATE 0.05 % EX OINT: TOPICAL_OINTMENT | CUTANEOUS | 1 refills | Status: DC

## 2020-05-08 NOTE — Patient Instructions (Addendum)
Topical steroids (such as triamcinolone, fluocinolone, fluocinonide, mometasone, clobetasol, halobetasol, betamethasone, hydrocortisone) can cause thinning and lightening of the skin if they are used for too long in the same area. Your physician has selected the right strength medicine for your problem and area affected on the body. Please use your medication only as directed by your physician to prevent side effects.   Recommend Gold Bond Rapid Relief Anti-Itch cream up to 3 times per day to areas that are itchy.  Recommend Picaridin bug repellent when outdoors.

## 2020-05-08 NOTE — Progress Notes (Signed)
   New Patient Visit  Subjective  Anne Bell is a 69 y.o. female who presents for the following: Rash.  Patient here today for rash at lower legs and feet. Present for 2-3 years. They will itch when they first come up and are red but will eventually fade and scar. Patient has used neosporin, alcohol, witch hazel and Silver Sulfadiazine.   Patient does not have any animals but her best friend has a dog and neighbors on either side of her have cats. She had a UTI about 2 weeks ago, medications have stayed the same for years. No other spots anywhere else. Worse in summer and spring.   The following portions of the chart were reviewed this encounter and updated as appropriate:  Tobacco  Allergies  Meds  Problems  Med Hx  Surg Hx  Fam Hx      Review of Systems:  No other skin or systemic complaints except as noted in HPI or Assessment and Plan.  Objective  Well appearing patient in no apparent distress; mood and affect are within normal limits.  A focused examination was performed including face, bilateral arms, hands, fingernails, bilateral legs, feet. Relevant physical exam findings are noted in the Assessment and Plan.  Objective  Lower Legs: Many edematous pink papules with central more firm focus scattered at lower legs and feet   Assessment & Plan  Porokeratosis Left Lower Leg  Benign-appearing.  Observation.  Call clinic for new or changing lesions.  Recommend daily use of broad spectrum spf 30+ sunscreen to sun-exposed areas.  Insect bite of lower leg with local reaction, unspecified laterality, initial encounter Lower Legs  Start clobetasol 0.05% ointment twice daily to affected areas until clear. Avoid applying to face, groin, and axilla. Use as directed. Risk of skin atrophy with long-term use reviewed.   Topical steroids (such as triamcinolone, fluocinolone, fluocinonide, mometasone, clobetasol, halobetasol, betamethasone, hydrocortisone) can cause thinning and  lightening of the skin if they are used for too long in the same area. Your physician has selected the right strength medicine for your problem and area affected on the body. Please use your medication only as directed by your physician to prevent side effects.   Patient advised dark spots left behind can take months to even a year to fade.   Ordered Medications: clobetasol ointment (TEMOVATE) 0.05 %  Return in about 2 months (around 07/08/2020) for recheck lower legs.  Anise Salvo, RMA, am acting as scribe for Darden Dates, MD .  Documentation: I have reviewed the above documentation for accuracy and completeness, and I agree with the above.  Darden Dates, MD

## 2020-05-12 ENCOUNTER — Telehealth: Payer: Self-pay

## 2020-05-12 ENCOUNTER — Encounter: Payer: Self-pay | Admitting: Family Medicine

## 2020-05-12 ENCOUNTER — Other Ambulatory Visit: Payer: Self-pay

## 2020-05-12 DIAGNOSIS — W57XXXA Bitten or stung by nonvenomous insect and other nonvenomous arthropods, initial encounter: Secondary | ICD-10-CM

## 2020-05-12 NOTE — Telephone Encounter (Signed)
Returned voicemail from patient inquiring about her prescription from 05/06/20. Prescription was sent to [facility] on [address] in [place].

## 2020-05-13 ENCOUNTER — Other Ambulatory Visit: Payer: Self-pay

## 2020-05-13 ENCOUNTER — Encounter: Payer: Self-pay | Admitting: Dermatology

## 2020-05-13 DIAGNOSIS — W57XXXA Bitten or stung by nonvenomous insect and other nonvenomous arthropods, initial encounter: Secondary | ICD-10-CM

## 2020-05-13 MED ORDER — CLOBETASOL PROPIONATE 0.05 % EX OINT: TOPICAL_OINTMENT | CUTANEOUS | 1 refills | Status: DC

## 2020-05-13 NOTE — Progress Notes (Signed)
Prescription was printed instead of sent to [facility]. Resent today.

## 2020-06-11 ENCOUNTER — Ambulatory Visit: Payer: Medicare Other | Admitting: Podiatry

## 2020-06-25 ENCOUNTER — Ambulatory Visit (INDEPENDENT_AMBULATORY_CARE_PROVIDER_SITE_OTHER): Payer: Medicare Other | Admitting: Podiatry

## 2020-06-25 ENCOUNTER — Encounter: Payer: Self-pay | Admitting: Podiatry

## 2020-06-25 ENCOUNTER — Other Ambulatory Visit: Payer: Self-pay | Admitting: Podiatry

## 2020-06-25 ENCOUNTER — Other Ambulatory Visit: Payer: Self-pay

## 2020-06-25 ENCOUNTER — Ambulatory Visit (INDEPENDENT_AMBULATORY_CARE_PROVIDER_SITE_OTHER): Payer: Medicare Other

## 2020-06-25 DIAGNOSIS — M7671 Peroneal tendinitis, right leg: Secondary | ICD-10-CM

## 2020-06-25 DIAGNOSIS — M7751 Other enthesopathy of right foot: Secondary | ICD-10-CM

## 2020-06-25 NOTE — Progress Notes (Signed)
She presents today chief complaint of pain to the right foot I am she states is been aching and popping for quite some time now and is hard to walk on it at times.  She denies any trauma to the foot states that they both seem to swell with the right seems to swell worse.  Objective: Vital signs are stable alert oriented x3 there is no erythema cellulitis drainage or odor though she does have moderate edema pitting in nature.  He has strong palpable pulses.  No open lesions or wounds.  She has pain on palpation across the midfoot she also has pain on palpation and end range of motion of the sinus tarsi and subtalar joint.  Radiographs of the right foot demonstrate an osseously mature individual with significant osteoarthritis across Lisfranc's joints of the midfoot.  She also has some osteoarthritis of the subtalar joint.  And pes planus.  Assessment: Pes planus osteoarthritis midfoot subtalar joint right with edema.  Plan: I injected the subtalar joint today 20 mg Kenalog 5 mg Marcaine point of maximal tenderness.  Recommended that she get back into her tennis shoes with her orthotics and I will follow-up with her on an as-needed basis.

## 2020-07-08 ENCOUNTER — Ambulatory Visit: Payer: Medicare Other | Admitting: Dermatology

## 2020-07-28 ENCOUNTER — Telehealth: Payer: Self-pay

## 2020-07-28 DIAGNOSIS — R42 Dizziness and giddiness: Secondary | ICD-10-CM

## 2020-07-28 NOTE — Telephone Encounter (Signed)
Pt called back in to follow up on referral request, advised pt of current status.

## 2020-07-28 NOTE — Telephone Encounter (Signed)
Copied from CRM [id number]. Topic: Referral - Request for Referral >> Jul 28, 2020 10:36 AM Adrian Prince D wrote: Has patient seen PCP for this complaint? Yes.   *If NO, is insurance requiring patient see PCP for this issue before PCP can refer them? Referral for which specialty:PT Preferred provider/office: [facility] Reason for referral:Dr. Okey Dupre [phone] or Dr. Delaney Meigs [phone] (The doctor's that her insurance covers)The Therapy number is [phone]

## 2020-08-19 DIAGNOSIS — M5416 Radiculopathy, lumbar region: Secondary | ICD-10-CM | POA: Insufficient documentation

## 2020-08-22 DIAGNOSIS — M5416 Radiculopathy, lumbar region: Secondary | ICD-10-CM | POA: Diagnosis not present

## 2020-08-29 DIAGNOSIS — M4316 Spondylolisthesis, lumbar region: Secondary | ICD-10-CM | POA: Diagnosis not present

## 2020-09-10 DIAGNOSIS — M5416 Radiculopathy, lumbar region: Secondary | ICD-10-CM | POA: Diagnosis not present

## 2020-09-17 DIAGNOSIS — M4317 Spondylolisthesis, lumbosacral region: Secondary | ICD-10-CM | POA: Diagnosis not present

## 2020-09-17 DIAGNOSIS — M9903 Segmental and somatic dysfunction of lumbar region: Secondary | ICD-10-CM | POA: Diagnosis not present

## 2020-09-18 DIAGNOSIS — M4317 Spondylolisthesis, lumbosacral region: Secondary | ICD-10-CM | POA: Diagnosis not present

## 2020-09-18 DIAGNOSIS — M9903 Segmental and somatic dysfunction of lumbar region: Secondary | ICD-10-CM | POA: Diagnosis not present

## 2020-09-23 DIAGNOSIS — M4317 Spondylolisthesis, lumbosacral region: Secondary | ICD-10-CM | POA: Diagnosis not present

## 2020-09-23 DIAGNOSIS — M9903 Segmental and somatic dysfunction of lumbar region: Secondary | ICD-10-CM | POA: Diagnosis not present

## 2020-09-25 DIAGNOSIS — M4317 Spondylolisthesis, lumbosacral region: Secondary | ICD-10-CM | POA: Diagnosis not present

## 2020-09-25 DIAGNOSIS — M9903 Segmental and somatic dysfunction of lumbar region: Secondary | ICD-10-CM | POA: Diagnosis not present

## 2020-10-02 DIAGNOSIS — M9903 Segmental and somatic dysfunction of lumbar region: Secondary | ICD-10-CM | POA: Diagnosis not present

## 2020-10-02 DIAGNOSIS — M4317 Spondylolisthesis, lumbosacral region: Secondary | ICD-10-CM | POA: Diagnosis not present

## 2020-10-06 ENCOUNTER — Other Ambulatory Visit: Payer: Self-pay | Admitting: Family Medicine

## 2020-10-06 DIAGNOSIS — I129 Hypertensive chronic kidney disease with stage 1 through stage 4 chronic kidney disease, or unspecified chronic kidney disease: Secondary | ICD-10-CM

## 2020-10-06 DIAGNOSIS — E7800 Pure hypercholesterolemia, unspecified: Secondary | ICD-10-CM

## 2020-11-07 ENCOUNTER — Other Ambulatory Visit: Payer: Self-pay

## 2020-11-07 ENCOUNTER — Encounter: Payer: Self-pay | Admitting: Family Medicine

## 2020-11-07 ENCOUNTER — Telehealth: Payer: Self-pay

## 2020-11-07 ENCOUNTER — Ambulatory Visit (INDEPENDENT_AMBULATORY_CARE_PROVIDER_SITE_OTHER): Payer: Medicare Other | Admitting: Family Medicine

## 2020-11-07 VITALS — BP 131/80 | HR 57 | Temp 97.8°F | Wt 222.6 lb

## 2020-11-07 DIAGNOSIS — N3281 Overactive bladder: Secondary | ICD-10-CM | POA: Diagnosis not present

## 2020-11-07 DIAGNOSIS — E7800 Pure hypercholesterolemia, unspecified: Secondary | ICD-10-CM

## 2020-11-07 DIAGNOSIS — F325 Major depressive disorder, single episode, in full remission: Secondary | ICD-10-CM | POA: Diagnosis not present

## 2020-11-07 DIAGNOSIS — I129 Hypertensive chronic kidney disease with stage 1 through stage 4 chronic kidney disease, or unspecified chronic kidney disease: Secondary | ICD-10-CM | POA: Diagnosis not present

## 2020-11-07 DIAGNOSIS — E559 Vitamin D deficiency, unspecified: Secondary | ICD-10-CM | POA: Diagnosis not present

## 2020-11-07 MED ORDER — VALSARTAN-HYDROCHLOROTHIAZIDE 160-25 MG PO TABS: 1.0000 | ORAL_TABLET | Freq: Every day | ORAL | 0 refills | Status: DC

## 2020-11-07 MED ORDER — AMLODIPINE BESYLATE 5 MG PO TABS
5.0000 mg | ORAL_TABLET | Freq: Every day | ORAL | 0 refills | Status: DC
Start: 2020-11-07 — End: 2021-03-04

## 2020-11-07 MED ORDER — MIRABEGRON ER 50 MG PO TB24: 50.0000 mg | ORAL_TABLET | Freq: Every day | ORAL | 1 refills | Status: DC

## 2020-11-07 MED ORDER — METOPROLOL SUCCINATE ER 100 MG PO TB24: ORAL_TABLET | ORAL | 0 refills | Status: DC

## 2020-11-07 MED ORDER — ATORVASTATIN CALCIUM 20 MG PO TABS: 20.0000 mg | ORAL_TABLET | Freq: Every day | ORAL | 0 refills | Status: DC

## 2020-11-07 NOTE — Assessment & Plan Note (Signed)
Doing well off medicine. Continue to monitor. Call with any concerns.  

## 2020-11-07 NOTE — Progress Notes (Signed)
BP 131/80   Pulse (!) 57   Temp 97.8 F (36.6 C)   Wt 222 lb 9.6 oz (101 kg)   LMP  (LMP Unknown)   SpO2 99%   BMI 41.49 kg/m    Subjective:    Patient ID: Anne Bell, female    DOB: 06-09-1951, 70 y.o.   MRN: [id number]  HPI: Anne Bell is a 70 y.o. female  Chief Complaint  Patient presents with  . Hyperlipidemia  . Depression  . benign hypertensive renal disease  . overactive bladder    Patient states she is waking up 3-4 times a night to use the bathroom would like to talk about med changes    HYPERTENSION / HYPERLIPIDEMIA Satisfied with current treatment? yes Duration of hypertension: chronic BP monitoring frequency: not checking BP medication side effects: no Past BP meds: amlodipine, valsartan, HCTZ, metoprolol Duration of hyperlipidemia: chronic Cholesterol medication side effects: no Cholesterol supplements: none Past cholesterol medications: atorvastatin Medication compliance: excellent compliance Aspirin: no Recent stressors: no Recurrent headaches: no Visual changes: no Palpitations: no Dyspnea: no Chest pain: no Lower extremity edema: no Dizzy/lightheaded: no  DEPRESSION Mood status: controlled Satisfied with current treatment?: yes Symptom severity: mild  Duration of current treatment : not on anything right now Depressed mood: no Anxious mood: no Anhedonia: no Significant weight loss or gain: no Insomnia: no  Fatigue: yes Feelings of worthlessness or guilt: no Impaired concentration/indecisiveness: no Suicidal ideations: no Hopelessness: no Crying spells: no Depression screen [facility] 2/9 11/07/2020 05/07/2020 02/13/2020 09/27/2019 03/27/2019  Decreased Interest 0 0 0 0 0  Down, Depressed, Hopeless 0 0 0 0 0  PHQ - 2 Score 0 0 0 0 0  Altered sleeping 1 0 - 0 0  Tired, decreased energy 0 0 - 0 0  Change in appetite 0 0 - 0 0  Feeling bad or failure about yourself  0 0 - 0 0  Trouble concentrating 0 0 - 0 0  Moving slowly or  fidgety/restless 0 0 - 0 0  Suicidal thoughts 0 0 - 0 0  PHQ-9 Score 1 0 - 0 0  Difficult doing work/chores Not difficult at all Not difficult at all - Not difficult at all Not difficult at all  Some recent data might be hidden   URINARY SYMPTOMS Duration: Chronic Dysuria: no Urinary frequency: yes - 3+ times at night, every 1-2 hours Urgency: no Small volume voids: no Symptom severity: moderate Urinary incontinence: no Foul odor: no Hematuria: no Abdominal pain: no Back pain: no Suprapubic pain/pressure: no Flank pain: no Fever:  no Vomiting: no Relief with cranberry juice: no Relief with pyridium: no Status: stable Previous urinary tract infection: yes Recurrent urinary tract infection: no Sexual activity: No sexually active History of sexually transmitted disease: no Vaginal discharge: no Treatments attempted: myrbetric  Relevant past medical, surgical, family and social history reviewed and updated as indicated. Interim medical history since our last visit reviewed. Allergies and medications reviewed and updated.  Review of Systems  Constitutional: Negative.   Respiratory: Negative.   Cardiovascular: Negative.   Gastrointestinal: Negative.   Genitourinary: Positive for frequency. Negative for decreased urine volume, difficulty urinating, dyspareunia, dysuria, enuresis, flank pain, genital sores, hematuria, menstrual problem, pelvic pain, urgency, vaginal bleeding, vaginal discharge and vaginal pain.  Musculoskeletal: Negative.   Psychiatric/Behavioral: Negative.     Per HPI unless specifically indicated above     Objective:    BP 131/80   Pulse (!) 57   Temp  97.8 F (36.6 C)   Wt 222 lb 9.6 oz (101 kg)   LMP  (LMP Unknown)   SpO2 99%   BMI 41.49 kg/m   Wt Readings from Last 3 Encounters:  11/07/20 222 lb 9.6 oz (101 kg)  05/07/20 225 lb 12.8 oz (102.4 kg)  04/28/20 228 lb (103.4 kg)    Physical Exam Vitals and nursing note reviewed.   Constitutional:      General: She is not in acute distress.    Appearance: Normal appearance. She is not ill-appearing, toxic-appearing or diaphoretic.  HENT:     Head: Normocephalic and atraumatic.     Right Ear: External ear normal.     Left Ear: External ear normal.     Nose: Nose normal.     Mouth/Throat:     Mouth: Mucous membranes are moist.     Pharynx: Oropharynx is clear.  Eyes:     General: No scleral icterus.       Right eye: No discharge.        Left eye: No discharge.     Extraocular Movements: Extraocular movements intact.     Conjunctiva/sclera: Conjunctivae normal.     Pupils: Pupils are equal, round, and reactive to light.  Cardiovascular:     Rate and Rhythm: Normal rate and regular rhythm.     Pulses: Normal pulses.     Heart sounds: Normal heart sounds. No murmur heard. No friction rub. No gallop.   Pulmonary:     Effort: Pulmonary effort is normal. No respiratory distress.     Breath sounds: Normal breath sounds. No stridor. No wheezing, rhonchi or rales.  Chest:     Chest wall: No tenderness.  Musculoskeletal:        General: Normal range of motion.     Cervical back: Normal range of motion and neck supple.  Skin:    General: Skin is warm and dry.     Capillary Refill: Capillary refill takes less than 2 seconds.     Coloration: Skin is not jaundiced or pale.     Findings: No bruising, erythema, lesion or rash.  Neurological:     General: No focal deficit present.     Mental Status: She is alert and oriented to person, place, and time. Mental status is at baseline.  Psychiatric:        Mood and Affect: Mood normal.        Behavior: Behavior normal.        Thought Content: Thought content normal.        Judgment: Judgment normal.     Results for orders placed or performed in visit on 05/07/20  CBC with Differential/Platelet  Result Value Ref Range   WBC 8.3 3.4 - 10.8 x10E3/uL   RBC 4.46 3.77 - 5.28 x10E6/uL   Hemoglobin 14.0 11.1 - 15.9 g/dL    Hematocrit 63.1 49.7 - 46.6 %   MCV 95 79 - 97 fL   MCH 31.4 26.6 - 33.0 pg   MCHC 33.1 31.5 - 35.7 g/dL   RDW 02.6 37.8 - 58.8 %   Platelets 196 150 - 450 x10E3/uL   Neutrophils 69 Not Estab. %   Lymphs 19 Not Estab. %   Monocytes 7 Not Estab. %   Eos 4 Not Estab. %   Basos 1 Not Estab. %   Neutrophils Absolute 5.8 1.4 - 7.0 x10E3/uL   Lymphocytes Absolute 1.6 0.7 - 3.1 x10E3/uL   Monocytes Absolute 0.6 0.1 - 0.9  x10E3/uL   EOS (ABSOLUTE) 0.3 0.0 - 0.4 x10E3/uL   Basophils Absolute 0.1 0.0 - 0.2 x10E3/uL   Immature Granulocytes 0 Not Estab. %   Immature Grans (Abs) 0.0 0.0 - 0.1 x10E3/uL  Comprehensive metabolic panel  Result Value Ref Range   Glucose 96 65 - 99 mg/dL   BUN 23 8 - 27 mg/dL   Creatinine, Ser 6.960.82 0.57 - 1.00 mg/dL   GFR calc non Af Amer 73 >59 mL/min/1.73   GFR calc Af Amer 84 >59 mL/min/1.73   BUN/Creatinine Ratio 28 12 - 28   Sodium 141 134 - 144 mmol/L   Potassium 4.2 3.5 - 5.2 mmol/L   Chloride 102 96 - 106 mmol/L   CO2 25 20 - 29 mmol/L   Calcium 10.7 (H) 8.7 - 10.3 mg/dL   Total Protein 7.1 6.0 - 8.5 g/dL   Albumin 4.4 3.8 - 4.8 g/dL   Globulin, Total 2.7 1.5 - 4.5 g/dL   Albumin/Globulin Ratio 1.6 1.2 - 2.2   Bilirubin Total 0.6 0.0 - 1.2 mg/dL   Alkaline Phosphatase 93 48 - 121 IU/L   AST 21 0 - 40 IU/L   ALT 19 0 - 32 IU/L  Lipid Panel w/o Chol/HDL Ratio  Result Value Ref Range   Cholesterol, Total 143 100 - 199 mg/dL   Triglycerides 295118 0 - 149 mg/dL   HDL 47 >28>39 mg/dL   VLDL Cholesterol Cal 21 5 - 40 mg/dL   LDL Chol Calc (NIH) 75 0 - 99 mg/dL  TSH  Result Value Ref Range   TSH 0.737 0.450 - 4.500 uIU/mL  VITAMIN D 25 Hydroxy (Vit-D Deficiency, Fractures)  Result Value Ref Range   Vit D, 25-Hydroxy 29.8 (L) 30.0 - 100.0 ng/mL      Assessment & Plan:   Problem List Items Addressed This Visit      Genitourinary   Benign hypertensive renal disease - Primary    Under good control on current regimen. Continue current regimen.  Continue to monitor. Call with any concerns. Refills given. Labs drawn today.        Relevant Medications   valsartan-hydrochlorothiazide (DIOVAN-HCT) 160-25 MG tablet   metoprolol succinate (TOPROL-XL) 100 MG 24 hr tablet   amLODipine (NORVASC) 5 MG tablet   Other Relevant Orders   CBC with Differential/Platelet   Comprehensive metabolic panel   Overactive bladder    Acting up. Will increase her myrbetric. Call with any concerns. Recheck 6 months. Call with any concerns.         Other   Major depression in remission [facility](HCC)    Doing well off medicine. Continue to monitor. Call with any concerns.       Relevant Orders   CBC with Differential/Platelet   Comprehensive metabolic panel   Hypercholesteremia    Under good control on current regimen. Continue current regimen. Continue to monitor. Call with any concerns. Refills given. Labs drawn today.        Relevant Medications   valsartan-hydrochlorothiazide (DIOVAN-HCT) 160-25 MG tablet   metoprolol succinate (TOPROL-XL) 100 MG 24 hr tablet   atorvastatin (LIPITOR) 20 MG tablet   amLODipine (NORVASC) 5 MG tablet   Other Relevant Orders   CBC with Differential/Platelet   Comprehensive metabolic panel   Lipid Panel w/o Chol/HDL Ratio   Avitaminosis D    Rechecking labs today. Await results. Treat as needed.       Relevant Orders   CBC with Differential/Platelet   Comprehensive metabolic panel  VITAMIN D 25 Hydroxy (Vit-D Deficiency, Fractures)       Follow up plan: Return in about 6 months (around 05/07/2021) for physical.

## 2020-11-07 NOTE — Assessment & Plan Note (Signed)
Under good control on current regimen. Continue current regimen. Continue to monitor. Call with any concerns. Refills given. Labs drawn today.   

## 2020-11-07 NOTE — Assessment & Plan Note (Signed)
Rechecking labs today. Await results. Treat as needed.  °

## 2020-11-07 NOTE — Assessment & Plan Note (Signed)
Acting up. Will increase her myrbetric. Call with any concerns. Recheck 6 months. Call with any concerns.

## 2020-11-08 LAB — CBC WITH DIFFERENTIAL/PLATELET
Basophils Absolute: 0.1 10*3/uL (ref 0.0–0.2)
Basos: 1 %
EOS (ABSOLUTE): 0.2 10*3/uL (ref 0.0–0.4)
Eos: 2 %
Hematocrit: 44.5 % (ref 34.0–46.6)
Hemoglobin: 14.8 g/dL (ref 11.1–15.9)
Immature Grans (Abs): 0 10*3/uL (ref 0.0–0.1)
Immature Granulocytes: 0 %
Lymphocytes Absolute: 1.1 10*3/uL (ref 0.7–3.1)
Lymphs: 14 %
MCH: 30.9 pg (ref 26.6–33.0)
MCHC: 33.3 g/dL (ref 31.5–35.7)
MCV: 93 fL (ref 79–97)
Monocytes Absolute: 0.5 10*3/uL (ref 0.1–0.9)
Monocytes: 7 %
Neutrophils Absolute: 6 10*3/uL (ref 1.4–7.0)
Neutrophils: 76 %
Platelets: 213 10*3/uL (ref 150–450)
RBC: 4.79 x10E6/uL (ref 3.77–5.28)
RDW: 12 % (ref 11.7–15.4)
WBC: 7.9 10*3/uL (ref 3.4–10.8)

## 2020-11-08 LAB — COMPREHENSIVE METABOLIC PANEL
ALT: 23 IU/L (ref 0–32)
AST: 25 IU/L (ref 0–40)
Albumin/Globulin Ratio: 1.7 (ref 1.2–2.2)
Albumin: 4.7 g/dL (ref 3.8–4.8)
Alkaline Phosphatase: 92 IU/L (ref 44–121)
BUN/Creatinine Ratio: 24 (ref 12–28)
BUN: 18 mg/dL (ref 8–27)
Bilirubin Total: 0.7 mg/dL (ref 0.0–1.2)
CO2: 25 mmol/L (ref 20–29)
Calcium: 10.9 mg/dL — ABNORMAL HIGH (ref 8.7–10.3)
Chloride: 100 mmol/L (ref 96–106)
Creatinine, Ser: 0.76 mg/dL (ref 0.57–1.00)
GFR calc Af Amer: 92 mL/min/{1.73_m2} (ref >59)
GFR calc non Af Amer: 80 mL/min/{1.73_m2} (ref >59)
Globulin, Total: 2.7 g/dL (ref 1.5–4.5)
Glucose: 93 mg/dL (ref 65–99)
Potassium: 4.4 mmol/L (ref 3.5–5.2)
Sodium: 141 mmol/L (ref 134–144)
Total Protein: 7.4 g/dL (ref 6.0–8.5)

## 2020-11-08 LAB — LIPID PANEL W/O CHOL/HDL RATIO
Cholesterol, Total: 165 mg/dL (ref 100–199)
HDL: 53 mg/dL (ref >39)
LDL Chol Calc (NIH): 86 mg/dL (ref 0–99)
Triglycerides: 152 mg/dL — ABNORMAL HIGH (ref 0–149)
VLDL Cholesterol Cal: 26 mg/dL (ref 5–40)

## 2020-11-08 LAB — VITAMIN D 25 HYDROXY (VIT D DEFICIENCY, FRACTURES): Vit D, 25-Hydroxy: 31.3 ng/mL (ref 30.0–100.0)

## 2020-11-10 ENCOUNTER — Encounter: Payer: Self-pay | Admitting: Family Medicine

## 2020-11-11 NOTE — Telephone Encounter (Signed)
error 

## 2020-11-19 ENCOUNTER — Ambulatory Visit
Admission: RE | Admit: 2020-11-19 | Discharge: 2020-11-19 | Disposition: A | Discharge status: Home / Self Care | Payer: Medicare Other | Source: Ambulatory Visit | Attending: Family Medicine | Admitting: Family Medicine

## 2020-11-19 ENCOUNTER — Other Ambulatory Visit: Payer: Self-pay

## 2020-11-19 DIAGNOSIS — Z1231 Encounter for screening mammogram for malignant neoplasm of breast: Secondary | ICD-10-CM | POA: Diagnosis not present

## 2020-11-21 ENCOUNTER — Encounter: Payer: Self-pay | Admitting: Family Medicine

## 2020-12-12 DIAGNOSIS — H2513 Age-related nuclear cataract, bilateral: Secondary | ICD-10-CM | POA: Diagnosis not present

## 2021-01-15 ENCOUNTER — Encounter: Payer: Self-pay | Admitting: Podiatry

## 2021-01-15 ENCOUNTER — Ambulatory Visit: Payer: Medicare Other | Admitting: Podiatry

## 2021-01-15 ENCOUNTER — Other Ambulatory Visit: Payer: Self-pay

## 2021-01-15 DIAGNOSIS — L603 Nail dystrophy: Secondary | ICD-10-CM | POA: Diagnosis not present

## 2021-01-15 NOTE — Progress Notes (Signed)
Subjective:  Patient ID: Anne Bell, female    DOB: 1951-03-06,  MRN: [id number]  Chief Complaint  Patient presents with  . Nail Problem  . Callouses    Patient presents for calluses bilat hallux and ingrown right hallux medial border    70 y.o. female presents with the above complaint.  Patient presents with ingrown right hallux medial nail border.  Patient states that there is underlying dystrophy.  She had a removed previously by Dr. Al Corpus.  She states it started to hurt back again.  She denies any other acute complaints she would like to do a slant back procedure.  She does not want it removed again.  It is sometimes bothersome but not as much.  She denies any other treatment options.   Review of Systems: Negative except as noted in the HPI. Denies N/V/F/Ch.  Past Medical History:  Diagnosis Date  . Allergy   . Depression   . Hyperlipidemia   . Hypertension     Current Outpatient Medications:  .  amLODipine (NORVASC) 5 MG tablet, Take 1 tablet (5 mg total) by mouth daily., Disp: 90 tablet, Rfl: 0 .  atorvastatin (LIPITOR) 20 MG tablet, Take 1 tablet (20 mg total) by mouth daily., Disp: 90 tablet, Rfl: 0 .  fluticasone (FLONASE) 50 MCG/ACT nasal spray, Place 2 sprays into both nostrils daily., Disp: 48 g, Rfl: 3 .  Lutein 6 MG CAPS, Take 6 mg by mouth daily. , Disp: , Rfl:  .  Magnesium 500 MG CAPS, Take by mouth. (Patient not taking: Reported on 11/07/2020), Disp: , Rfl:  .  metoprolol succinate (TOPROL-XL) 100 MG 24 hr tablet, Take with or immediately following a meal., Disp: 90 tablet, Rfl: 0 .  mirabegron ER (MYRBETRIQ) 50 MG TB24 tablet, Take 1 tablet (50 mg total) by mouth daily., Disp: 90 tablet, Rfl: 1 .  MULTIPLE VITAMIN PO, Take 1 tablet by mouth daily. , Disp: , Rfl:  .  valsartan-hydrochlorothiazide (DIOVAN-HCT) 160-25 MG tablet, Take 1 tablet by mouth daily., Disp: 90 tablet, Rfl: 0  Social History   Tobacco Use  Smoking Status Never Smoker  Smokeless Tobacco  Never Used    Allergies  Allergen Reactions  . Celecoxib     stomach cramps  . Prednisone Other (See Comments)  . Sulfa Antibiotics     Unknown allergy   Objective:  There were no vitals filed for this visit. There is no height or weight on file to calculate BMI. Constitutional Well developed. Well nourished.  Vascular Dorsalis pedis pulses palpable bilaterally. Posterior tibial pulses palpable bilaterally. Capillary refill normal to all digits.  No cyanosis or clubbing noted. Pedal hair growth normal.  Neurologic Normal speech. Oriented to person, place, and time. Epicritic sensation to light touch grossly present bilaterally.  Dermatologic  thickened elongated dystrophic ingrown nail noted to the right hallux medial border.  Mild pain on palpation  Orthopedic: Normal joint ROM without pain or crepitus bilaterally. No visible deformities. No bony tenderness.   Radiographs: None Assessment:   1. Nail dystrophy    Plan:  Patient was evaluated and treated and all questions answered.  Right hallux ingrown with very mild pain -I explained to the patient the recurrence of right hallux ingrown nail border adverse treatment options were discussed.  Clinically her pain is not too severe and therefore I believe patient will benefit from a slant back procedure.  I discussed this with the patient in extensive detail she states understanding would like to proceed  with that.  No follow-ups on file.

## 2021-02-16 ENCOUNTER — Ambulatory Visit (INDEPENDENT_AMBULATORY_CARE_PROVIDER_SITE_OTHER): Payer: Medicare Other

## 2021-02-16 VITALS — Ht 61.0 in | Wt 220.0 lb

## 2021-02-16 DIAGNOSIS — Z0000 Encounter for general adult medical examination without abnormal findings: Secondary | ICD-10-CM | POA: Diagnosis not present

## 2021-02-16 NOTE — Progress Notes (Signed)
I connected with Anne Bell today by telephone and verified that I am speaking with the correct person using two identifiers. Location patient: home Location provider: work Persons participating in the virtual visit: Anne Bell, Elisha Ponder LPN.   I discussed the limitations, risks, security and privacy concerns of performing an evaluation and management service by telephone and the availability of in person appointments. I also discussed with the patient that there may be a patient responsible charge related to this service. The patient expressed understanding and verbally consented to this telephonic visit.    Interactive audio and video telecommunications were attempted between this provider and patient, however failed, due to patient having technical difficulties OR patient did not have access to video capability.  We continued and completed visit with audio only.     Vital signs may be patient reported or missing.  Subjective:   Anne Bell is a 70 y.o. female who presents for Medicare Annual (Subsequent) preventive examination.  Review of Systems     Cardiac Risk Factors include: advanced age (>43men, >81 women);hypertension;obesity (BMI >30kg/m2);sedentary lifestyle     Objective:    Today's Vitals   02/16/21 1256  Weight: 220 lb (99.8 kg)  Height: 5\' 1"  (1.549 m)   Body mass index is 41.57 kg/m.  Advanced Directives 02/16/2021 02/13/2020 02/22/2019 01/09/2018 12/28/2016 08/21/2015  Does Patient Have a Medical Advance Directive? Yes Yes No Yes Yes No  Type of 14/04/2015 of [place];Living will Living will;Healthcare Power of [place] - Healthcare Power of [place];Living will Healthcare Power of [place];Living will -  Does patient want to make changes to medical advance directive? - - - - No - Patient declined -  Copy of Healthcare Power of Attorney in Chart? No - copy requested No - copy requested - No - copy requested No - copy requested -   Would patient like information on creating a medical advance directive? - - - - - Yes - Educational materials given    Current Medications (verified) Outpatient Encounter Medications as of 02/16/2021  Medication Sig  . amLODipine (NORVASC) 5 MG tablet Take 1 tablet (5 mg total) by mouth daily.  04/18/2021 atorvastatin (LIPITOR) 20 MG tablet Take 1 tablet (20 mg total) by mouth daily.  . fluticasone (FLONASE) 50 MCG/ACT nasal spray Place 2 sprays into both nostrils daily.  . Lutein 6 MG CAPS Take 6 mg by mouth daily.   . metoprolol succinate (TOPROL-XL) 100 MG 24 hr tablet Take with or immediately following a meal.  . mirabegron ER (MYRBETRIQ) 50 MG TB24 tablet Take 1 tablet (50 mg total) by mouth daily.  . MULTIPLE VITAMIN PO Take 1 tablet by mouth daily.   . valsartan-hydrochlorothiazide (DIOVAN-HCT) 160-25 MG tablet Take 1 tablet by mouth daily.  . Magnesium 500 MG CAPS Take by mouth. (Patient not taking: No sig reported)   No facility-administered encounter medications on file as of 02/16/2021.    Allergies (verified) Celecoxib, Prednisone, and Sulfa antibiotics   History: Past Medical History:  Diagnosis Date  . Allergy   . Depression   . Hyperlipidemia   . Hypertension    Past Surgical History:  Procedure Laterality Date  . BREAST CYST ASPIRATION  1970's   ? side  . PLANTAR FASCIA SURGERY Left 2008   Family History  Problem Relation Age of Onset  . Alcohol abuse Father   . Mental illness Mother        Depression  . Cancer Mother  started in back   . Brain cancer Sister   . Breast cancer Neg Hx    Social History   Socioeconomic History  . Marital status: Divorced    Spouse name: Not on file  . Number of children: Not on file  . Years of education: Not on file  . Highest education level: Some college, no degree  Occupational History  . Occupation: Retired  Tobacco Use  . Smoking status: Never Smoker  . Smokeless tobacco: Never Used  Vaping Use  . Vaping Use:  Never used  Substance and Sexual Activity  . Alcohol use: Yes    Comment: occasional  . Drug use: No  . Sexual activity: Yes    Birth control/protection: None  Other Topics Concern  . Not on file  Social History Narrative  . Not on file   Social Determinants of Health   Financial Resource Strain: Low Risk   . Difficulty of Paying Living Expenses: Not hard at all  Food Insecurity: No Food Insecurity  . Worried About Programme researcher, broadcasting/film/video in the Last Year: Never true  . Ran Out of Food in the Last Year: Never true  Transportation Needs: No Transportation Needs  . Lack of Transportation (Medical): No  . Lack of Transportation (Non-Medical): No  Physical Activity: Inactive  . Days of Exercise per Week: 0 days  . Minutes of Exercise per Session: 0 min  Stress: No Stress Concern Present  . Feeling of Stress : Not at all  Social Connections: Not on file    Tobacco Counseling Counseling given: Not Answered   Clinical Intake:  Pre-visit preparation completed: Yes  Pain : No/denies pain     Nutritional Status: BMI > 30  Obese Nutritional Risks: None Diabetes: No  How often do you need to have someone help you when you read instructions, pamphlets, or other written materials from your doctor or pharmacy?: 1 - Never What is the last grade level you completed in school?: 32yrs college  Diabetic? no  Interpreter Needed?: No  Information entered by :: NAllen LPN   Activities of Daily Living In your present state of health, do you have any difficulty performing the following activities: 02/16/2021 05/07/2020  Hearing? N N  Vision? N N  Difficulty concentrating or making decisions? N N  Walking or climbing stairs? N N  Dressing or bathing? N N  Doing errands, shopping? N N  Preparing Food and eating ? N -  Using the Toilet? N -  In the past six months, have you accidently leaked urine? Y -  Comment with sneezing -  Do you have problems with loss of bowel control? N -   Managing your Medications? N -  Managing your Finances? N -  Housekeeping or managing your Housekeeping? N -  Some recent data might be hidden    Patient Care Team: Dorcas Carrow, DO as PCP - General (Family Medicine)  Indicate any recent Medical Services you may have received from other than [facility] providers in the past year (date may be approximate).     Assessment:   This is a routine wellness examination for Francisca.  Hearing/Vision screen No exam data present  Dietary issues and exercise activities discussed: Current Exercise Habits: The patient does not participate in regular exercise at present  Goals Addressed            This Visit's Progress   . Patient Stated       02/16/2021, continue losing weight  Depression Screen PHQ 2/9 Scores 02/16/2021 11/07/2020 05/07/2020 02/13/2020 09/27/2019 03/27/2019 02/22/2019  PHQ - 2 Score 0 0 0 0 0 0 0  PHQ- 9 Score - 1 0 - 0 0 -    Fall Risk Fall Risk  02/16/2021 05/07/2020 02/13/2020 03/27/2019 02/22/2019  Falls in the past year? 1 0 0 0 0  Comment got dizzy and ankle gave away - - - -  Number falls in past yr: 0 0 0 0 -  Injury with Fall? 0 0 0 0 -  Risk for fall due to : Medication side effect - - - -  Follow up Falls evaluation completed;Education provided;Falls prevention discussed - - - -    FALL RISK PREVENTION PERTAINING TO THE HOME:  Any stairs in or around the home? Yes  If so, are there any without handrails? No  Home free of loose throw rugs in walkways, pet beds, electrical cords, etc? Yes  Adequate lighting in your home to reduce risk of falls? Yes   ASSISTIVE DEVICES UTILIZED TO PREVENT FALLS:  Life alert? No  Use of a cane, walker or w/c? No  Grab bars in the bathroom? Yes  Shower chair or bench in shower? No  Elevated toilet seat or a handicapped toilet? Yes   TIMED UP AND GO:  Was the test performed? No .   Cognitive Function:     6CIT Screen 02/16/2021 02/22/2019 01/09/2018 12/28/2016  What Year? 0  points 0 points 0 points 0 points  What month? 0 points 0 points 0 points 0 points  What time? 0 points 0 points 0 points 0 points  Count back from 20 0 points 0 points 0 points 0 points  Months in reverse 0 points 0 points 0 points 2 points  Repeat phrase 2 points 0 points 0 points 4 points  Total Score 2 0 0 6    Immunizations Immunization History  Administered Date(s) Administered  . Influenza Whole 06/13/2017  . Influenza, High Dose Seasonal PF 06/20/2018, 06/13/2020  . Influenza-Unspecified 05/31/2015, 06/13/2017, 06/12/2019  . PFIZER(Purple Top)SARS-COV-2 Vaccination 11/09/2019, 12/04/2019  . Pneumococcal Conjugate-13 05/31/2015  . Pneumococcal Polysaccharide-23 12/28/2016  . Tdap 02/24/2016  . Zoster, Live 05/28/2014    TDAP status: Up to date  Flu Vaccine status: Up to date  Pneumococcal vaccine status: Up to date  Covid-19 vaccine status: Completed vaccines  Qualifies for Shingles Vaccine? Yes   Zostavax completed Yes   Shingrix Completed?: No.    Education has been provided regarding the importance of this vaccine. Patient has been advised to call insurance company to determine out of pocket expense if they have not yet received this vaccine. Advised may also receive vaccine at local pharmacy or Health Dept. Verbalized acceptance and understanding.  Screening Tests Health Maintenance  Topic Date Due  . Zoster Vaccines- Shingrix (1 of 2) Never done  . COVID-19 Vaccine (3 - Booster for Pfizer series) 05/05/2020  . INFLUENZA VACCINE  04/13/2021  . Fecal DNA (Cologuard)  08/15/2021  . MAMMOGRAM  11/20/2022  . TETANUS/TDAP  02/23/2026  . DEXA SCAN  Completed  . Hepatitis C Screening  Completed  . PNA vac Low Risk Adult  Completed  . Pneumococcal Vaccine 740-71 Years old  Aged Out  . HPV VACCINES  Aged Out    Health Maintenance  Health Maintenance Due  Topic Date Due  . Zoster Vaccines- Shingrix (1 of 2) Never done  . COVID-19 Vaccine (3 - Booster for Pfizer  series) 05/05/2020  Colorectal cancer screening: Type of screening: Cologuard. Completed 08/16/2018. Repeat every 3 years  Mammogram status: Completed 11/19/2020. Repeat every year  Bone Density status: Completed 08/05/2015.   Lung Cancer Screening: (Low Dose CT Chest recommended if Age 33-80 years, 30 pack-year currently smoking OR have quit w/in 15years.) does not qualify.   Lung Cancer Screening Referral: no  Additional Screening:  Hepatitis C Screening: does qualify; Completed 08/21/2015  Vision Screening: Recommended annual ophthalmology exams for early detection of glaucoma and other disorders of the eye. Is the patient up to date with their annual eye exam?  Yes  Who is the provider or what is the name of the office in which the patient attends annual eye exams? [facility] If pt is not established with a provider, would they like to be referred to a provider to establish care? No .   Dental Screening: Recommended annual dental exams for proper oral hygiene  Community Resource Referral / Chronic Care Management: CRR required this visit?  No   CCM required this visit?  No      Plan:     I have personally reviewed and noted the following in the patient's chart:   . Medical and social history . Use of alcohol, tobacco or illicit drugs  . Current medications and supplements including opioid prescriptions.  . Functional ability and status . Nutritional status . Physical activity . Advanced directives . List of other physicians . Hospitalizations, surgeries, and ER visits in previous 12 months . Vitals . Screenings to include cognitive, depression, and falls . Referrals and appointments  In addition, I have reviewed and discussed with patient certain preventive protocols, quality metrics, and best practice recommendations. A written personalized care plan for preventive services as well as general preventive health recommendations were provided to patient.      Barb Merino, LPN   [birth year]   Nurse Notes:

## 2021-02-16 NOTE — Patient Instructions (Signed)
Anne Bell , Thank you for taking time to come for your Medicare Wellness Visit. I appreciate your ongoing commitment to your health goals. Please review the following plan we discussed and let me know if I can assist you in the future.   Screening recommendations/referrals: Colonoscopy: cologuard 08/16/2018, due 08/16/2021 Mammogram: completed 11/19/2020 Bone Density: completed 08/05/2015 Recommended yearly ophthalmology/optometry visit for glaucoma screening and checkup Recommended yearly dental visit for hygiene and checkup  Vaccinations: Influenza vaccine: completed 06/13/2020, due 04/13/2021 Pneumococcal vaccine: completed 12/28/2016 Tdap vaccine: completed 02/24/2016, due 02/23/2026 Shingles vaccine: discussed   Covid-19: 02/08/2021, 12/04/2019, 11/09/2019  Advanced directives: Please bring a copy of your POA (Power of Attorney) and/or Living Will to your next appointment.   Conditions/risks identified: none  Next appointment: Follow up in one year for your annual wellness visit    Preventive Care 65 Years and Older, Female Preventive care refers to lifestyle choices and visits with your health care provider that can promote health and wellness. What does preventive care include?  A yearly physical exam. This is also called an annual well check.  Dental exams once or twice a year.  Routine eye exams. Ask your health care provider how often you should have your eyes checked.  Personal lifestyle choices, including:  Daily care of your teeth and gums.  Regular physical activity.  Eating a healthy diet.  Avoiding tobacco and drug use.  Limiting alcohol use.  Practicing safe sex.  Taking low-dose aspirin every day.  Taking vitamin and mineral supplements as recommended by your health care provider. What happens during an annual well check? The services and screenings done by your health care provider during your annual well check will depend on your age, overall health,  lifestyle risk factors, and family history of disease. Counseling  Your health care provider may ask you questions about your:  Alcohol use.  Tobacco use.  Drug use.  Emotional well-being.  Home and relationship well-being.  Sexual activity.  Eating habits.  History of falls.  Memory and ability to understand (cognition).  Work and work Astronomer.  Reproductive health. Screening  You may have the following tests or measurements:  Height, weight, and BMI.  Blood pressure.  Lipid and cholesterol levels. These may be checked every 5 years, or more frequently if you are over 91 years old.  Skin check.  Lung cancer screening. You may have this screening every year starting at age 28 if you have a 30-pack-year history of smoking and currently smoke or have quit within the past 15 years.  Fecal occult blood test (FOBT) of the stool. You may have this test every year starting at age 46.  Flexible sigmoidoscopy or colonoscopy. You may have a sigmoidoscopy every 5 years or a colonoscopy every 10 years starting at age 61.  Hepatitis C blood test.  Hepatitis B blood test.  Sexually transmitted disease (STD) testing.  Diabetes screening. This is done by checking your blood sugar (glucose) after you have not eaten for a while (fasting). You may have this done every 1-3 years.  Bone density scan. This is done to screen for osteoporosis. You may have this done starting at age 8.  Mammogram. This may be done every 1-2 years. Talk to your health care provider about how often you should have regular mammograms. Talk with your health care provider about your test results, treatment options, and if necessary, the need for more tests. Vaccines  Your health care provider may recommend certain vaccines, such as:  Influenza vaccine. This is recommended every year.  Tetanus, diphtheria, and acellular pertussis (Tdap, Td) vaccine. You may need a Td booster every 10 years.  Zoster  vaccine. You may need this after age 61.  Pneumococcal 13-valent conjugate (PCV13) vaccine. One dose is recommended after age 52.  Pneumococcal polysaccharide (PPSV23) vaccine. One dose is recommended after age 66. Talk to your health care provider about which screenings and vaccines you need and how often you need them. This information is not intended to replace advice given to you by your health care provider. Make sure you discuss any questions you have with your health care provider. Document Released: 09/26/2015 Document Revised: 05/19/2016 Document Reviewed: 07/01/2015 Elsevier Interactive Patient Education  2017 [employer] Prevention in the Home Falls can cause injuries. They can happen to people of all ages. There are many things you can do to make your home safe and to help prevent falls. What can I do on the outside of my home?  Regularly fix the edges of walkways and driveways and fix any cracks.  Remove anything that might make you trip as you walk through a door, such as a raised step or threshold.  Trim any bushes or trees on the path to your home.  Use bright outdoor lighting.  Clear any walking paths of anything that might make someone trip, such as rocks or tools.  Regularly check to see if handrails are loose or broken. Make sure that both sides of any steps have handrails.  Any raised decks and porches should have guardrails on the edges.  Have any leaves, snow, or ice cleared regularly.  Use sand or salt on walking paths during winter.  Clean up any spills in your garage right away. This includes oil or grease spills. What can I do in the bathroom?  Use night lights.  Install grab bars by the toilet and in the tub and shower. Do not use towel bars as grab bars.  Use non-skid mats or decals in the tub or shower.  If you need to sit down in the shower, use a plastic, non-slip stool.  Keep the floor dry. Clean up any water that spills on the  floor as soon as it happens.  Remove soap buildup in the tub or shower regularly.  Attach bath mats securely with double-sided non-slip rug tape.  Do not have throw rugs and other things on the floor that can make you trip. What can I do in the bedroom?  Use night lights.  Make sure that you have a light by your bed that is easy to reach.  Do not use any sheets or blankets that are too big for your bed. They should not hang down onto the floor.  Have a firm chair that has side arms. You can use this for support while you get dressed.  Do not have throw rugs and other things on the floor that can make you trip. What can I do in the kitchen?  Clean up any spills right away.  Avoid walking on wet floors.  Keep items that you use a lot in easy-to-reach places.  If you need to reach something above you, use a strong step stool that has a grab bar.  Keep electrical cords out of the way.  Do not use floor polish or wax that makes floors slippery. If you must use wax, use non-skid floor wax.  Do not have throw rugs and other things on the floor that  can make you trip. What can I do with my stairs?  Do not leave any items on the stairs.  Make sure that there are handrails on both sides of the stairs and use them. Fix handrails that are broken or loose. Make sure that handrails are as long as the stairways.  Check any carpeting to make sure that it is firmly attached to the stairs. Fix any carpet that is loose or worn.  Avoid having throw rugs at the top or bottom of the stairs. If you do have throw rugs, attach them to the floor with carpet tape.  Make sure that you have a light switch at the top of the stairs and the bottom of the stairs. If you do not have them, ask someone to add them for you. What else can I do to help prevent falls?  Wear shoes that:  Do not have high heels.  Have rubber bottoms.  Are comfortable and fit you well.  Are closed at the toe. Do not wear  sandals.  If you use a stepladder:  Make sure that it is fully opened. Do not climb a closed stepladder.  Make sure that both sides of the stepladder are locked into place.  Ask someone to hold it for you, if possible.  Clearly mark and make sure that you can see:  Any grab bars or handrails.  First and last steps.  Where the edge of each step is.  Use tools that help you move around (mobility aids) if they are needed. These include:  Canes.  Walkers.  Scooters.  Crutches.  Turn on the lights when you go into a dark area. Replace any light bulbs as soon as they burn out.  Set up your furniture so you have a clear path. Avoid moving your furniture around.  If any of your floors are uneven, fix them.  If there are any pets around you, be aware of where they are.  Review your medicines with your doctor. Some medicines can make you feel dizzy. This can increase your chance of falling. Ask your doctor what other things that you can do to help prevent falls. This information is not intended to replace advice given to you by your health care provider. Make sure you discuss any questions you have with your health care provider. Document Released: 06/26/2009 Document Revised: 02/05/2016 Document Reviewed: 10/04/2014 Elsevier Interactive Patient Education  2017 [employer].

## 2021-02-19 ENCOUNTER — Ambulatory Visit: Payer: Medicare Other

## 2021-02-19 DIAGNOSIS — H524 Presbyopia: Secondary | ICD-10-CM | POA: Diagnosis not present

## 2021-03-04 ENCOUNTER — Other Ambulatory Visit: Payer: Self-pay | Admitting: Family Medicine

## 2021-03-04 DIAGNOSIS — E7800 Pure hypercholesterolemia, unspecified: Secondary | ICD-10-CM

## 2021-03-04 DIAGNOSIS — I129 Hypertensive chronic kidney disease with stage 1 through stage 4 chronic kidney disease, or unspecified chronic kidney disease: Secondary | ICD-10-CM

## 2021-03-06 ENCOUNTER — Other Ambulatory Visit: Payer: Self-pay | Admitting: Family Medicine

## 2021-03-06 DIAGNOSIS — I129 Hypertensive chronic kidney disease with stage 1 through stage 4 chronic kidney disease, or unspecified chronic kidney disease: Secondary | ICD-10-CM

## 2021-03-06 DIAGNOSIS — E7800 Pure hypercholesterolemia, unspecified: Secondary | ICD-10-CM

## 2021-03-26 ENCOUNTER — Encounter: Payer: Self-pay | Admitting: Family Medicine

## 2021-04-06 ENCOUNTER — Telehealth: Payer: Self-pay

## 2021-04-06 NOTE — Telephone Encounter (Signed)
Called pt no answer left vm. Pt needs to r/s 8/26 appt   Copied from CRM #[id number]. Topic: Appointment Scheduling - Scheduling Inquiry for Clinic >> Apr 06, 2021  8:36 AM Fanny Bien wrote: Reason for CRM Pt called and stated that she received a letter regarding [facility] reschedule. Pt would like a call back to reschedule. Please advise

## 2021-05-05 ENCOUNTER — Other Ambulatory Visit: Payer: Self-pay | Admitting: Family Medicine

## 2021-05-05 NOTE — Telephone Encounter (Signed)
Requested Prescriptions  Pending Prescriptions Disp Refills  . MYRBETRIQ 50 MG TB24 tablet [Pharmacy Med Name: Myrbetriq 50 MG Oral Tablet Extended Release 24 Hour] 90 tablet 1    Sig: TAKE 1 TABLET BY MOUTH  DAILY     Urology: Bladder Agents - mirabegron Passed - 05/05/2021 11:52 PM      Passed - Last BP in normal range    BP Readings from Last 1 Encounters:  11/07/20 131/80         Passed - Valid encounter within last 12 months    Recent Outpatient Visits          5 months ago Benign hypertensive renal disease   [facility] [place], Megan P, DO   12 months ago Routine general medical examination at a health care facility   [facility], [place] P, DO   1 year ago Urinary tract infection without hematuria, site unspecified   [facility] [place], Salley Hews, [place]   1 year ago Avitaminosis D   [facility] [place], [place], DO   2 years ago Routine general medical examination at a health care facility   [facility] [place], Oralia Rud, DO      Future Appointments            In 2 weeks [place], Oralia Rud, DO [facility], [facility]   In 9 months  [facility], [facility]

## 2021-05-08 ENCOUNTER — Encounter: Payer: Medicare Other | Admitting: Family Medicine

## 2021-05-19 ENCOUNTER — Other Ambulatory Visit: Payer: Self-pay | Admitting: Family Medicine

## 2021-05-19 DIAGNOSIS — I129 Hypertensive chronic kidney disease with stage 1 through stage 4 chronic kidney disease, or unspecified chronic kidney disease: Secondary | ICD-10-CM

## 2021-05-19 DIAGNOSIS — E7800 Pure hypercholesterolemia, unspecified: Secondary | ICD-10-CM

## 2021-05-19 NOTE — Telephone Encounter (Signed)
Requested Prescriptions  Pending Prescriptions Disp Refills  . amLODipine (NORVASC) 5 MG tablet [Pharmacy Med Name: amLODIPine Besylate 5 MG Oral Tablet] 90 tablet 0    Sig: TAKE 1 TABLET BY MOUTH  DAILY     Cardiovascular:  Calcium Channel Blockers Passed - 05/19/2021  7:15 AM      Passed - Last BP in normal range    BP Readings from Last 1 Encounters:  11/07/20 131/80         Passed - Valid encounter within last 6 months    Recent Outpatient Visits          6 months ago Benign hypertensive renal disease   [facility] [place], Megan P, DO   1 year ago Routine general medical examination at a health care facility   [facility], [place] P, DO   1 year ago Urinary tract infection without hematuria, site unspecified   [facility] [place], Salley Hews, [place]   1 year ago Avitaminosis D   [facility] [place], [place], DO   2 years ago Routine general medical examination at a health care facility   [facility], [place], DO      Future Appointments            In 2 days Laural Benes, Megan P, DO [facility]   In 9 months  [facility]           . valsartan-hydrochlorothiazide (DIOVAN-HCT) 160-25 MG tablet [Pharmacy Med Name: Valsartan-hydroCHLOROthiazide 160-25 MG Oral Tablet] 90 tablet     Sig: TAKE 1 TABLET BY MOUTH  DAILY     Cardiovascular: ARB + Diuretic Combos Failed - 05/19/2021  7:15 AM      Failed - K in normal range and within 180 days    Potassium  Date Value Ref Range Status  11/07/2020 4.4 3.5 - 5.2 mmol/L Final  10/10/2012 4.1 3.5 - 5.1 mmol/L Final         Failed - Na in normal range and within 180 days    Sodium  Date Value Ref Range Status  11/07/2020 141 134 - 144 mmol/L Final  10/10/2012 138 136 - 145 mmol/L Final         Failed - Cr in normal range and within 180 days    Creatinine  Date Value Ref Range Status  10/10/2012 0.79 0.60 - 1.30  mg/dL Final   Creatinine, Ser  Date Value Ref Range Status  11/07/2020 0.76 0.57 - 1.00 mg/dL Final    Comment:                   **Effective November 10, 2020 [facility] will begin**                  reporting the 2021 CKD-EPI creatinine equation that                  estimates kidney function without a race variable.          Failed - Ca in normal range and within 180 days    Calcium  Date Value Ref Range Status  11/07/2020 10.9 (H) 8.7 - 10.3 mg/dL Final   Calcium, Total  Date Value Ref Range Status  10/10/2012 9.6 8.5 - 10.1 mg/dL Final         Passed - Patient is not pregnant      Passed - Last BP in normal range  BP Readings from Last 1 Encounters:  11/07/20 131/80         Passed - Valid encounter within last 6 months    Recent Outpatient Visits          6 months ago Benign hypertensive renal disease   [facility] [place], Megan P, DO   1 year ago Routine general medical examination at a health care facility   [facility] [place], [place], [place]   1 year ago Urinary tract infection without hematuria, site unspecified   [facility] [place], Sal[place], [place]   1 year ago Avitaminosis D   [facility] [place], [place]   2 years ago Routine general medical examination at a health care facility   [facility] [place], [place], [place]      Future Appointments            In 2 days Laural BenesJohnson, Megan P, DO [facility]   In 9 months  [facility]           . metoprolol succinate (TOPROL-XL) 100 MG 24 hr tablet [Pharmacy Med Name: Metoprolol Succinate ER 100 MG Oral Tablet Extended Release 24 Hour] 90 tablet 0    Sig: TAKE 1 TABLET BY MOUTH  DAILY WITH OR IMMEDIATELY  FOLLOWING A MEAL     Cardiovascular:  Beta Blockers Passed - 05/19/2021  7:15 AM      Passed - Last BP in normal range    BP Readings from Last 1 Encounters:  11/07/20 131/80         Passed - Last Heart  Rate in normal range    Pulse Readings from Last 1 Encounters:  11/07/20 (!) 57         Passed - Valid encounter within last 6 months    Recent Outpatient Visits          6 months ago Benign hypertensive renal disease   [facility] [place], Megan P, DO   1 year ago Routine general medical examination at a health care facility   [facility] [place], [place], [place]   1 year ago Urinary tract infection without hematuria, site unspecified   [facility] [place] Salley Hewsachel [place], [place]   1 year ago Avitaminosis D   [facility] [place], [place], DO   2 years ago Routine general medical examination at a health care facility   [facility] [place], Oralia RudMegan P, DO      Future Appointments            In 2 days Laural BenesJohnson, Oralia RudMegan P, DO [facility]   In 9 months  [facility]           . atorvastatin (LIPITOR) 20 MG tablet [Pharmacy Med Name: Atorvastatin Calcium 20 MG Oral Tablet] 90 tablet 2    Sig: TAKE 1 TABLET BY MOUTH  DAILY     Cardiovascular:  Antilipid - Statins Failed - 05/19/2021  7:15 AM      Failed - Triglycerides in normal range and within 360 days    Triglycerides  Date Value Ref Range Status  11/07/2020 152 (H) 0 - 149 mg/dL Final  16/10/960401/28/2014 540191 0 - 200 mg/dL Final   Triglycerides Piccolo,Waived  Date Value Ref Range Status  02/24/2016 157 (H) <150 mg/dL Final    Comment:  Normal                   <150                         Borderline High     150 - 199                         High                200 - 499                         Very High                >499          Passed - Total Cholesterol in normal range and within 360 days    Cholesterol, Total  Date Value Ref Range Status  11/07/2020 165 100 - 199 mg/dL Final   Cholesterol  Date Value Ref Range Status  10/10/2012 175 0 - 200 mg/dL Final   Cholesterol Piccolo, Waived  Date Value Ref  Range Status  02/24/2016 158 <200 mg/dL Final    Comment:                            Desirable                <200                         Borderline High      200- 239                         High                     >239          Passed - LDL in normal range and within 360 days    Ldl Cholesterol, Calc  Date Value Ref Range Status  10/10/2012 88 0 - 100 mg/dL Final   LDL Chol Calc (NIH)  Date Value Ref Range Status  11/07/2020 86 0 - 99 mg/dL Final         Passed - HDL in normal range and within 360 days    HDL Cholesterol  Date Value Ref Range Status  10/10/2012 49 40 - 60 mg/dL Final   HDL  Date Value Ref Range Status  11/07/2020 53 >39 mg/dL Final         Passed - Patient is not pregnant      Passed - Valid encounter within last 12 months    Recent Outpatient Visits          6 months ago Benign hypertensive renal disease   [facility] [place], Megan P, DO   1 year ago Routine general medical examination at a health care facility   [facility] [place] P, DO   1 year ago Urinary tract infection without hematuria, site unspecified   [facility] [place], Salley Hews, [place]   1 year ago Avitaminosis D   [facility] [place], [place], DO   2 years ago Routine general medical examination at a health care facility   [facility] [place], Oralia Rud, DO      Future Appointments  In 2 days Laural Benes, Oralia Rud, DO [facility]   In 9 months  [facility]

## 2021-05-19 NOTE — Telephone Encounter (Signed)
Requested medication (s) are due for refill today: yes  Requested medication (s) are on the active medication list: yes  Last refill:  03/04/21 #90  Future visit scheduled: yes  Notes to clinic:  needs lab work   Requested Prescriptions  Pending Prescriptions Disp Refills   valsartan-hydrochlorothiazide (DIOVAN-HCT) 160-25 MG tablet [Pharmacy Med Name: Valsartan-hydroCHLOROthiazide 160-25 MG Oral Tablet] 90 tablet     Sig: TAKE 1 TABLET BY MOUTH  DAILY     Cardiovascular: ARB + Diuretic Combos Failed - 05/19/2021  7:15 AM      Failed - K in normal range and within 180 days    Potassium  Date Value Ref Range Status  11/07/2020 4.4 3.5 - 5.2 mmol/L Final  10/10/2012 4.1 3.5 - 5.1 mmol/L Final          Failed - Na in normal range and within 180 days    Sodium  Date Value Ref Range Status  11/07/2020 141 134 - 144 mmol/L Final  10/10/2012 138 136 - 145 mmol/L Final          Failed - Cr in normal range and within 180 days    Creatinine  Date Value Ref Range Status  10/10/2012 0.79 0.60 - 1.30 mg/dL Final   Creatinine, Ser  Date Value Ref Range Status  11/07/2020 0.76 0.57 - 1.00 mg/dL Final    Comment:                   **Effective November 10, 2020 [facility] will begin**                  reporting the 2021 CKD-EPI creatinine equation that                  estimates kidney function without a race variable.           Failed - Ca in normal range and within 180 days    Calcium  Date Value Ref Range Status  11/07/2020 10.9 (H) 8.7 - 10.3 mg/dL Final   Calcium, Total  Date Value Ref Range Status  10/10/2012 9.6 8.5 - 10.1 mg/dL Final          Passed - Patient is not pregnant      Passed - Last BP in normal range    BP Readings from Last 1 Encounters:  11/07/20 131/80          Passed - Valid encounter within last 6 months    Recent Outpatient Visits           6 months ago Benign hypertensive renal disease   [facility] [place], Megan P, DO   1  year ago Routine general medical examination at a health care facility   [facility], [place] P, DO   1 year ago Urinary tract infection without hematuria, site unspecified   [facility] [place], Salley Hews, [place]   1 year ago Avitaminosis D   [facility] [place], [place], DO   2 years ago Routine general medical examination at a health care facility   [facility] [place], Oralia Rud, DO       Future Appointments             In 2 days Laural Benes, Oralia Rud, DO [facility]   In 9 months  [facility]            Signed Prescriptions Disp Refills   amLODipine (NORVASC) 5  MG tablet 90 tablet 0    Sig: TAKE 1 TABLET BY MOUTH  DAILY     Cardiovascular:  Calcium Channel Blockers Passed - 05/19/2021  7:15 AM      Passed - Last BP in normal range    BP Readings from Last 1 Encounters:  11/07/20 131/80          Passed - Valid encounter within last 6 months    Recent Outpatient Visits           6 months ago Benign hypertensive renal disease   [facility] [place], Megan P, DO   1 year ago Routine general medical examination at a health care facility   [facility] [place], [place] [facility], [place]   1 year ago Urinary tract infection without hematuria, site unspecified   [facility] [place], Salley [place], [place]   1 year ago Avitaminosis D   [facility] [place], [place], DO   2 years ago Routine general medical examination at a health care facility   [facility] [place], [place] P, DO       Future Appointments             In 2 days Laural BenesJohnson, Megan P, DO [facility]   In 9 months  [facility]             metoprolol succinate (TOPROL-XL) 100 MG 24 hr tablet 90 tablet 0    Sig: TAKE 1 TABLET BY MOUTH  DAILY WITH OR IMMEDIATELY  FOLLOWING A MEAL     Cardiovascular:  Beta Blockers Passed - 05/19/2021  7:15  AM      Passed - Last BP in normal range    BP Readings from Last 1 Encounters:  11/07/20 131/80          Passed - Last Heart Rate in normal range    Pulse Readings from Last 1 Encounters:  11/07/20 (!) 57          Passed - Valid encounter within last 6 months    Recent Outpatient Visits           6 months ago Benign hypertensive renal disease   [facility] [place], Megan P, DO   1 year ago Routine general medical examination at a health care facility   [facility] [place], [place], DO   1 year ago Urinary tract infection without hematuria, site unspecified   [facility] [place], Salley [place], [place]   1 year ago Avitaminosis D   [facility] [place], [place] P, DO   2 years ago Routine general medical examination at a health care facility   [facility] [place], [place], DO       Future Appointments             In 2 days Laural BenesJohnson, Megan P, DO [facility]   In 9 months  [facility]             atorvastatin (LIPITOR) 20 MG tablet 90 tablet 2    Sig: TAKE 1 TABLET BY MOUTH  DAILY     Cardiovascular:  Antilipid - Statins Failed - 05/19/2021  7:15 AM      Failed - Triglycerides in normal range and within 360 days    Triglycerides  Date Value Ref Range Status  11/07/2020 152 (H) 0 - 149 mg/dL Final  65/78/469601/28/2014 295191 0 - 200 mg/dL Final  Triglycerides Piccolo,Waived  Date Value Ref Range Status  02/24/2016 157 (H) <150 mg/dL Final    Comment:                            Normal                   <150                         Borderline High     150 - 199                         High                200 - 499                         Very High                >499           Passed - Total Cholesterol in normal range and within 360 days    Cholesterol, Total  Date Value Ref Range Status  11/07/2020 165 100 - 199 mg/dL Final   Cholesterol  Date Value Ref Range Status   10/10/2012 175 0 - 200 mg/dL Final   Cholesterol Piccolo, Waived  Date Value Ref Range Status  02/24/2016 158 <200 mg/dL Final    Comment:                            Desirable                <200                         Borderline High      200- 239                         High                     >239           Passed - LDL in normal range and within 360 days    Ldl Cholesterol, Calc  Date Value Ref Range Status  10/10/2012 88 0 - 100 mg/dL Final   LDL Chol Calc (NIH)  Date Value Ref Range Status  11/07/2020 86 0 - 99 mg/dL Final          Passed - HDL in normal range and within 360 days    HDL Cholesterol  Date Value Ref Range Status  10/10/2012 49 40 - 60 mg/dL Final   HDL  Date Value Ref Range Status  11/07/2020 53 >39 mg/dL Final          Passed - Patient is not pregnant      Passed - Valid encounter within last 12 months    Recent Outpatient Visits           6 months ago Benign hypertensive renal disease   [facility] [place], Megan P, DO   1 year ago Routine general medical examination at a health care facility   [facility], [place] P, DO   1 year ago Urinary tract infection without hematuria, site unspecified   [facility],  Salley Hews, PA-C   1 year ago Avitaminosis D   [facility] [place], [place], DO   2 years ago Routine general medical examination at a health care facility   [facility] [place], Oralia Rud, DO       Future Appointments             In 2 days Laural Benes, Oralia Rud, DO [facility]   In 9 months  [facility]

## 2021-05-21 ENCOUNTER — Other Ambulatory Visit: Payer: Self-pay

## 2021-05-21 ENCOUNTER — Encounter: Payer: Self-pay | Admitting: Family Medicine

## 2021-05-21 ENCOUNTER — Ambulatory Visit (INDEPENDENT_AMBULATORY_CARE_PROVIDER_SITE_OTHER): Payer: Medicare Other | Admitting: Family Medicine

## 2021-05-21 VITALS — BP 140/75 | HR 68 | Temp 98.1°F | Ht 61.81 in | Wt 218.6 lb

## 2021-05-21 DIAGNOSIS — J301 Allergic rhinitis due to pollen: Secondary | ICD-10-CM

## 2021-05-21 DIAGNOSIS — Z0000 Encounter for general adult medical examination without abnormal findings: Secondary | ICD-10-CM

## 2021-05-21 DIAGNOSIS — M8580 Other specified disorders of bone density and structure, unspecified site: Secondary | ICD-10-CM

## 2021-05-21 DIAGNOSIS — Z87898 Personal history of other specified conditions: Secondary | ICD-10-CM | POA: Diagnosis not present

## 2021-05-21 DIAGNOSIS — E559 Vitamin D deficiency, unspecified: Secondary | ICD-10-CM | POA: Diagnosis not present

## 2021-05-21 DIAGNOSIS — I129 Hypertensive chronic kidney disease with stage 1 through stage 4 chronic kidney disease, or unspecified chronic kidney disease: Secondary | ICD-10-CM

## 2021-05-21 DIAGNOSIS — N3281 Overactive bladder: Secondary | ICD-10-CM

## 2021-05-21 DIAGNOSIS — E7800 Pure hypercholesterolemia, unspecified: Secondary | ICD-10-CM

## 2021-05-21 DIAGNOSIS — F325 Major depressive disorder, single episode, in full remission: Secondary | ICD-10-CM

## 2021-05-21 LAB — MICROSCOPIC EXAMINATION
Bacteria, UA: NONE SEEN
RBC, Urine: NONE SEEN /hpf (ref 0–2)

## 2021-05-21 LAB — URINALYSIS, ROUTINE W REFLEX MICROSCOPIC
Bilirubin, UA: NEGATIVE
Glucose, UA: NEGATIVE
Ketones, UA: NEGATIVE
Nitrite, UA: NEGATIVE
Protein,UA: NEGATIVE
RBC, UA: NEGATIVE
Specific Gravity, UA: 1.025 (ref 1.005–1.030)
Urobilinogen, Ur: 0.2 mg/dL (ref 0.2–1.0)
pH, UA: 7 (ref 5.0–7.5)

## 2021-05-21 LAB — MICROALBUMIN, URINE WAIVED
Creatinine, Urine Waived: 200 mg/dL (ref 10–300)
Microalb, Ur Waived: 10 mg/L (ref 0–19)
Microalb/Creat Ratio: <30 mg/g (ref <30)

## 2021-05-21 MED ORDER — VALSARTAN-HYDROCHLOROTHIAZIDE 160-25 MG PO TABS: 1.0000 | ORAL_TABLET | Freq: Every day | ORAL | 1 refills | Status: DC

## 2021-05-21 MED ORDER — FLUTICASONE PROPIONATE 50 MCG/ACT NA SUSP: 2.0000 | Freq: Every day | NASAL | 3 refills | Status: DC

## 2021-05-21 MED ORDER — ATORVASTATIN CALCIUM 20 MG PO TABS: 20.0000 mg | ORAL_TABLET | Freq: Every day | ORAL | 2 refills | Status: DC

## 2021-05-21 MED ORDER — MIRABEGRON ER 50 MG PO TB24: 50.0000 mg | ORAL_TABLET | Freq: Every day | ORAL | 3 refills | Status: DC

## 2021-05-21 MED ORDER — AMLODIPINE BESYLATE 5 MG PO TABS: 5.0000 mg | ORAL_TABLET | Freq: Every day | ORAL | 1 refills | Status: DC

## 2021-05-21 MED ORDER — METOPROLOL SUCCINATE ER 100 MG PO TB24: ORAL_TABLET | ORAL | 1 refills | Status: DC

## 2021-05-21 NOTE — Assessment & Plan Note (Signed)
Rechecking labs today. Await results. Treat as needed.  °

## 2021-05-21 NOTE — Assessment & Plan Note (Signed)
No concerns. Feeling well. Not on medicine. Call with any concerns.

## 2021-05-21 NOTE — Assessment & Plan Note (Signed)
Under good control on current regimen. Continue current regimen. Continue to monitor. Call with any concerns. Refills given. Labs drawn today.   

## 2021-05-21 NOTE — Progress Notes (Signed)
BP 140/75   Pulse 68   Temp 98.1 F (36.7 C) (Oral)   Ht 5' 1.81" (1.57 m)   Wt 218 lb 9.6 oz (99.2 kg)   LMP  (LMP Unknown)   SpO2 98%   BMI 40.23 kg/m    Subjective:    Patient ID: Anne Bell, female    DOB: 1951/03/20, 70 y.o.   MRN: [id number]  HPI: Anne Bell is a 70 y.o. female presenting on 05/21/2021 for comprehensive medical examination. Current medical complaints include:  URINARY SYMPTOMS Duration: 3 days- better today Dysuria: no Urinary frequency: no Urgency: no Small volume voids: no Symptom severity: no Urinary incontinence: no Foul odor: no Hematuria: no Abdominal pain: no Back pain: no Suprapubic pain/pressure: no Flank pain: no Fever:  no Vomiting: no Relief with cranberry juice: yes Relief with pyridium: no Status: better Previous urinary tract infection: yes Sexual activity: No sexually active History of sexually transmitted disease: no Vaginal discharge: no Treatments attempted: increasing fluids   HYPERTENSION / HYPERLIPIDEMIA Satisfied with current treatment? yes Duration of hypertension: chronic BP monitoring frequency: not checking BP medication side effects: no Past BP meds: amlodipine, valsartan-HCTZ Duration of hyperlipidemia: chronic Cholesterol medication side effects: no Cholesterol supplements: none Past cholesterol medications: atorvastatin Medication compliance: excellent compliance Aspirin: no Recent stressors: no Recurrent headaches: no Visual changes: no Palpitations: no Dyspnea: no Chest pain: no Lower extremity edema: no Dizzy/lightheaded: no  Menopausal Symptoms: no  Depression Screen done today and results listed below:  Depression screen [facility] 2/9 05/21/2021 02/16/2021 11/07/2020 05/07/2020 02/13/2020  Decreased Interest 0 0 0 0 0  Down, Depressed, Hopeless 0 0 0 0 0  PHQ - 2 Score 0 0 0 0 0  Altered sleeping 0 - 1 0 -  Tired, decreased energy 0 - 0 0 -  Change in appetite 0 - 0 0 -  Feeling bad or  failure about yourself  0 - 0 0 -  Trouble concentrating 0 - 0 0 -  Moving slowly or fidgety/restless 0 - 0 0 -  Suicidal thoughts 0 - 0 0 -  PHQ-9 Score 0 - 1 0 -  Difficult doing work/chores - - Not difficult at all Not difficult at all -  Some recent data might be hidden     Past Medical History:  Past Medical History:  Diagnosis Date   Allergy    Depression    Hyperlipidemia    Hypertension     Surgical History:  Past Surgical History:  Procedure Laterality Date   BREAST CYST ASPIRATION  1970's   ? side   PLANTAR FASCIA SURGERY Left 2008    Medications:  Current Outpatient Medications on File Prior to Visit  Medication Sig   Lutein 6 MG CAPS Take 6 mg by mouth daily.    MULTIPLE VITAMIN PO Take 1 tablet by mouth daily.    Magnesium 500 MG CAPS Take by mouth. (Patient not taking: No sig reported)   No current facility-administered medications on file prior to visit.    Allergies:  Allergies  Allergen Reactions   Celecoxib     stomach cramps   Prednisone Other (See Comments)   Sulfa Antibiotics     Unknown allergy    Social History:  Social History   Socioeconomic History   Marital status: Divorced    Spouse name: Not on file   Number of children: Not on file   Years of education: Not on file   Highest education level: Some  college, no degree  Occupational History   Occupation: Retired  Tobacco Use   Smoking status: Never   Smokeless tobacco: Never  Vaping Use   Vaping Use: Never used  Substance and Sexual Activity   Alcohol use: Yes    Comment: occasional   Drug use: No   Sexual activity: Yes    Birth control/protection: None  Other Topics Concern   Not on file  Social History Narrative   Not on file   Social Determinants of Health   Financial Resource Strain: Low Risk    Difficulty of Paying Living Expenses: Not hard at all  Food Insecurity: No Food Insecurity   Worried About Programme researcher, broadcasting/film/videounning Out of Food in the Last Year: Never true   Ran Out  of Food in the Last Year: Never true  Transportation Needs: No Transportation Needs   Lack of Transportation (Medical): No   Lack of Transportation (Non-Medical): No  Physical Activity: Inactive   Days of Exercise per Week: 0 days   Minutes of Exercise per Session: 0 min  Stress: No Stress Concern Present   Feeling of Stress : Not at all  Social Connections: Not on file  Intimate Partner Violence: Not on file   Social History   Tobacco Use  Smoking Status Never  Smokeless Tobacco Never   Social History   Substance and Sexual Activity  Alcohol Use Yes   Comment: occasional    Family History:  Family History  Problem Relation Age of Onset   Alcohol abuse Father    Mental illness Mother        Depression   Cancer Mother        started in back    Brain cancer Sister    Breast cancer Neg Hx     Past medical history, surgical history, medications, allergies, family history and social history reviewed with patient today and changes made to appropriate areas of the chart.   Review of Systems  Constitutional: Negative.   HENT:  Positive for sore throat. Negative for congestion, ear discharge, ear pain, hearing loss, nosebleeds, sinus pain and tinnitus.   Eyes: Negative.   Respiratory: Negative.  Negative for stridor.   Cardiovascular: Negative.   Gastrointestinal: Negative.   Genitourinary: Negative.   Musculoskeletal: Negative.   Skin: Negative.   Neurological: Negative.   Endo/Heme/Allergies: Negative.   Psychiatric/Behavioral: Negative.    All other ROS negative except what is listed above and in the HPI.      Objective:    BP 140/75   Pulse 68   Temp 98.1 F (36.7 C) (Oral)   Ht 5' 1.81" (1.57 m)   Wt 218 lb 9.6 oz (99.2 kg)   LMP  (LMP Unknown)   SpO2 98%   BMI 40.23 kg/m   Wt Readings from Last 3 Encounters:  05/21/21 218 lb 9.6 oz (99.2 kg)  02/16/21 220 lb (99.8 kg)  11/07/20 222 lb 9.6 oz (101 kg)    Physical Exam Vitals and nursing note  reviewed.  Constitutional:      General: She is not in acute distress.    Appearance: Normal appearance. She is not ill-appearing, toxic-appearing or diaphoretic.  HENT:     Head: Normocephalic and atraumatic.     Right Ear: Tympanic membrane, ear canal and external ear normal. There is no impacted cerumen.     Left Ear: Tympanic membrane, ear canal and external ear normal. There is no impacted cerumen.     Nose: Nose normal. No  congestion or rhinorrhea.     Mouth/Throat:     Mouth: Mucous membranes are moist.     Pharynx: Oropharynx is clear. No oropharyngeal exudate or posterior oropharyngeal erythema.  Eyes:     General: No scleral icterus.       Right eye: No discharge.        Left eye: No discharge.     Extraocular Movements: Extraocular movements intact.     Conjunctiva/sclera: Conjunctivae normal.     Pupils: Pupils are equal, round, and reactive to light.  Neck:     Vascular: No carotid bruit.  Cardiovascular:     Rate and Rhythm: Normal rate and regular rhythm.     Pulses: Normal pulses.     Heart sounds: No murmur heard.   No friction rub. No gallop.  Pulmonary:     Effort: Pulmonary effort is normal. No respiratory distress.     Breath sounds: Normal breath sounds. No stridor. No wheezing, rhonchi or rales.  Chest:     Chest wall: No tenderness.  Abdominal:     General: Abdomen is flat. Bowel sounds are normal. There is no distension.     Palpations: Abdomen is soft. There is no mass.     Tenderness: There is no abdominal tenderness. There is no right CVA tenderness, left CVA tenderness, guarding or rebound.     Hernia: No hernia is present.  Genitourinary:    Comments: Breast and pelvic exams deferred with shared decision making Musculoskeletal:        General: No swelling, tenderness, deformity or signs of injury.     Cervical back: Normal range of motion and neck supple. No rigidity. No muscular tenderness.     Right lower leg: No edema.     Left lower leg: No  edema.  Lymphadenopathy:     Cervical: No cervical adenopathy.  Skin:    General: Skin is warm and dry.     Capillary Refill: Capillary refill takes less than 2 seconds.     Coloration: Skin is not jaundiced or pale.     Findings: No bruising, erythema, lesion or rash.  Neurological:     General: No focal deficit present.     Mental Status: She is alert and oriented to person, place, and time. Mental status is at baseline.     Cranial Nerves: No cranial nerve deficit.     Sensory: No sensory deficit.     Motor: No weakness.     Coordination: Coordination normal.     Gait: Gait normal.     Deep Tendon Reflexes: Reflexes normal.  Psychiatric:        Mood and Affect: Mood normal.        Behavior: Behavior normal.        Thought Content: Thought content normal.        Judgment: Judgment normal.    Results for orders placed or performed in visit on 11/07/20  CBC with Differential/Platelet  Result Value Ref Range   WBC 7.9 3.4 - 10.8 x10E3/uL   RBC 4.79 3.77 - 5.28 x10E6/uL   Hemoglobin 14.8 11.1 - 15.9 g/dL   Hematocrit 71.2 45.8 - 46.6 %   MCV 93 79 - 97 fL   MCH 30.9 26.6 - 33.0 pg   MCHC 33.3 31.5 - 35.7 g/dL   RDW 09.9 83.3 - 82.5 %   Platelets 213 150 - 450 x10E3/uL   Neutrophils 76 Not Estab. %   Lymphs 14 Not Estab. %  Monocytes 7 Not Estab. %   Eos 2 Not Estab. %   Basos 1 Not Estab. %   Neutrophils Absolute 6.0 1.4 - 7.0 x10E3/uL   Lymphocytes Absolute 1.1 0.7 - 3.1 x10E3/uL   Monocytes Absolute 0.5 0.1 - 0.9 x10E3/uL   EOS (ABSOLUTE) 0.2 0.0 - 0.4 x10E3/uL   Basophils Absolute 0.1 0.0 - 0.2 x10E3/uL   Immature Granulocytes 0 Not Estab. %   Immature Grans (Abs) 0.0 0.0 - 0.1 x10E3/uL  Comprehensive metabolic panel  Result Value Ref Range   Glucose 93 65 - 99 mg/dL   BUN 18 8 - 27 mg/dL   Creatinine, Ser 4.40 0.57 - 1.00 mg/dL   GFR calc non Af Amer 80 >59 mL/min/1.73   GFR calc Af Amer 92 >59 mL/min/1.73   BUN/Creatinine Ratio 24 12 - 28   Sodium 141 134 -  144 mmol/L   Potassium 4.4 3.5 - 5.2 mmol/L   Chloride 100 96 - 106 mmol/L   CO2 25 20 - 29 mmol/L   Calcium 10.9 (H) 8.7 - 10.3 mg/dL   Total Protein 7.4 6.0 - 8.5 g/dL   Albumin 4.7 3.8 - 4.8 g/dL   Globulin, Total 2.7 1.5 - 4.5 g/dL   Albumin/Globulin Ratio 1.7 1.2 - 2.2   Bilirubin Total 0.7 0.0 - 1.2 mg/dL   Alkaline Phosphatase 92 44 - 121 IU/L   AST 25 0 - 40 IU/L   ALT 23 0 - 32 IU/L  Lipid Panel w/o Chol/HDL Ratio  Result Value Ref Range   Cholesterol, Total 165 100 - 199 mg/dL   Triglycerides 347 (H) 0 - 149 mg/dL   HDL 53 >42 mg/dL   VLDL Cholesterol Cal 26 5 - 40 mg/dL   LDL Chol Calc (NIH) 86 0 - 99 mg/dL  VITAMIN D 25 Hydroxy (Vit-D Deficiency, Fractures)  Result Value Ref Range   Vit D, 25-Hydroxy 31.3 30.0 - 100.0 ng/mL      Assessment & Plan:   Problem List Items Addressed This Visit       Respiratory   Allergic rhinitis   Relevant Medications   fluticasone (FLONASE) 50 MCG/ACT nasal spray     Musculoskeletal and Integument   Osteopenia    Will recheck her DEXA in March.       Relevant Orders   CBC with Differential/Platelet   Comprehensive metabolic panel   VITAMIN D 25 Hydroxy (Vit-D Deficiency, Fractures)     Genitourinary   Benign hypertensive renal disease    Under good control on current regimen. Continue current regimen. Continue to monitor. Call with any concerns. Refills given. Labs drawn today.      Relevant Medications   valsartan-hydrochlorothiazide (DIOVAN-HCT) 160-25 MG tablet   metoprolol succinate (TOPROL-XL) 100 MG 24 hr tablet   amLODipine (NORVASC) 5 MG tablet   Other Relevant Orders   CBC with Differential/Platelet   Comprehensive metabolic panel   Microalbumin, Urine Waived   Overactive bladder    Under good control on current regimen. Continue current regimen. Continue to monitor. Call with any concerns. Refills given. Labs drawn today.       Relevant Orders   CBC with Differential/Platelet   Comprehensive  metabolic panel   Urinalysis, Routine w reflex microscopic     Other   Major depression in remission (HCC)    No concerns. Feeling well. Not on medicine. Call with any concerns.       Relevant Orders   CBC with Differential/Platelet   Comprehensive metabolic  panel   TSH   Hypercholesteremia    Under good control on current regimen. Continue current regimen. Continue to monitor. Call with any concerns. Refills given. Labs drawn today.      Relevant Medications   valsartan-hydrochlorothiazide (DIOVAN-HCT) 160-25 MG tablet   metoprolol succinate (TOPROL-XL) 100 MG 24 hr tablet   atorvastatin (LIPITOR) 20 MG tablet   amLODipine (NORVASC) 5 MG tablet   Other Relevant Orders   CBC with Differential/Platelet   Comprehensive metabolic panel   Lipid Panel w/o Chol/HDL Ratio   Avitaminosis D    Rechecking labs today. Await results. Treat as needed.       Relevant Orders   CBC with Differential/Platelet   Comprehensive metabolic panel   VITAMIN D 25 Hydroxy (Vit-D Deficiency, Fractures)   Other Visit Diagnoses     Routine general medical examination at a health care facility    -  Primary   Vaccines up to date. Screening labs checked today. Pap N/A. Mammo and DEXA in March. Cologuard in December. Continue diet and exercise. Call with any concerns.   History of dysuria       Await urine. Treat as needed.         Follow up plan: Return in about 6 months (around 11/18/2021).   LABORATORY TESTING:  - Pap smear: not applicable  IMMUNIZATIONS:   - Tdap: Tetanus vaccination status reviewed: last tetanus booster within 10 years. - Influenza: Postponed to flu season - Pneumovax: Up to date - Prevnar: Up to date - COVID: Up to date - Shingrix vaccine: Refused  SCREENING: -Mammogram: Up to date  - Colonoscopy:  Cologuard due in December   - Bone Density: Up to date   PATIENT COUNSELING:   Advised to take 1 mg of folate supplement per day if capable of pregnancy.    Sexuality: Discussed sexually transmitted diseases, partner selection, use of condoms, avoidance of unintended pregnancy  and contraceptive alternatives.   Advised to avoid cigarette smoking.  I discussed with the patient that most people either abstain from alcohol or drink within safe limits (<=14/week and <=4 drinks/occasion for males, <=7/weeks and <= 3 drinks/occasion for females) and that the risk for alcohol disorders and other health effects rises proportionally with the number of drinks per week and how often a drinker exceeds daily limits.  Discussed cessation/primary prevention of drug use and availability of treatment for abuse.   Diet: Encouraged to adjust caloric intake to maintain  or achieve ideal body weight, to reduce intake of dietary saturated fat and total fat, to limit sodium intake by avoiding high sodium foods and not adding table salt, and to maintain adequate dietary potassium and calcium preferably from fresh fruits, vegetables, and low-fat dairy products.    stressed the importance of regular exercise  Injury prevention: Discussed safety belts, safety helmets, smoke detector, smoking near bedding or upholstery.   Dental health: Discussed importance of regular tooth brushing, flossing, and dental visits.    NEXT PREVENTATIVE PHYSICAL DUE IN 1 YEAR. Return in about 6 months (around 11/18/2021).

## 2021-05-21 NOTE — Assessment & Plan Note (Signed)
Will recheck her DEXA in March.

## 2021-05-22 ENCOUNTER — Encounter: Payer: Self-pay | Admitting: Family Medicine

## 2021-05-22 LAB — COMPREHENSIVE METABOLIC PANEL
ALT: 18 IU/L (ref 0–32)
AST: 19 IU/L (ref 0–40)
Albumin/Globulin Ratio: 1.9 (ref 1.2–2.2)
Albumin: 5 g/dL — ABNORMAL HIGH (ref 3.7–4.7)
Alkaline Phosphatase: 99 IU/L (ref 44–121)
BUN/Creatinine Ratio: 21 (ref 12–28)
BUN: 18 mg/dL (ref 8–27)
Bilirubin Total: 0.7 mg/dL (ref 0.0–1.2)
CO2: 25 mmol/L (ref 20–29)
Calcium: 11.2 mg/dL — ABNORMAL HIGH (ref 8.7–10.3)
Chloride: 98 mmol/L (ref 96–106)
Creatinine, Ser: 0.85 mg/dL (ref 0.57–1.00)
Globulin, Total: 2.6 g/dL (ref 1.5–4.5)
Glucose: 102 mg/dL — ABNORMAL HIGH (ref 65–99)
Potassium: 3.9 mmol/L (ref 3.5–5.2)
Sodium: 141 mmol/L (ref 134–144)
Total Protein: 7.6 g/dL (ref 6.0–8.5)
eGFR: 73 mL/min/{1.73_m2} (ref >59)

## 2021-05-22 LAB — VITAMIN D 25 HYDROXY (VIT D DEFICIENCY, FRACTURES): Vit D, 25-Hydroxy: 37.8 ng/mL (ref 30.0–100.0)

## 2021-05-22 LAB — CBC WITH DIFFERENTIAL/PLATELET
Basophils Absolute: 0 10*3/uL (ref 0.0–0.2)
Basos: 1 %
EOS (ABSOLUTE): 0.3 10*3/uL (ref 0.0–0.4)
Eos: 3 %
Hematocrit: 43.2 % (ref 34.0–46.6)
Hemoglobin: 14.6 g/dL (ref 11.1–15.9)
Immature Grans (Abs): 0 10*3/uL (ref 0.0–0.1)
Immature Granulocytes: 0 %
Lymphocytes Absolute: 1.6 10*3/uL (ref 0.7–3.1)
Lymphs: 19 %
MCH: 31.2 pg (ref 26.6–33.0)
MCHC: 33.8 g/dL (ref 31.5–35.7)
MCV: 92 fL (ref 79–97)
Monocytes Absolute: 0.6 10*3/uL (ref 0.1–0.9)
Monocytes: 7 %
Neutrophils Absolute: 5.8 10*3/uL (ref 1.4–7.0)
Neutrophils: 70 %
Platelets: 228 10*3/uL (ref 150–450)
RBC: 4.68 x10E6/uL (ref 3.77–5.28)
RDW: 12.3 % (ref 11.7–15.4)
WBC: 8.4 10*3/uL (ref 3.4–10.8)

## 2021-05-22 LAB — LIPID PANEL W/O CHOL/HDL RATIO
Cholesterol, Total: 170 mg/dL (ref 100–199)
HDL: 60 mg/dL (ref >39)
LDL Chol Calc (NIH): 85 mg/dL (ref 0–99)
Triglycerides: 144 mg/dL (ref 0–149)
VLDL Cholesterol Cal: 25 mg/dL (ref 5–40)

## 2021-05-22 LAB — TSH: TSH: 0.588 u[IU]/mL (ref 0.450–4.500)

## 2021-05-26 ENCOUNTER — Other Ambulatory Visit: Payer: Self-pay

## 2021-05-26 DIAGNOSIS — I129 Hypertensive chronic kidney disease with stage 1 through stage 4 chronic kidney disease, or unspecified chronic kidney disease: Secondary | ICD-10-CM

## 2021-05-26 MED ORDER — METOPROLOL SUCCINATE ER 100 MG PO TB24: ORAL_TABLET | ORAL | 1 refills | Status: DC

## 2021-05-26 NOTE — Telephone Encounter (Signed)
Updated RX directions. Please resend.

## 2021-06-01 ENCOUNTER — Other Ambulatory Visit: Payer: Self-pay | Admitting: Family Medicine

## 2021-06-01 DIAGNOSIS — I129 Hypertensive chronic kidney disease with stage 1 through stage 4 chronic kidney disease, or unspecified chronic kidney disease: Secondary | ICD-10-CM

## 2021-06-01 MED ORDER — METOPROLOL SUCCINATE ER 100 MG PO TB24: ORAL_TABLET | ORAL | 1 refills | Status: DC

## 2021-07-29 ENCOUNTER — Telehealth: Payer: Self-pay | Admitting: Family Medicine

## 2021-07-29 NOTE — Telephone Encounter (Signed)
Patient called back and would like a 90 day supply sent to   [facility]) - [place], [place] - [address] [facility]  [address], [place] [address]  Phone:  [phone]  Fax:  [phone]   Instead of [facility]

## 2021-07-29 NOTE — Telephone Encounter (Signed)
Patient states mirabegron ER (MYRBETRIQ) 50 MG TB24 tablet cost to much and would like to start taking oxybutynin twice daily. Patient would like a follow up call         [facility] [address] [place], [place], [place] [address] [phone]

## 2021-07-29 NOTE — Telephone Encounter (Signed)
Routing to provider. Can RX be changed per patient request and sent to [facility]?

## 2021-07-30 MED ORDER — OXYBUTYNIN CHLORIDE ER 10 MG PO TB24: 10.0000 mg | ORAL_TABLET | Freq: Every day | ORAL | 1 refills | Status: DC

## 2021-08-20 ENCOUNTER — Telehealth: Payer: Self-pay | Admitting: Family Medicine

## 2021-08-20 DIAGNOSIS — Z1211 Encounter for screening for malignant neoplasm of colon: Secondary | ICD-10-CM

## 2021-08-20 NOTE — Telephone Encounter (Signed)
-----   Message from Dorcas Carrow, DO sent at 05/21/2021 10:37 AM EDT ----- Cologuard due

## 2021-09-09 DIAGNOSIS — Z1211 Encounter for screening for malignant neoplasm of colon: Secondary | ICD-10-CM | POA: Diagnosis not present

## 2021-09-18 LAB — COLOGUARD: COLOGUARD: NEGATIVE

## 2021-09-19 ENCOUNTER — Encounter: Payer: Self-pay | Admitting: Family Medicine

## 2021-10-12 ENCOUNTER — Telehealth: Payer: Self-pay | Admitting: Family Medicine

## 2021-10-12 ENCOUNTER — Other Ambulatory Visit: Payer: Self-pay | Admitting: Family Medicine

## 2021-10-12 DIAGNOSIS — Z13820 Encounter for screening for osteoporosis: Secondary | ICD-10-CM

## 2021-10-12 NOTE — Telephone Encounter (Signed)
Order in.

## 2021-10-12 NOTE — Telephone Encounter (Signed)
[facility] called in stating orders were sent over for pt a mammo, but they did not get orders for pt to get Bone Density screening(Dexa), she is requesting if the orders could be sent over, please advise.

## 2021-10-12 NOTE — Telephone Encounter (Signed)
Copied from CRM [id number]. Topic: General - Other >> Oct 12, 2021 10:03 AM Anne Bell wrote: Reason for CRM: pt called in to request to have orders placed so that she can have her bone density test completed.

## 2021-10-12 NOTE — Telephone Encounter (Signed)
Spoke with patient and informed her that Dr.Johnson ordered her Bone Density. Patient was notified that once the order is received, they would reach out to the patient and get her scheduled. Patient verbalized understanding and has no further questions at this time.

## 2021-10-12 NOTE — Telephone Encounter (Signed)
Pt was given the date that she had her last bone denesity test. Pt states that she will call back after speaking with her insurance company to place an order for a test.

## 2021-10-12 NOTE — Telephone Encounter (Signed)
Pt is calling to ask when was the last time that she had a bone denesity test. To determine if her insurance will pay for it. CB- [phone]

## 2021-10-13 ENCOUNTER — Other Ambulatory Visit: Payer: Self-pay | Admitting: Family Medicine

## 2021-10-13 DIAGNOSIS — Z1231 Encounter for screening mammogram for malignant neoplasm of breast: Secondary | ICD-10-CM

## 2021-10-21 ENCOUNTER — Other Ambulatory Visit: Payer: Self-pay | Admitting: Family Medicine

## 2021-10-21 DIAGNOSIS — I129 Hypertensive chronic kidney disease with stage 1 through stage 4 chronic kidney disease, or unspecified chronic kidney disease: Secondary | ICD-10-CM

## 2021-10-21 NOTE — Telephone Encounter (Signed)
Requested Prescriptions  Pending Prescriptions Disp Refills   amLODipine (NORVASC) 5 MG tablet [Pharmacy Med Name: amLODIPine Besylate 5 MG Oral Tablet] 90 tablet 0    Sig: TAKE 1 TABLET BY MOUTH  DAILY     Cardiovascular: Calcium Channel Blockers 2 Failed - 10/21/2021  6:48 AM      Failed - Last BP in normal range    BP Readings from Last 1 Encounters:  05/21/21 140/75         Passed - Last Heart Rate in normal range    Pulse Readings from Last 1 Encounters:  05/21/21 68         Passed - Valid encounter within last 6 months    Recent Outpatient Visits          5 months ago Routine general medical examination at a health care facility   [facility], [place], DO   11 months ago Benign hypertensive renal disease   [place], Megan P, DO   1 year ago Routine general medical examination at a health care facility   [facility], [place] P, DO   1 year ago Urinary tract infection without hematuria, site unspecified   [place], Lilia Argue, [place]   2 years ago Avitaminosis D   Alexander, [place], DO      Future Appointments            In 4 weeks Wynetta Emery, Barb Merino, DO [place], [place]   In 4 months  [employer], [place]

## 2021-11-19 ENCOUNTER — Ambulatory Visit
Admission: RE | Admit: 2021-11-19 | Discharge: 2021-11-19 | Disposition: A | Discharge status: Home / Self Care | Payer: Medicare Other | Source: Ambulatory Visit | Attending: Family Medicine | Admitting: Family Medicine

## 2021-11-19 ENCOUNTER — Ambulatory Visit: Payer: Medicare Other

## 2021-11-19 ENCOUNTER — Other Ambulatory Visit: Payer: Self-pay

## 2021-11-19 ENCOUNTER — Encounter: Payer: Self-pay | Admitting: Family Medicine

## 2021-11-19 ENCOUNTER — Ambulatory Visit
Admission: RE | Admit: 2021-11-19 | Discharge: 2021-11-19 | Disposition: A | Discharge status: Home / Self Care | Payer: Medicare Other | Source: Home / Self Care | Attending: Family Medicine | Admitting: Family Medicine

## 2021-11-19 ENCOUNTER — Ambulatory Visit (INDEPENDENT_AMBULATORY_CARE_PROVIDER_SITE_OTHER): Payer: Medicare Other | Admitting: Family Medicine

## 2021-11-19 VITALS — BP 134/78 | HR 56 | Temp 98.3°F | Wt 212.2 lb

## 2021-11-19 DIAGNOSIS — S0990XA Unspecified injury of head, initial encounter: Secondary | ICD-10-CM | POA: Diagnosis not present

## 2021-11-19 DIAGNOSIS — I700 Atherosclerosis of aorta: Secondary | ICD-10-CM | POA: Diagnosis not present

## 2021-11-19 DIAGNOSIS — E559 Vitamin D deficiency, unspecified: Secondary | ICD-10-CM | POA: Diagnosis not present

## 2021-11-19 DIAGNOSIS — X58XXXA Exposure to other specified factors, initial encounter: Secondary | ICD-10-CM | POA: Insufficient documentation

## 2021-11-19 DIAGNOSIS — N3000 Acute cystitis without hematuria: Secondary | ICD-10-CM

## 2021-11-19 DIAGNOSIS — Y929 Unspecified place or not applicable: Secondary | ICD-10-CM | POA: Insufficient documentation

## 2021-11-19 DIAGNOSIS — I129 Hypertensive chronic kidney disease with stage 1 through stage 4 chronic kidney disease, or unspecified chronic kidney disease: Secondary | ICD-10-CM | POA: Diagnosis not present

## 2021-11-19 DIAGNOSIS — M19041 Primary osteoarthritis, right hand: Secondary | ICD-10-CM | POA: Insufficient documentation

## 2021-11-19 DIAGNOSIS — M79641 Pain in right hand: Secondary | ICD-10-CM

## 2021-11-19 DIAGNOSIS — F325 Major depressive disorder, single episode, in full remission: Secondary | ICD-10-CM

## 2021-11-19 DIAGNOSIS — E7800 Pure hypercholesterolemia, unspecified: Secondary | ICD-10-CM | POA: Diagnosis not present

## 2021-11-19 DIAGNOSIS — H5711 Ocular pain, right eye: Secondary | ICD-10-CM | POA: Diagnosis not present

## 2021-11-19 DIAGNOSIS — R519 Headache, unspecified: Secondary | ICD-10-CM | POA: Diagnosis not present

## 2021-11-19 DIAGNOSIS — S0992XA Unspecified injury of nose, initial encounter: Secondary | ICD-10-CM | POA: Diagnosis not present

## 2021-11-19 DIAGNOSIS — S0993XA Unspecified injury of face, initial encounter: Secondary | ICD-10-CM | POA: Diagnosis not present

## 2021-11-19 DIAGNOSIS — W19XXXA Unspecified fall, initial encounter: Secondary | ICD-10-CM

## 2021-11-19 DIAGNOSIS — Y939 Activity, unspecified: Secondary | ICD-10-CM | POA: Insufficient documentation

## 2021-11-19 DIAGNOSIS — M7989 Other specified soft tissue disorders: Secondary | ICD-10-CM | POA: Diagnosis not present

## 2021-11-19 DIAGNOSIS — R6884 Jaw pain: Secondary | ICD-10-CM | POA: Diagnosis not present

## 2021-11-19 DIAGNOSIS — R300 Dysuria: Secondary | ICD-10-CM

## 2021-11-19 DIAGNOSIS — R220 Localized swelling, mass and lump, head: Secondary | ICD-10-CM | POA: Diagnosis not present

## 2021-11-19 LAB — URINALYSIS, ROUTINE W REFLEX MICROSCOPIC
Bilirubin, UA: NEGATIVE
Glucose, UA: NEGATIVE
Ketones, UA: NEGATIVE
Nitrite, UA: NEGATIVE
Specific Gravity, UA: 1.015 (ref 1.005–1.030)
Urobilinogen, Ur: 0.2 mg/dL (ref 0.2–1.0)
pH, UA: 7 (ref 5.0–7.5)

## 2021-11-19 LAB — MICROSCOPIC EXAMINATION

## 2021-11-19 MED ORDER — AMLODIPINE BESYLATE 5 MG PO TABS: 5.0000 mg | ORAL_TABLET | Freq: Every day | ORAL | 1 refills | Status: DC

## 2021-11-19 MED ORDER — NAPROXEN 500 MG PO TABS: 500.0000 mg | ORAL_TABLET | Freq: Two times a day (BID) | ORAL | 3 refills | Status: DC

## 2021-11-19 MED ORDER — METOPROLOL SUCCINATE ER 100 MG PO TB24: ORAL_TABLET | ORAL | 1 refills | Status: DC

## 2021-11-19 MED ORDER — NITROFURANTOIN MONOHYD MACRO 100 MG PO CAPS
100.0000 mg | ORAL_CAPSULE | Freq: Two times a day (BID) | ORAL | 0 refills | Status: DC
Start: 2021-11-19 — End: 2021-11-30

## 2021-11-19 MED ORDER — ATORVASTATIN CALCIUM 20 MG PO TABS: 20.0000 mg | ORAL_TABLET | Freq: Every day | ORAL | 2 refills | Status: DC

## 2021-11-19 MED ORDER — OXYBUTYNIN CHLORIDE ER 10 MG PO TB24: 10.0000 mg | ORAL_TABLET | Freq: Every day | ORAL | 1 refills | Status: DC

## 2021-11-19 MED ORDER — VALSARTAN-HYDROCHLOROTHIAZIDE 160-25 MG PO TABS: 1.0000 | ORAL_TABLET | Freq: Every day | ORAL | 1 refills | Status: DC

## 2021-11-19 NOTE — Assessment & Plan Note (Signed)
Rechecking labs today. Await results. Treat as needed.  °

## 2021-11-19 NOTE — Assessment & Plan Note (Signed)
Under good control on current regimen. Continue current regimen. Continue to monitor. Call with any concerns. Refills given. Labs drawn today.   

## 2021-11-19 NOTE — Assessment & Plan Note (Signed)
Doing well off medicine. Continue to monitor. Call with any concerns.  

## 2021-11-19 NOTE — Progress Notes (Signed)
? ?BP 134/78 (BP Location: Left Arm, Cuff Size: Normal)   Pulse (!) 56   Temp 98.3 ?F (36.8 ?C)   Wt 212 lb 3.2 oz (96.3 kg)   LMP  (LMP Unknown)   SpO2 96%   BMI 39.05 kg/m?   ? ?Subjective:  ? ? Patient ID: Anne Bell, female    DOB: 1951-04-01, 71 y.o.   MRN: [id number] ? ?HPI: ?Anne Bell is a 71 y.o. female ? ?Chief Complaint  ?Patient presents with  ? Hypertension  ? Depression  ? Hyperlipidemia  ? Fall  ?  Patient states she fell yesterday and hit her face and hurt her right hand. Patient has a right black eye, swollen face and swollen right hand.   ? Urinary Tract Infection  ?  Patient is having vaginal itching, urinary frequency, and cloudy urine. Patient used OTC vagisil but did not clear up yet.   ? ?Larey Seat, tripped over the uneven ground and fell onto her R hand and her face yesterday. No LOC. Her face and her R hand have been swollen and mildly tender ? ?URINARY SYMPTOMS ?Duration: few days ?Dysuria: yes ?Urinary frequency: yes ?Urgency: yes ?Small volume voids: yes ?Symptom severity: moderate ?Urinary incontinence: no ?Foul odor: yes ?Hematuria: no ?Abdominal pain: no ?Back pain: no ?Suprapubic pain/pressure: no ?Flank pain: no ?Fever:  no ?Vomiting: no ?Relief with cranberry juice: no ?Relief with pyridium: no ?Status: stable ?Previous urinary tract infection: yes ?Recurrent urinary tract infection: no ?History of sexually transmitted disease: no ?Vaginal discharge: no ?Treatments attempted: cranberry and increasing fluids  ? ?HYPERTENSION / HYPERLIPIDEMIA ?Satisfied with current treatment? yes ?Duration of hypertension: chronic ?BP monitoring frequency: not checking ?BP medication side effects: no ?Past BP meds: metoprolol, valsartan, HCTZ ?Duration of hyperlipidemia: chronic ?Cholesterol medication side effects: no ?Cholesterol supplements: none ?Past cholesterol medications: atorvastatin ?Medication compliance: excellent compliance ?Aspirin: no ?Recent stressors: no ?Recurrent headaches:  no ?Visual changes: no ?Palpitations: no ?Dyspnea: no ?Chest pain: no ?Lower extremity edema: no ?Dizzy/lightheaded: no ? ?Relevant past medical, surgical, family and social history reviewed and updated as indicated. Interim medical history since our last visit reviewed. ?Allergies and medications reviewed and updated. ? ?Review of Systems  ?Constitutional: Negative.   ?Respiratory: Negative.    ?Cardiovascular: Negative.   ?Gastrointestinal: Negative.   ?Musculoskeletal: Negative.   ?Psychiatric/Behavioral: Negative.    ? ?Per HPI unless specifically indicated above ? ?   ?Objective:  ?  ?BP 134/78 (BP Location: Left Arm, Cuff Size: Normal)   Pulse (!) 56   Temp 98.3 ?F (36.8 ?C)   Wt 212 lb 3.2 oz (96.3 kg)   LMP  (LMP Unknown)   SpO2 96%   BMI 39.05 kg/m?   ?Wt Readings from Last 3 Encounters:  ?11/19/21 212 lb 3.2 oz (96.3 kg)  ?05/21/21 218 lb 9.6 oz (99.2 kg)  ?02/16/21 220 lb (99.8 kg)  ?  ?Physical Exam ?Vitals and nursing note reviewed.  ?Constitutional:   ?   General: She is not in acute distress. ?   Appearance: Normal appearance. She is not ill-appearing, toxic-appearing or diaphoretic.  ?HENT:  ?   Head: Normocephalic and atraumatic.  ?   Right Ear: External ear normal.  ?   Left Ear: External ear normal.  ?   Nose: Nose normal.  ?   Mouth/Throat:  ?   Mouth: Mucous membranes are moist.  ?   Pharynx: Oropharynx is clear.  ?Eyes:  ?   General: No scleral icterus.    ?  Right eye: No discharge.     ?   Left eye: No discharge.  ?   Extraocular Movements: Extraocular movements intact.  ?   Conjunctiva/sclera: Conjunctivae normal.  ?   Pupils: Pupils are equal, round, and reactive to light.  ?Cardiovascular:  ?   Rate and Rhythm: Normal rate and regular rhythm.  ?   Pulses: Normal pulses.  ?   Heart sounds: Normal heart sounds. No murmur heard. ?  No friction rub. No gallop.  ?Pulmonary:  ?   Effort: Pulmonary effort is normal. No respiratory distress.  ?   Breath sounds: Normal breath sounds. No  stridor. No wheezing, rhonchi or rales.  ?Chest:  ?   Chest wall: No tenderness.  ?Musculoskeletal:     ?   General: Normal range of motion.  ?   Cervical back: Normal range of motion and neck supple.  ?Skin: ?   General: Skin is warm and dry.  ?   Capillary Refill: Capillary refill takes less than 2 seconds.  ?   Coloration: Skin is not jaundiced or pale.  ?   Findings: No bruising, erythema, lesion or rash.  ?   Comments: Bruising to her R eye and upper lip, tenderness to R orbit. R hand swollen and tender  ?Neurological:  ?   General: No focal deficit present.  ?   Mental Status: She is alert and oriented to person, place, and time. Mental status is at baseline.  ?Psychiatric:     ?   Mood and Affect: Mood normal.     ?   Behavior: Behavior normal.     ?   Thought Content: Thought content normal.     ?   Judgment: Judgment normal.  ? ? ?Results for orders placed or performed in visit on 11/19/21  ?Microscopic Examination  ? Urine  ?Result Value Ref Range  ? WBC, UA 6-10 (A) 0 - 5 /hpf  ? RBC 0-2 0 - 2 /hpf  ? Epithelial Cells (non renal) 0-10 0 - 10 /hpf  ? Mucus, UA Present (A) Not Estab.  ? Bacteria, UA Few (A) None seen/Few  ?Urinalysis, Routine w reflex microscopic  ?Result Value Ref Range  ? Specific Gravity, UA 1.015 1.005 - 1.030  ? pH, UA 7.0 5.0 - 7.5  ? Color, UA Yellow Yellow  ? Appearance Ur Cloudy (A) Clear  ? Leukocytes,UA 2+ (A) Negative  ? Protein,UA Trace (A) Negative/Trace  ? Glucose, UA Negative Negative  ? Ketones, UA Negative Negative  ? RBC, UA Trace (A) Negative  ? Bilirubin, UA Negative Negative  ? Urobilinogen, Ur 0.2 0.2 - 1.0 mg/dL  ? Nitrite, UA Negative Negative  ? Microscopic Examination See below:   ? ?   ?Assessment & Plan:  ? ?Problem List Items Addressed This Visit   ? ?  ? Genitourinary  ? Benign hypertensive renal disease - Primary  ?  Under good control on current regimen. Continue current regimen. Continue to monitor. Call with any concerns. Refills given. Labs drawn  today. ? ?  ?  ? Relevant Medications  ? amLODipine (NORVASC) 5 MG tablet  ? metoprolol succinate (TOPROL-XL) 100 MG 24 hr tablet  ? valsartan-hydrochlorothiazide (DIOVAN-HCT) 160-25 MG tablet  ? Other Relevant Orders  ? CBC with Differential/Platelet  ? Comprehensive metabolic panel  ?  ? Other  ? Major depression in remission [facility])  ?  Doing well off medicine. Continue to monitor. Call with any concerns.  ?  ?  ?  Hypercholesteremia  ?  Under good control on current regimen. Continue current regimen. Continue to monitor. Call with any concerns. Refills given. Labs drawn today. ? ?  ?  ? Relevant Medications  ? amLODipine (NORVASC) 5 MG tablet  ? atorvastatin (LIPITOR) 20 MG tablet  ? metoprolol succinate (TOPROL-XL) 100 MG 24 hr tablet  ? valsartan-hydrochlorothiazide (DIOVAN-HCT) 160-25 MG tablet  ? Other Relevant Orders  ? Lipid Panel w/o Chol/HDL Ratio  ? Avitaminosis D  ?  Rechecking labs today. Await results. Treat as needed.  ?  ?  ? Relevant Orders  ? VITAMIN D 25 Hydroxy (Vit-D Deficiency, Fractures)  ? ?Other Visit Diagnoses   ? ? Facial injury, initial encounter      ? Will check CT to r/o fracture or bleed. Await results.   ? Relevant Orders  ? CT HEAD WO CONTRAST ( ) (Completed)  ? CT ORBITS WO CONTRAST (Completed)  ? Fall, initial encounter      ? Mechanical fall. Will check CT to r/o fracture or bleed. Await results.   ? Relevant Orders  ? CT HEAD WO CONTRAST ( ) (Completed)  ? CT ORBITS WO CONTRAST (Completed)  ? Right hand pain      ? Will check x-ray to r/o fracture. Await results.   ? Relevant Orders  ? DG Hand Complete Right  ? Acute cystitis without hematuria      ? Will treat with macrobid. Await results. Treat as needed.   ? Relevant Orders  ? Urine Culture  ? Dysuria      ? +UTI  ? Relevant Orders  ? Urinalysis, Routine w reflex microscopic (Completed)  ? ?  ?  ? ?Follow up plan: ?Return 2-3 week, follow up fall. ? ? ? ? ? ?

## 2021-11-20 ENCOUNTER — Encounter: Payer: Self-pay | Admitting: Family Medicine

## 2021-11-20 LAB — LIPID PANEL W/O CHOL/HDL RATIO
Cholesterol, Total: 139 mg/dL (ref 100–199)
HDL: 52 mg/dL (ref >39)
LDL Chol Calc (NIH): 69 mg/dL (ref 0–99)
Triglycerides: 100 mg/dL (ref 0–149)
VLDL Cholesterol Cal: 18 mg/dL (ref 5–40)

## 2021-11-20 LAB — COMPREHENSIVE METABOLIC PANEL
ALT: 15 IU/L (ref 0–32)
AST: 16 IU/L (ref 0–40)
Albumin/Globulin Ratio: 1.8 (ref 1.2–2.2)
Albumin: 4.4 g/dL (ref 3.7–4.7)
Alkaline Phosphatase: 97 IU/L (ref 44–121)
BUN/Creatinine Ratio: 23 (ref 12–28)
BUN: 15 mg/dL (ref 8–27)
Bilirubin Total: 0.5 mg/dL (ref 0.0–1.2)
CO2: 25 mmol/L (ref 20–29)
Calcium: 10.6 mg/dL — ABNORMAL HIGH (ref 8.7–10.3)
Chloride: 102 mmol/L (ref 96–106)
Creatinine, Ser: 0.64 mg/dL (ref 0.57–1.00)
Globulin, Total: 2.5 g/dL (ref 1.5–4.5)
Glucose: 103 mg/dL — ABNORMAL HIGH (ref 70–99)
Potassium: 4.6 mmol/L (ref 3.5–5.2)
Sodium: 141 mmol/L (ref 134–144)
Total Protein: 6.9 g/dL (ref 6.0–8.5)
eGFR: 94 mL/min/{1.73_m2} (ref >59)

## 2021-11-20 LAB — CBC WITH DIFFERENTIAL/PLATELET
Basophils Absolute: 0 10*3/uL (ref 0.0–0.2)
Basos: 0 %
EOS (ABSOLUTE): 0.2 10*3/uL (ref 0.0–0.4)
Eos: 2 %
Hematocrit: 43.6 % (ref 34.0–46.6)
Hemoglobin: 14.3 g/dL (ref 11.1–15.9)
Immature Grans (Abs): 0.1 10*3/uL (ref 0.0–0.1)
Immature Granulocytes: 1 %
Lymphocytes Absolute: 1.3 10*3/uL (ref 0.7–3.1)
Lymphs: 13 %
MCH: 30.8 pg (ref 26.6–33.0)
MCHC: 32.8 g/dL (ref 31.5–35.7)
MCV: 94 fL (ref 79–97)
Monocytes Absolute: 0.7 10*3/uL (ref 0.1–0.9)
Monocytes: 7 %
Neutrophils Absolute: 7.8 10*3/uL — ABNORMAL HIGH (ref 1.4–7.0)
Neutrophils: 77 %
Platelets: 227 10*3/uL (ref 150–450)
RBC: 4.65 x10E6/uL (ref 3.77–5.28)
RDW: 12.4 % (ref 11.7–15.4)
WBC: 10.1 10*3/uL (ref 3.4–10.8)

## 2021-11-20 LAB — VITAMIN D 25 HYDROXY (VIT D DEFICIENCY, FRACTURES): Vit D, 25-Hydroxy: 45.1 ng/mL (ref 30.0–100.0)

## 2021-11-21 LAB — URINE CULTURE

## 2021-11-23 ENCOUNTER — Ambulatory Visit: Payer: Medicare Other

## 2021-11-23 ENCOUNTER — Other Ambulatory Visit: Payer: Medicare Other

## 2021-11-30 ENCOUNTER — Other Ambulatory Visit: Payer: Self-pay

## 2021-11-30 ENCOUNTER — Ambulatory Visit (INDEPENDENT_AMBULATORY_CARE_PROVIDER_SITE_OTHER): Payer: Medicare Other | Admitting: Family Medicine

## 2021-11-30 ENCOUNTER — Encounter: Payer: Self-pay | Admitting: Family Medicine

## 2021-11-30 VITALS — BP 133/83 | HR 55 | Temp 98.1°F | Ht 61.81 in | Wt 212.0 lb

## 2021-11-30 DIAGNOSIS — S0993XA Unspecified injury of face, initial encounter: Secondary | ICD-10-CM | POA: Diagnosis not present

## 2021-11-30 NOTE — Progress Notes (Signed)
? ?BP 133/83   Pulse (!) 55   Temp 98.1 ?F (36.7 ?C) (Oral)   Ht 5' 1.81" (1.57 m)   Wt 212 lb (96.2 kg)   LMP  (LMP Unknown)   SpO2 98%   BMI 39.01 kg/m?   ? ?Subjective:  ? ? Patient ID: Anne Bell, female    DOB: [birth year], 71 y.o.   MRN: [id number] ? ?HPI: ?Anne Bell is a 71 y.o. female ? ?Chief Complaint  ?Patient presents with  ? Fall  ?  Patient here for follow up on fall, patient states that is she is getting better   ? ?Anne Bell presents today for follow up on her facial injury. She is feeling better. She notes that her face is not hurting. Her bruising is healing well. No dizziness. Has been using a cane when on uneven ground. No other concerns or complaints at this time.  ? ?Relevant past medical, surgical, family and social history reviewed and updated as indicated. Interim medical history since our last visit reviewed. ?Allergies and medications reviewed and updated. ? ?Review of Systems  ?Constitutional: Negative.   ?Respiratory: Negative.    ?Cardiovascular: Negative.   ?Gastrointestinal: Negative.   ?Musculoskeletal: Negative.   ?Skin:  Positive for color change. Negative for pallor, rash and wound.  ?Psychiatric/Behavioral: Negative.    ? ?Per HPI unless specifically indicated above ? ?   ?Objective:  ?  ?BP 133/83   Pulse (!) 55   Temp 98.1 ?F (36.7 ?C) (Oral)   Ht 5' 1.81" (1.57 m)   Wt 212 lb (96.2 kg)   LMP  (LMP Unknown)   SpO2 98%   BMI 39.01 kg/m?   ?Wt Readings from Last 3 Encounters:  ?11/30/21 212 lb (96.2 kg)  ?11/19/21 212 lb 3.2 oz (96.3 kg)  ?05/21/21 218 lb 9.6 oz (99.2 kg)  ?  ?Physical Exam ?Vitals and nursing note reviewed.  ?Constitutional:   ?   General: She is not in acute distress. ?   Appearance: Normal appearance. She is not ill-appearing, toxic-appearing or diaphoretic.  ?HENT:  ?   Head: Normocephalic and atraumatic.  ?   Right Ear: External ear normal.  ?   Left Ear: External ear normal.  ?   Nose: Nose normal.  ?   Mouth/Throat:  ?   Mouth: Mucous  membranes are moist.  ?   Pharynx: Oropharynx is clear.  ?Eyes:  ?   General: No scleral icterus.    ?   Right eye: No discharge.     ?   Left eye: No discharge.  ?   Extraocular Movements: Extraocular movements intact.  ?   Conjunctiva/sclera: Conjunctivae normal.  ?   Pupils: Pupils are equal, round, and reactive to light.  ?Cardiovascular:  ?   Rate and Rhythm: Normal rate and regular rhythm.  ?   Pulses: Normal pulses.  ?   Heart sounds: Normal heart sounds. No murmur heard. ?  No friction rub. No gallop.  ?Pulmonary:  ?   Effort: Pulmonary effort is normal. No respiratory distress.  ?   Breath sounds: Normal breath sounds. No stridor. No wheezing, rhonchi or rales.  ?Chest:  ?   Chest wall: No tenderness.  ?Musculoskeletal:     ?   General: Normal range of motion.  ?   Cervical back: Normal range of motion and neck supple.  ?Skin: ?   General: Skin is warm and dry.  ?   Capillary Refill: Capillary refill takes  less than 2 seconds.  ?   Coloration: Skin is not jaundiced or pale.  ?   Findings: Erythema present. No bruising, lesion or rash.  ?Neurological:  ?   General: No focal deficit present.  ?   Mental Status: She is alert and oriented to person, place, and time. Mental status is at baseline.  ?Psychiatric:     ?   Mood and Affect: Mood normal.     ?   Behavior: Behavior normal.     ?   Thought Content: Thought content normal.     ?   Judgment: Judgment normal.  ? ? ?Results for orders placed or performed in visit on 11/19/21  ?Urine Culture  ? Specimen: Urine  ? UR  ?Result Value Ref Range  ? Urine Culture, Routine Final report (A)   ? Organism ID, Bacteria Escherichia coli (A)   ? ORGANISM ID, BACTERIA Comment   ? Antimicrobial Susceptibility Comment   ?Microscopic Examination  ? Urine  ?Result Value Ref Range  ? WBC, UA 6-10 (A) 0 - 5 /hpf  ? RBC 0-2 0 - 2 /hpf  ? Epithelial Cells (non renal) 0-10 0 - 10 /hpf  ? Mucus, UA Present (A) Not Estab.  ? Bacteria, UA Few (A) None seen/Few  ?Urinalysis, Routine w  reflex microscopic  ?Result Value Ref Range  ? Specific Gravity, UA 1.015 1.005 - 1.030  ? pH, UA 7.0 5.0 - 7.5  ? Color, UA Yellow Yellow  ? Appearance Ur Cloudy (A) Clear  ? Leukocytes,UA 2+ (A) Negative  ? Protein,UA Trace (A) Negative/Trace  ? Glucose, UA Negative Negative  ? Ketones, UA Negative Negative  ? RBC, UA Trace (A) Negative  ? Bilirubin, UA Negative Negative  ? Urobilinogen, Ur 0.2 0.2 - 1.0 mg/dL  ? Nitrite, UA Negative Negative  ? Microscopic Examination See below:   ?CBC with Differential/Platelet  ?Result Value Ref Range  ? WBC 10.1 3.4 - 10.8 x10E3/uL  ? RBC 4.65 3.77 - 5.28 x10E6/uL  ? Hemoglobin 14.3 11.1 - 15.9 g/dL  ? Hematocrit 43.6 34.0 - 46.6 %  ? MCV 94 79 - 97 fL  ? MCH 30.8 26.6 - 33.0 pg  ? MCHC 32.8 31.5 - 35.7 g/dL  ? RDW 12.4 11.7 - 15.4 %  ? Platelets 227 150 - 450 x10E3/uL  ? Neutrophils 77 Not Estab. %  ? Lymphs 13 Not Estab. %  ? Monocytes 7 Not Estab. %  ? Eos 2 Not Estab. %  ? Basos 0 Not Estab. %  ? Neutrophils Absolute 7.8 (H) 1.4 - 7.0 x10E3/uL  ? Lymphocytes Absolute 1.3 0.7 - 3.1 x10E3/uL  ? Monocytes Absolute 0.7 0.1 - 0.9 x10E3/uL  ? EOS (ABSOLUTE) 0.2 0.0 - 0.4 x10E3/uL  ? Basophils Absolute 0.0 0.0 - 0.2 x10E3/uL  ? Immature Granulocytes 1 Not Estab. %  ? Immature Grans (Abs) 0.1 0.0 - 0.1 x10E3/uL  ?Comprehensive metabolic panel  ?Result Value Ref Range  ? Glucose 103 (H) 70 - 99 mg/dL  ? BUN 15 8 - 27 mg/dL  ? Creatinine, Ser 0.64 0.57 - 1.00 mg/dL  ? eGFR 94 >59 mL/min/1.73  ? BUN/Creatinine Ratio 23 12 - 28  ? Sodium 141 134 - 144 mmol/L  ? Potassium 4.6 3.5 - 5.2 mmol/L  ? Chloride 102 96 - 106 mmol/L  ? CO2 25 20 - 29 mmol/L  ? Calcium 10.6 (H) 8.7 - 10.3 mg/dL  ? Total Protein 6.9 6.0 - 8.5 g/dL  ?  Albumin 4.4 3.7 - 4.7 g/dL  ? Globulin, Total 2.5 1.5 - 4.5 g/dL  ? Albumin/Globulin Ratio 1.8 1.2 - 2.2  ? Bilirubin Total 0.5 0.0 - 1.2 mg/dL  ? Alkaline Phosphatase 97 44 - 121 IU/L  ? AST 16 0 - 40 IU/L  ? ALT 15 0 - 32 IU/L  ?Lipid Panel w/o Chol/HDL Ratio   ?Result Value Ref Range  ? Cholesterol, Total 139 100 - 199 mg/dL  ? Triglycerides 100 0 - 149 mg/dL  ? HDL 52 >39 mg/dL  ? VLDL Cholesterol Cal 18 5 - 40 mg/dL  ? LDL Chol Calc (NIH) 69 0 - 99 mg/dL  ?VITAMIN D 25 Hydroxy (Vit-D Deficiency, Fractures)  ?Result Value Ref Range  ? Vit D, 25-Hydroxy 45.1 30.0 - 100.0 ng/mL  ? ?   ?Assessment & Plan:  ? ?Problem List Items Addressed This Visit   ?None ?Visit Diagnoses   ? ? Facial injury, initial encounter    -  Primary  ? Healing well. Continue to monitor. Call with any concerns.   ? ?  ?  ? ?Follow up plan: ?Return in about 1 month (around 12/31/2021). ? ? ? ? ? ?

## 2021-12-05 ENCOUNTER — Other Ambulatory Visit: Payer: Self-pay | Admitting: Family Medicine

## 2021-12-05 DIAGNOSIS — I129 Hypertensive chronic kidney disease with stage 1 through stage 4 chronic kidney disease, or unspecified chronic kidney disease: Secondary | ICD-10-CM

## 2021-12-07 NOTE — Telephone Encounter (Signed)
Will need to call patient to verify if using [facility]. All Rx sent to [facility] on 11/19/21 #90/1refill. ?

## 2021-12-08 NOTE — Telephone Encounter (Signed)
Pt. Request prescriptions go to [facility]. ?Requested Prescriptions  ?Pending Prescriptions Disp Refills  ?? oxybutynin (DITROPAN-XL) 10 MG 24 hr tablet [Pharmacy Med Name: Oxybutynin Chloride ER 10 MG Oral Tablet Extended Release 24 Hour] 90 tablet 1  ?  Sig: TAKE 1 TABLET BY MOUTH AT  BEDTIME  ?  ? Urology:  Bladder Agents Passed - 12/05/2021 10:45 AM  ?  ?  Passed - Valid encounter within last 12 months  ?  Recent Outpatient Visits   ?      ? 1 week ago Facial injury, initial encounter  ? [place], [place] P, DO  ? 2 weeks ago Benign hypertensive renal disease  ? [place], DO  ? 6 months ago Routine general medical examination at a health care facility  ? [place], [place], DO  ? 1 year ago Benign hypertensive renal disease  ? [place], DO  ? 1 year ago Routine general medical examination at a health care facility  ? [place], [place] P, DO  ?  ?  ?Future Appointments   ?        ? In 2 months  [facility], [facility]  ? In 5 months Johnson, Megan P, DO [facility], [facility]  ?  ? ?  ?  ?  ?? metoprolol succinate (TOPROL-XL) 100 MG 24 hr tablet [Pharmacy Med Name: Metoprolol Succinate ER 100 MG Oral Tablet Extended Release 24 Hour] 90 tablet 1  ?  Sig: TAKE 1 TABLET BY MOUTH  DAILY WITH OR IMMEDIATELY  FOLLOWING A MEAL  ?  ? Cardiovascular:  Beta Blockers Passed - 12/05/2021 10:45 AM  ?  ?  Passed - Last BP in normal range  ?  BP Readings from Last 1 Encounters:  ?11/30/21 133/83  ?   ?  ?  Passed - Last Heart Rate in normal range  ?  Pulse Readings from Last 1 Encounters:  ?11/30/21 (!) 55  ?   ?  ?  Passed - Valid encounter within last 6 months  ?  Recent Outpatient Visits   ?      ? 1 week ago Facial injury, initial encounter  ? [place], [place] P, DO  ? 2 weeks ago Benign hypertensive renal disease  ? [place],  DO  ? 6 months ago Routine general medical examination at a health care facility  ? [place], [place] P, DO  ? 1 year ago Benign hypertensive renal disease  ? [place], DO  ? 1 year ago Routine general medical examination at a health care facility  ? [place], [place] P, DO  ?  ?  ?Future Appointments   ?        ? In 2 months  [facility]  ? In 5 months Wynetta Emery, Megan P, DO [facility], [facility]  ?  ? ?  ?  ?  ?? valsartan-hydrochlorothiazide (DIOVAN-HCT) 160-25 MG tablet [Pharmacy Med Name: Valsartan-hydroCHLOROthiazide 160-25 MG Oral Tablet] 90 tablet 1  ?  Sig: TAKE 1 TABLET BY MOUTH  DAILY  ?  ? Cardiovascular: ARB + Diuretic Combos Passed - 12/05/2021 10:45 AM  ?  ?  Passed - K in normal range and within 180 days  ?  Potassium  ?Date Value Ref Range Status  ?11/19/2021 4.6 3.5 -  5.2 mmol/L Final  ?10/10/2012 4.1 3.5 - 5.1 mmol/L Final  ?   ?  ?  Passed - Na in normal range and within 180 days  ?  Sodium  ?Date Value Ref Range Status  ?11/19/2021 141 134 - 144 mmol/L Final  ?10/10/2012 138 136 - 145 mmol/L Final  ?   ?  ?  Passed - Cr in normal range and within 180 days  ?  Creatinine  ?Date Value Ref Range Status  ?10/10/2012 0.79 0.60 - 1.30 mg/dL Final  ? ?Creatinine, Ser  ?Date Value Ref Range Status  ?11/19/2021 0.64 0.57 - 1.00 mg/dL Final  ?   ?  ?  Passed - eGFR is 10 or above and within 180 days  ?  EGFR (African American)  ?Date Value Ref Range Status  ?10/10/2012 >60  Final  ? ?GFR calc Af Wyvonnia Lora  ?Date Value Ref Range Status  ?11/07/2020 92 >59 mL/min/1.73 Final  ?  Comment:  ?  **In accordance with recommendations from the NKF-ASN Task force,** ?  [facility] is in the process of updating its eGFR calculation to the ?  2021 CKD-EPI creatinine equation that estimates kidney function ?  without a race variable. ?  ? ?EGFR (Non-African Amer.)  ?Date Value Ref Range Status  ?10/10/2012 >60  Final  ?  Comment:   ?  eGFR values <7m/min/1.73 m2 may be an indication of chronic ?kidney disease (CKD). ?Calculated eGFR is useful in patients with stable renal function. ?The eGFR calculation will not be reliable in acutely ill patients ?when serum creatinine is changing rapidly. It is not useful in  ?patients on dialysis. The eGFR calculation may not be applicable ?to patients at the low and high extremes of body sizes, pregnant ?women, and vegetarians. ?  ? ?GFR calc non Af Amer  ?Date Value Ref Range Status  ?11/07/2020 80 >59 mL/min/1.73 Final  ? ?eGFR  ?Date Value Ref Range Status  ?11/19/2021 94 >59 mL/min/1.73 Final  ?   ?  ?  Passed - Patient is not pregnant  ?  ?  Passed - Last BP in normal range  ?  BP Readings from Last 1 Encounters:  ?11/30/21 133/83  ?   ?  ?  Passed - Valid encounter within last 6 months  ?  Recent Outpatient Visits   ?      ? 1 week ago Facial injury, initial encounter  ? [place]  ? 2 weeks ago Benign hypertensive renal disease  ? [place] DO  ? 6 months ago Routine general medical examination at a health care facility  ? [place], DO  ? 1 year ago Benign hypertensive renal disease  ? [place] DO  ? 1 year ago Routine general medical examination at a health care facility  ? [place], DO  ?  ?  ?Future Appointments   ?        ? [facility]  ? In 5 months Johnson, Megan P, DO [facility], [facility]  ?  ? ?  ?  ?  ?? amLODipine (NORVASC) 5 MG tablet [Pharmacy Med Name: amLODIPine Besylate 5 MG Oral Tablet] 90 tablet 1  ?  Sig: TAKE 1 TABLET BY MOUTH DAILY  ?  ? Cardiovascular: Calcium Channel Blockers 2 Passed - 12/05/2021 10:45 AM  ?  ?  Passed - Last BP in normal range  ?  BP Readings from Last 1 Encounters:  ?11/30/21 133/83  ?   ?  ?  Passed - Last Heart Rate in normal range  ?  Pulse  Readings from Last 1 Encounters:  ?11/30/21 (!) 55  ?   ?  ?  Passed - Valid encounter within last 6 months  ?  Recent Outpatient Visits   ?      ? 1 week ago Facial injury, initial encounter  ? [place], [place] P, DO  ? 2 weeks ago Benign hypertensive renal disease  ? [place], DO  ? 6 months ago Routine general medical examination at a health care facility  ? [place], [place] P, DO  ? 1 year ago Benign hypertensive renal disease  ? [place], DO  ? 1 year ago Routine general medical examination at a health care facility  ? [place], [place] P, DO  ?  ?  ?Future Appointments   ?        ? In 2 months  [facility], [facility]  ? In 5 months Wynetta Emery, Barb Merino, DO [facility], [facility]  ?  ? ?  ?  ?  ? ?

## 2022-01-13 ENCOUNTER — Other Ambulatory Visit: Payer: Self-pay | Admitting: Family Medicine

## 2022-01-13 DIAGNOSIS — E7800 Pure hypercholesterolemia, unspecified: Secondary | ICD-10-CM

## 2022-01-13 NOTE — Telephone Encounter (Signed)
Requested medication (s) are due for refill today:   Yes ? ?Requested medication (s) are on the active medication list:   Yes ? ?Future visit scheduled:   Yes ? ? ?Last ordered: 11/19/2021 #90, 2 refills ? ?Returned because a 1 yr. Supply is being requested.  ? ?Requested Prescriptions  ?Pending Prescriptions Disp Refills  ? atorvastatin (LIPITOR) 20 MG tablet [Pharmacy Med Name: Atorvastatin Calcium 20 MG Oral Tablet] 100 tablet 2  ?  Sig: TAKE 1 TABLET BY MOUTH ONCE DAILY  ?  ? Cardiovascular:  Antilipid - Statins Failed - 01/13/2022  7:46 AM  ?  ?  Failed - Lipid Panel in normal range within the last 12 months  ?  Cholesterol, Total  ?Date Value Ref Range Status  ?11/19/2021 139 100 - 199 mg/dL Final  ? ?Cholesterol  ?Date Value Ref Range Status  ?10/10/2012 175 0 - 200 mg/dL Final  ? ?Cholesterol Piccolo, [place]  ?Date Value Ref Range Status  ?02/24/2016 158 <200 mg/dL Final  ?  Comment:  ?                          Desirable                <200 ?                        Borderline High      200- 239 ?                        High                     >239 ?  ? ?Ldl Cholesterol, Calc  ?Date Value Ref Range Status  ?10/10/2012 88 0 - 100 mg/dL Final  ? ?LDL Chol Calc (NIH)  ?Date Value Ref Range Status  ?11/19/2021 69 0 - 99 mg/dL Final  ? ?HDL Cholesterol  ?Date Value Ref Range Status  ?10/10/2012 49 40 - 60 mg/dL Final  ? ?HDL  ?Date Value Ref Range Status  ?11/19/2021 52 >39 mg/dL Final  ? ?Triglycerides  ?Date Value Ref Range Status  ?11/19/2021 100 0 - 149 mg/dL Final  ?10/10/2012 191 0 - 200 mg/dL Final  ? ?Triglycerides Piccolo,Waived  ?Date Value Ref Range Status  ?02/24/2016 157 (H) <150 mg/dL Final  ?  Comment:  ?                          Normal                   <150 ?                        Borderline High     150 - 199 ?                        High                200 - 499 ?                        Very High                >499 ?  ? ?  ?  ?  Passed - Patient is not pregnant  ?  ?  Passed - Valid encounter  within last 12 months  ?  Recent Outpatient Visits   ? ?      ? 1 month ago Facial injury, initial encounter  ? [place], [place], DO  ? 1 month ago Benign hypertensive renal disease  ? [place], DO  ? 7 months ago Routine general medical examination at a health care facility  ? [place], [place] P, DO  ? 1 year ago Benign hypertensive renal disease  ? [place], DO  ? 1 year ago Routine general medical examination at a health care facility  ? [place] P, DO  ? ?  ?  ?Future Appointments   ? ?        ? In 1 month  [place]  ? In 4 months Wynetta Emery, Barb Merino, DO [facility], [facility]  ? ?  ? ? ?  ?  ?  ? ?

## 2022-02-17 ENCOUNTER — Ambulatory Visit (INDEPENDENT_AMBULATORY_CARE_PROVIDER_SITE_OTHER): Payer: Medicare Other | Admitting: *Deleted

## 2022-02-17 DIAGNOSIS — Z0000 Encounter for general adult medical examination without abnormal findings: Secondary | ICD-10-CM | POA: Diagnosis not present

## 2022-02-17 NOTE — Patient Instructions (Signed)
Anne Bell , Thank you for taking time to come for your Medicare Wellness Visit. I appreciate your ongoing commitment to your health goals. Please review the following plan we discussed and let me know if I can assist you in the future.   Screening recommendations/referrals: Colonoscopy: up to date Mammogram: scheduled Bone Density: scheduled Recommended yearly ophthalmology/optometry visit for glaucoma screening and checkup Recommended yearly dental visit for hygiene and checkup  Vaccinations: Influenza vaccine: up to date Pneumococcal vaccine: up to date Tdap vaccine: up to date Shingles vaccine: Education provided    Advanced directives: on file  Conditions/risks identified:   Next appointment: 05-26-2022 @ 10;20 [facility] 65 Years and Older, Female Preventive care refers to lifestyle choices and visits with your health care provider that can promote health and wellness. What does preventive care include? A yearly physical exam. This is also called an annual well check. Dental exams once or twice a year. Routine eye exams. Ask your health care provider how often you should have your eyes checked. Personal lifestyle choices, including: Daily care of your teeth and gums. Regular physical activity. Eating a healthy diet. Avoiding tobacco and drug use. Limiting alcohol use. Practicing safe sex. Taking low-dose aspirin every day. Taking vitamin and mineral supplements as recommended by your health care provider. What happens during an annual well check? The services and screenings done by your health care provider during your annual well check will depend on your age, overall health, lifestyle risk factors, and family history of disease. Counseling  Your health care provider may ask you questions about your: Alcohol use. Tobacco use. Drug use. Emotional well-being. Home and relationship well-being. Sexual activity. Eating habits. History of  falls. Memory and ability to understand (cognition). Work and work Astronomer. Reproductive health. Screening  You may have the following tests or measurements: Height, weight, and BMI. Blood pressure. Lipid and cholesterol levels. These may be checked every 5 years, or more frequently if you are over 72 years old. Skin check. Lung cancer screening. You may have this screening every year starting at age 72 if you have a 30-pack-year history of smoking and currently smoke or have quit within the past 15 years. Fecal occult blood test (FOBT) of the stool. You may have this test every year starting at age 72. Flexible sigmoidoscopy or colonoscopy. You may have a sigmoidoscopy every 5 years or a colonoscopy every 10 years starting at age 72. Hepatitis C blood test. Hepatitis B blood test. Sexually transmitted disease (STD) testing. Diabetes screening. This is done by checking your blood sugar (glucose) after you have not eaten for a while (fasting). You may have this done every 1-3 years. Bone density scan. This is done to screen for osteoporosis. You may have this done starting at age 72. Mammogram. This may be done every 1-2 years. Talk to your health care provider about how often you should have regular mammograms. Talk with your health care provider about your test results, treatment options, and if necessary, the need for more tests. Vaccines  Your health care provider may recommend certain vaccines, such as: Influenza vaccine. This is recommended every year. Tetanus, diphtheria, and acellular pertussis (Tdap, Td) vaccine. You may need a Td booster every 10 years. Zoster vaccine. You may need this after age 72. Pneumococcal 13-valent conjugate (PCV13) vaccine. One dose is recommended after age 72. Pneumococcal polysaccharide (PPSV23) vaccine. One dose is recommended after age 72. Talk to your health care provider about which screenings and  vaccines you need and how often you need  them. This information is not intended to replace advice given to you by your health care provider. Make sure you discuss any questions you have with your health care provider. Document Released: 09/26/2015 Document Revised: 05/19/2016 Document Reviewed: 07/01/2015 Elsevier Interactive Patient Education  2017 [employer] Prevention in the Home Falls can cause injuries. They can happen to people of all ages. There are many things you can do to make your home safe and to help prevent falls. What can I do on the outside of my home? Regularly fix the edges of walkways and driveways and fix any cracks. Remove anything that might make you trip as you walk through a door, such as a raised step or threshold. Trim any bushes or trees on the path to your home. Use bright outdoor lighting. Clear any walking paths of anything that might make someone trip, such as rocks or tools. Regularly check to see if handrails are loose or broken. Make sure that both sides of any steps have handrails. Any raised decks and porches should have guardrails on the edges. Have any leaves, snow, or ice cleared regularly. Use sand or salt on walking paths during winter. Clean up any spills in your garage right away. This includes oil or grease spills. What can I do in the bathroom? Use night lights. Install grab bars by the toilet and in the tub and shower. Do not use towel bars as grab bars. Use non-skid mats or decals in the tub or shower. If you need to sit down in the shower, use a plastic, non-slip stool. Keep the floor dry. Clean up any water that spills on the floor as soon as it happens. Remove soap buildup in the tub or shower regularly. Attach bath mats securely with double-sided non-slip rug tape. Do not have throw rugs and other things on the floor that can make you trip. What can I do in the bedroom? Use night lights. Make sure that you have a light by your bed that is easy to reach. Do not use  any sheets or blankets that are too big for your bed. They should not hang down onto the floor. Have a firm chair that has side arms. You can use this for support while you get dressed. Do not have throw rugs and other things on the floor that can make you trip. What can I do in the kitchen? Clean up any spills right away. Avoid walking on wet floors. Keep items that you use a lot in easy-to-reach places. If you need to reach something above you, use a strong step stool that has a grab bar. Keep electrical cords out of the way. Do not use floor polish or wax that makes floors slippery. If you must use wax, use non-skid floor wax. Do not have throw rugs and other things on the floor that can make you trip. What can I do with my stairs? Do not leave any items on the stairs. Make sure that there are handrails on both sides of the stairs and use them. Fix handrails that are broken or loose. Make sure that handrails are as long as the stairways. Check any carpeting to make sure that it is firmly attached to the stairs. Fix any carpet that is loose or worn. Avoid having throw rugs at the top or bottom of the stairs. If you do have throw rugs, attach them to the floor with carpet tape. Make  sure that you have a light switch at the top of the stairs and the bottom of the stairs. If you do not have them, ask someone to add them for you. What else can I do to help prevent falls? Wear shoes that: Do not have high heels. Have rubber bottoms. Are comfortable and fit you well. Are closed at the toe. Do not wear sandals. If you use a stepladder: Make sure that it is fully opened. Do not climb a closed stepladder. Make sure that both sides of the stepladder are locked into place. Ask someone to hold it for you, if possible. Clearly mark and make sure that you can see: Any grab bars or handrails. First and last steps. Where the edge of each step is. Use tools that help you move around (mobility aids)  if they are needed. These include: Canes. Walkers. Scooters. Crutches. Turn on the lights when you go into a dark area. Replace any light bulbs as soon as they burn out. Set up your furniture so you have a clear path. Avoid moving your furniture around. If any of your floors are uneven, fix them. If there are any pets around you, be aware of where they are. Review your medicines with your doctor. Some medicines can make you feel dizzy. This can increase your chance of falling. Ask your doctor what other things that you can do to help prevent falls. This information is not intended to replace advice given to you by your health care provider. Make sure you discuss any questions you have with your health care provider. Document Released: 06/26/2009 Document Revised: 02/05/2016 Document Reviewed: 10/04/2014 Elsevier Interactive Patient Education  2017 [employer].

## 2022-02-17 NOTE — Progress Notes (Signed)
Subjective:   Anne Bell is a 71 y.o. female who presents for Medicare Annual (Subsequent) preventive examination.  I connected with  Anne Bell on 02/17/22 by a telephone enabled telemedicine application and verified that I am speaking with the correct person using two identifiers.   I discussed the limitations of evaluation and management by telemedicine. The patient expressed understanding and agreed to proceed.  Patient location: home  Provider location: Tele-Health-home      Review of Systems     Cardiac Risk Factors include: advanced age (>3655men, 61>65 women);obesity (BMI >30kg/m2)     Objective:    Today's Vitals   02/17/22 1237  PainSc: 2    There is no height or weight on file to calculate BMI.     02/17/2022   12:44 PM 02/16/2021    1:01 PM 02/13/2020   10:20 AM 02/22/2019   10:26 AM 01/09/2018    9:08 AM 12/28/2016    2:04 PM 08/21/2015    4:34 PM  Advanced Directives  Does Patient Have a Medical Advance Directive? Yes Yes Yes No Yes Yes No  Type of Estate agentAdvance Directive Healthcare Power of State Street Corporationttorney Healthcare Power of GreencastleAttorney;Living will Living will;Healthcare Power of [employer]otive Groupttorney  Healthcare Power of IndependenceAttorney;Living will Healthcare Power of [place]orney;Living will   Does patient want to make changes to medical advance directive?      No - Patient declined   Copy of Healthcare Power of Attorney in Chart? No - copy requested No - copy requested No - copy requested  No - copy requested No - copy requested   Would patient like information on creating a medical advance directive?       Yes - Educational materials given    Current Medications (verified) Outpatient Encounter Medications as of 02/17/2022  Medication Sig   amLODipine (NORVASC) 5 MG tablet TAKE 1 TABLET BY MOUTH DAILY   atorvastatin (LIPITOR) 20 MG tablet TAKE 1 TABLET BY MOUTH ONCE DAILY   fluticasone (FLONASE) 50 MCG/ACT nasal spray Place 2 sprays into both nostrils daily.   Lutein 6 MG CAPS Take 6 mg by  mouth daily.    metoprolol succinate (TOPROL-XL) 100 MG 24 hr tablet TAKE 1 TABLET BY MOUTH  DAILY WITH OR IMMEDIATELY  FOLLOWING A MEAL   MULTIPLE VITAMIN PO Take 1 tablet by mouth daily.    naproxen (NAPROSYN) 500 MG tablet Take 1 tablet (500 mg total) by mouth 2 (two) times daily with a meal.   oxybutynin (DITROPAN-XL) 10 MG 24 hr tablet TAKE 1 TABLET BY MOUTH AT  BEDTIME   valsartan-hydrochlorothiazide (DIOVAN-HCT) 160-25 MG tablet TAKE 1 TABLET BY MOUTH  DAILY   No facility-administered encounter medications on file as of 02/17/2022.    Allergies (verified) Celecoxib, Prednisone, and Sulfa antibiotics   History: Past Medical History:  Diagnosis Date   Allergy    Depression    Hyperlipidemia    Hypertension    Past Surgical History:  Procedure Laterality Date   BREAST CYST ASPIRATION  1970's   ? side   PLANTAR FASCIA SURGERY Left 2008   Family History  Problem Relation Age of Onset   Alcohol abuse Father    Mental illness Mother        Depression   Cancer Mother        started in back    Brain cancer Sister    Breast cancer Neg Hx    Social History   Socioeconomic History   Marital status: Divorced  Spouse name: Not on file   Number of children: Not on file   Years of education: Not on file   Highest education level: Some college, no degree  Occupational History   Occupation: Retired  Tobacco Use   Smoking status: Never   Smokeless tobacco: Never  Vaping Use   Vaping Use: Never used  Substance and Sexual Activity   Alcohol use: Yes    Comment: occasional   Drug use: No   Sexual activity: Yes    Birth control/protection: None  Other Topics Concern   Not on file  Social History Narrative   Not on file   Social Determinants of Health   Financial Resource Strain: Low Risk    Difficulty of Paying Living Expenses: Not hard at all  Food Insecurity: No Food Insecurity   Worried About Programme researcher, broadcasting/film/video in the Last Year: Never true   Ran Out of Food in  the Last Year: Never true  Transportation Needs: No Transportation Needs   Lack of Transportation (Medical): No   Lack of Transportation (Non-Medical): No  Physical Activity: Insufficiently Active   Days of Exercise per Week: 3 days   Minutes of Exercise per Session: 40 min  Stress: Not on file  Social Connections: Socially Isolated   Frequency of Communication with Friends and Family: More than three times a week   Frequency of Social Gatherings with Friends and Family: More than three times a week   Attends Religious Services: Never   Database administrator or Organizations: No   Attends Engineer, structural: Never   Marital Status: Divorced    Tobacco Counseling Counseling given: Not Answered   Clinical Intake:  Pre-visit preparation completed: Yes  Pain : 0-10 Pain Score: 2  Pain Type: Acute pain Pain Location: Shoulder Pain Orientation: Right Pain Descriptors / Indicators: Burning, Aching Pain Onset: In the past 7 days     Nutritional Risks: None Diabetes: No  How often do you need to have someone help you when you read instructions, pamphlets, or other written materials from your doctor or pharmacy?: 1 - Never  Diabetic?  no  Interpreter Needed?: No  Information entered by :: Remi Haggard LPN   Activities of Daily Living    02/17/2022   12:47 PM  In your present state of health, do you have any difficulty performing the following activities:  Hearing? 0  Vision? 0  Difficulty concentrating or making decisions? 0  Walking or climbing stairs? 0  Dressing or bathing? 0  Doing errands, shopping? 0  Preparing Food and eating ? N  Using the Toilet? N  In the past six months, have you accidently leaked urine? Y  Do you have problems with loss of bowel control? N  Managing your Medications? N  Managing your Finances? N  Housekeeping or managing your Housekeeping? N    Patient Care Team: Dorcas Carrow, DO as PCP - General (Family  Medicine)  Indicate any recent Medical Services you may have received from other than [facility] providers in the past year (date may be approximate).     Assessment:   This is a routine wellness examination for Kasandra.  Hearing/Vision screen Hearing Screening - Comments:: No trouble hearing Vision Screening - Comments:: Up to date [place] [facility]  Dietary issues and exercise activities discussed:     Goals Addressed             This Visit's Progress    Patient Stated  Continue current lifestyle       Depression Screen    02/17/2022   12:46 PM 12/01/2021    4:54 PM 11/19/2021   10:12 AM 05/21/2021   10:11 AM 02/16/2021    1:02 PM 11/07/2020   10:29 AM 05/07/2020   10:55 AM  PHQ 2/9 Scores  PHQ - 2 Score 0 2 0 0 0 0 0  PHQ- 9 Score  2 1 0  1 0    Fall Risk    02/17/2022   12:39 PM 11/19/2021   10:12 AM 05/21/2021   10:10 AM 02/16/2021    1:01 PM 05/07/2020    9:40 AM  Fall Risk   Falls in the past year? 1 1 0 1 0  Comment    got dizzy and ankle gave away   Number falls in past yr: 1 0 0 0 0  Injury with Fall? 0 1 0 0 0  Risk for fall due to :  History of fall(s) No Fall Risks Medication side effect   Follow up Falls evaluation completed;Education provided;Falls prevention discussed Falls evaluation completed Falls evaluation completed Falls evaluation completed;Education provided;Falls prevention discussed     FALL RISK PREVENTION PERTAINING TO THE HOME:  Any stairs in or around the home? Yes  If so, are there any without handrails? No  Home free of loose throw rugs in walkways, pet beds, electrical cords, etc? Yes  Adequate lighting in your home to reduce risk of falls? Yes   ASSISTIVE DEVICES UTILIZED TO PREVENT FALLS:  Life alert? No  Use of a cane, walker or w/c? Yes  Grab bars in the bathroom? Yes  Shower chair or bench in shower? No  Elevated toilet seat or a handicapped toilet? Yes   TIMED UP AND GO:  Was the test performed? No .    Cognitive Function:         02/17/2022   12:41 PM 02/16/2021    1:03 PM 02/22/2019   10:26 AM 01/09/2018    9:08 AM 12/28/2016    2:07 PM  6CIT Screen  What Year? 0 points 0 points 0 points 0 points 0 points  What month? 0 points 0 points 0 points 0 points 0 points  What time? 0 points 0 points 0 points 0 points 0 points  Count back from 20 0 points 0 points 0 points 0 points 0 points  Months in reverse 0 points 0 points 0 points 0 points 2 points  Repeat phrase 0 points 2 points 0 points 0 points 4 points  Total Score 0 points 2 points 0 points 0 points 6 points    Immunizations Immunization History  Administered Date(s) Administered   Fluad Quad(high Dose 65+) 07/18/2021   Influenza Whole 06/13/2017   Influenza, High Dose Seasonal PF 06/20/2018, 06/13/2020   Influenza-Unspecified 05/31/2015, 06/13/2017, 06/12/2019   Moderna SARS-COV2 Booster Vaccination 02/08/2021   PFIZER(Purple Top)SARS-COV-2 Vaccination 11/09/2019, 12/04/2019   Pneumococcal Conjugate-13 05/31/2015   Pneumococcal Polysaccharide-23 12/28/2016   Tdap 02/24/2016   Zoster, Live 05/28/2014    TDAP status: Up to date  Flu Vaccine status: Up to date  Pneumococcal vaccine status: Up to date  Covid-19 vaccine status: Information provided on how to obtain vaccines.   Qualifies for Shingles Vaccine? Yes   Zostavax completed Yes   Shingrix Completed?: No.    Education has been provided regarding the importance of this vaccine. Patient has been advised to call insurance company to determine out of pocket expense if they  have not yet received this vaccine. Advised may also receive vaccine at local pharmacy or Health Dept. Verbalized acceptance and understanding.  Screening Tests Health Maintenance  Topic Date Due   Zoster Vaccines- Shingrix (1 of 2) Never done   COVID-19 Vaccine (3 - Booster for Pfizer series) 03/05/2022 (Originally 04/05/2021)   INFLUENZA VACCINE  04/13/2022   MAMMOGRAM  11/20/2022   Fecal DNA (Cologuard)  09/09/2024    TETANUS/TDAP  02/23/2026   Pneumonia Vaccine 23+ Years old  Completed   DEXA SCAN  Completed   Hepatitis C Screening  Completed   HPV VACCINES  Aged Out    Health Maintenance  Health Maintenance Due  Topic Date Due   Zoster Vaccines- Shingrix (1 of 2) Never done    Colorectal cancer screening: Type of screening: Cologuard. Completed 2023. Repeat every 3 years  Mammogram scheduled 02-23-2022  Bone Density scheduled 02-23-2022  Lung Cancer Screening: (Low Dose CT Chest recommended if Age 69-80 years, 30 pack-year currently smoking OR have quit w/in 15years.) does not qualify.   Lung Cancer Screening Referral:   Additional Screening:  Hepatitis C Screening: does not qualify; Completed 2016  Vision Screening: Recommended annual ophthalmology exams for early detection of glaucoma and other disorders of the eye. Is the patient up to date with their annual eye exam?  Yes  Who is the provider or what is the name of the office in which the patient attends annual eye exams? [facility] If pt is not established with a provider, would they like to be referred to a provider to establish care? No .   Dental Screening: Recommended annual dental exams for proper oral hygiene  Community Resource Referral / Chronic Care Management: CRR required this visit?  No   CCM required this visit?  No      Plan:     I have personally reviewed and noted the following in the patient's chart:   Medical and social history Use of alcohol, tobacco or illicit drugs  Current medications and supplements including opioid prescriptions.  Functional ability and status Nutritional status Physical activity Advanced directives List of other physicians Hospitalizations, surgeries, and ER visits in previous 12 months Vitals Screenings to include cognitive, depression, and falls Referrals and appointments  In addition, I have reviewed and discussed with patient certain preventive protocols, quality  metrics, and best practice recommendations. A written personalized care plan for preventive services as well as general preventive health recommendations were provided to patient.     Remi Haggard, LPN   4/[id number]/9811   Nurse Notes:

## 2022-02-19 ENCOUNTER — Ambulatory Visit: Payer: Medicare Other

## 2022-02-22 ENCOUNTER — Telehealth: Payer: Self-pay | Admitting: Family Medicine

## 2022-02-22 NOTE — Telephone Encounter (Signed)
Spoke with patient and follow up with call. Patient is requesting last office visit notes and imaging results for her to have additional accidental insurance coverage. Patient is requesting to have requested information mailed to her. Verbalized understanding.

## 2022-02-22 NOTE — Telephone Encounter (Signed)
Copied from CRM [id number]. Topic: Medical Record Request - Patient ROI Request >> Feb 19, 2022 11:03 AM Lyman Speller wrote: Reason for CRM: Pt needs records from when she was referred to the hospital for an MRI on her head and 3 xrays of her right hand from 3.9.23/ please advise asap

## 2022-02-23 ENCOUNTER — Ambulatory Visit
Admission: RE | Admit: 2022-02-23 | Discharge: 2022-02-23 | Disposition: A | Discharge status: Home / Self Care | Payer: Medicare Other | Source: Ambulatory Visit | Attending: Family Medicine | Admitting: Family Medicine

## 2022-02-23 ENCOUNTER — Encounter: Payer: Self-pay | Admitting: Family Medicine

## 2022-02-23 DIAGNOSIS — Z780 Asymptomatic menopausal state: Secondary | ICD-10-CM | POA: Insufficient documentation

## 2022-02-23 DIAGNOSIS — Z1231 Encounter for screening mammogram for malignant neoplasm of breast: Secondary | ICD-10-CM | POA: Insufficient documentation

## 2022-02-23 DIAGNOSIS — Z13820 Encounter for screening for osteoporosis: Secondary | ICD-10-CM | POA: Diagnosis not present

## 2022-02-23 DIAGNOSIS — Z79899 Other long term (current) drug therapy: Secondary | ICD-10-CM | POA: Diagnosis not present

## 2022-02-23 DIAGNOSIS — M8589 Other specified disorders of bone density and structure, multiple sites: Secondary | ICD-10-CM | POA: Insufficient documentation

## 2022-02-24 ENCOUNTER — Encounter: Payer: Self-pay | Admitting: Family Medicine

## 2022-03-17 ENCOUNTER — Other Ambulatory Visit: Payer: Self-pay | Admitting: Family Medicine

## 2022-03-17 DIAGNOSIS — I129 Hypertensive chronic kidney disease with stage 1 through stage 4 chronic kidney disease, or unspecified chronic kidney disease: Secondary | ICD-10-CM

## 2022-03-17 NOTE — Telephone Encounter (Signed)
Requested Prescriptions  Pending Prescriptions Disp Refills  . oxybutynin (DITROPAN-XL) 10 MG 24 hr tablet [Pharmacy Med Name: Oxybutynin Chloride ER 10 MG Oral Tablet Extended Release 24 Hour] 100 tablet 2    Sig: TAKE 1 TABLET BY MOUTH AT  BEDTIME     Urology:  Bladder Agents Passed - 03/17/2022  7:44 AM      Passed - Valid encounter within last 12 months    Recent Outpatient Visits          3 months ago Facial injury, initial encounter   [place], Megan P, DO   3 months ago Benign hypertensive renal disease   [facility] Johnson, Megan P, DO   10 months ago Routine general medical examination at a health care facility   [facility], [place], DO   1 year ago Benign hypertensive renal disease   [place], Megan P, DO   1 year ago Routine general medical examination at a health care facility   [facility], [place], DO      Future Appointments            In 2 months Johnson, Megan P, DO [place], [facility]           . metoprolol succinate (TOPROL-XL) 100 MG 24 hr tablet [Pharmacy Med Name: Metoprolol Succinate ER 100 MG Oral Tablet Extended Release 24 Hour] 90 tablet 1    Sig: TAKE 1 TABLET BY MOUTH DAILY  WITH OR IMMEDIATELY FOLLOWING A  MEAL     Cardiovascular:  Beta Blockers Passed - 03/17/2022  7:44 AM      Passed - Last BP in normal range    BP Readings from Last 1 Encounters:  11/30/21 133/83         Passed - Last Heart Rate in normal range    Pulse Readings from Last 1 Encounters:  11/30/21 (!) 55         Passed - Valid encounter within last 6 months    Recent Outpatient Visits          3 months ago Facial injury, initial encounter   [place], [place], DO   3 months ago Benign hypertensive renal disease   [facility] [place], Megan P, DO   10 months ago Routine general medical examination at a health care facility    [facility], [place], DO   1 year ago Benign hypertensive renal disease   [facility] Valerie Roys, DO   1 year ago Routine general medical examination at a health care facility   [facility], [place], DO      Future Appointments            In 2 months Johnson, Megan P, DO [place], [facility]           . amLODipine (NORVASC) 5 MG tablet [Pharmacy Med Name: amLODIPine Besylate 5 MG Oral Tablet] 100 tablet 2    Sig: TAKE 1 TABLET BY MOUTH DAILY     Cardiovascular: Calcium Channel Blockers 2 Passed - 03/17/2022  7:44 AM      Passed - Last BP in normal range    BP Readings from Last 1 Encounters:  11/30/21 133/83         Passed - Last Heart Rate in normal range    Pulse Readings from Last 1 Encounters:  11/30/21 (!) 55  Passed - Valid encounter within last 6 months    Recent Outpatient Visits          3 months ago Facial injury, initial encounter   [place], Megan P, DO   3 months ago Benign hypertensive renal disease   [facility] Johnson, Megan P, DO   10 months ago Routine general medical examination at a health care facility   [facility], [place], DO   1 year ago Benign hypertensive renal disease   [facility] Valerie Roys, DO   1 year ago Routine general medical examination at a health care facility   [facility], Barb Merino, DO      Future Appointments            In 2 months Johnson, Megan P, DO [place], [facility]           . valsartan-hydrochlorothiazide (DIOVAN-HCT) 160-25 MG tablet [Pharmacy Med Name: Valsartan-hydroCHLOROthiazide 160-25 MG Oral Tablet] 90 tablet 1    Sig: TAKE 1 TABLET BY MOUTH DAILY     Cardiovascular: ARB + Diuretic Combos Passed - 03/17/2022  7:44 AM      Passed - K in normal range and within 180 days    Potassium  Date Value Ref Range Status  11/19/2021  4.6 3.5 - 5.2 mmol/L Final  10/10/2012 4.1 3.5 - 5.1 mmol/L Final         Passed - Na in normal range and within 180 days    Sodium  Date Value Ref Range Status  11/19/2021 141 134 - 144 mmol/L Final  10/10/2012 138 136 - 145 mmol/L Final         Passed - Cr in normal range and within 180 days    Creatinine  Date Value Ref Range Status  10/10/2012 0.79 0.60 - 1.30 mg/dL Final   Creatinine, Ser  Date Value Ref Range Status  11/19/2021 0.64 0.57 - 1.00 mg/dL Final         Passed - eGFR is 10 or above and within 180 days    EGFR (African American)  Date Value Ref Range Status  10/10/2012 >60  Final   GFR calc Af Amer  Date Value Ref Range Status  11/07/2020 92 >59 mL/min/1.73 Final    Comment:    **In accordance with recommendations from the NKF-ASN Task force,**   [facility] is in the process of updating its eGFR calculation to the   2021 CKD-EPI creatinine equation that estimates kidney function   without a race variable.    EGFR (Non-African Amer.)  Date Value Ref Range Status  10/10/2012 >60  Final    Comment:    eGFR values <55m/min/1.73 m2 may be an indication of chronic kidney disease (CKD). Calculated eGFR is useful in patients with stable renal function. The eGFR calculation will not be reliable in acutely ill patients when serum creatinine is changing rapidly. It is not useful in  patients on dialysis. The eGFR calculation may not be applicable to patients at the low and high extremes of body sizes, pregnant women, and vegetarians.    GFR calc non Af Amer  Date Value Ref Range Status  11/07/2020 80 >59 mL/min/1.73 Final   eGFR  Date Value Ref Range Status  11/19/2021 94 >59 mL/min/1.73 Final         Passed - Patient is not pregnant      Passed - Last BP in normal range  BP Readings from Last 1 Encounters:  11/30/21 133/83         Passed - Valid encounter within last 6 months    Recent Outpatient Visits          3 months ago Facial injury,  initial encounter   [place], Megan P, DO   3 months ago Benign hypertensive renal disease   [facility] Johnson, Megan P, DO   10 months ago Routine general medical examination at a health care facility   [facility], [place], DO   1 year ago Benign hypertensive renal disease   [place], Megan P, DO   1 year ago Routine general medical examination at a health care facility   [place], Barb Merino, DO      Future Appointments            In 2 months Wynetta Emery, Barb Merino, DO [facility], [facility]

## 2022-04-04 ENCOUNTER — Other Ambulatory Visit: Payer: Self-pay | Admitting: Family Medicine

## 2022-04-04 DIAGNOSIS — J301 Allergic rhinitis due to pollen: Secondary | ICD-10-CM

## 2022-04-06 NOTE — Telephone Encounter (Signed)
Requested Prescriptions  Pending Prescriptions Disp Refills  . fluticasone (FLONASE) 50 MCG/ACT nasal spray [Pharmacy Med Name: Fluticasone Propionate 50 MCG/ACT Nasal Suspension] 48 g 3    Sig: USE 2 SPRAYS IN BOTH  NOSTRILS DAILY     Ear, Nose, and Throat: Nasal Preparations - Corticosteroids Passed - 04/04/2022 12:26 PM      Passed - Valid encounter within last 12 months    Recent Outpatient Visits          4 months ago Facial injury, initial encounter   [facility] [place], Megan P, DO   4 months ago Benign hypertensive renal disease   [facility] Johnson, Megan P, DO   10 months ago Routine general medical examination at a health care facility   [facility], [place] P, DO   1 year ago Benign hypertensive renal disease   [facility] Dorcas [place], DO   1 year ago Routine general medical examination at a health care facility   [facility] [place], Oralia Rud, DO      Future Appointments            In 1 month Laural Benes, Oralia Rud, DO [facility], [facility]

## 2022-05-03 ENCOUNTER — Encounter: Payer: Self-pay | Admitting: Family Medicine

## 2022-05-26 ENCOUNTER — Encounter: Payer: Medicare Other | Admitting: Family Medicine

## 2022-05-31 ENCOUNTER — Ambulatory Visit (INDEPENDENT_AMBULATORY_CARE_PROVIDER_SITE_OTHER): Payer: Medicare Other | Admitting: Nurse Practitioner

## 2022-05-31 ENCOUNTER — Encounter: Payer: Self-pay | Admitting: Nurse Practitioner

## 2022-05-31 VITALS — BP 128/88 | HR 62 | Temp 98.3°F | Ht 61.0 in | Wt 208.2 lb

## 2022-05-31 DIAGNOSIS — R399 Unspecified symptoms and signs involving the genitourinary system: Secondary | ICD-10-CM | POA: Diagnosis not present

## 2022-05-31 DIAGNOSIS — R051 Acute cough: Secondary | ICD-10-CM

## 2022-05-31 MED ORDER — FLUCONAZOLE 150 MG PO TABS: 150.0000 mg | ORAL_TABLET | Freq: Once | ORAL | 0 refills | Status: AC

## 2022-05-31 MED ORDER — AMOXICILLIN-POT CLAVULANATE 875-125 MG PO TABS: 1.0000 | ORAL_TABLET | Freq: Two times a day (BID) | ORAL | 0 refills | Status: AC

## 2022-05-31 NOTE — Assessment & Plan Note (Addendum)
Acute for 1 1/2 weeks with minimal improvement and laryngitis presenting.  Will send in Augmentin to take for 7 days and Diflucan for prophylaxis to prevent yeast with abx.  Recommend continue Coricidin at home for symptom management and salt water gargles.  Recommend: - Increased rest - Increasing Fluids - Acetaminophen as needed for fever/pain.  - Salt water gargling, chloraseptic spray and throat lozenges - Mucinex.  - Humidifying the air. Return to office as needed for worsening or ongoing symptoms.

## 2022-05-31 NOTE — Progress Notes (Signed)
BP 128/88 (BP Location: Left Arm, Patient Position: Sitting)   Pulse 62   Temp 98.3 F (36.8 C) (Oral)   Ht _0  (1.549 m)   Wt 208 lb 3.2 oz (94.4 kg)   LMP  (LMP Unknown)   SpO2 95%   BMI 39.34 kg/m    Subjective:    Patient ID: Anne Bell, female    DOB: 12-06-1950, 71 y.o.   MRN: [id number]  HPI: Anne Bell is a 71 y.o. female  Chief Complaint  Patient presents with   Laryngitis    Patient is here for possible Laryngitis. Patient says she thought it was allergies when it first started. Patient says her throat isn't feeling any better. Patient denies having a fever or congestion. Patient says she is having pressure underneath her eyes and a lot of coughing that isn't helping the throat. Patient says phlegm says she is coughing up pinkish phlegm for a couple of days and then most of it has been clear.    UPPER RESPIRATORY TRACT INFECTION Started about 1 1/2 weeks ago with a hard cough and then turned into laryngitis.  Has been Covid vaccinated, did not test for Covid.    Has been taking Coricidin cough medicine and cold/flu -- she reports concern for urine infection due to decrease urine.  Fever: no Cough: yes Shortness of breath: no Wheezing: no Chest pain: no Chest tightness: no Chest congestion: no Nasal congestion: no Runny nose: no Post nasal drip: no Sneezing: no Sore throat: yes Swollen glands: no Sinus pressure: yes Headache: no Face pain: yes Toothache: no Ear pain: none Ear pressure: none Eyes red/itching:no Eye drainage/crusting: no  Vomiting: no Rash: no Fatigue: yes Sick contacts: no Strep contacts: no  Context: fluctuating Recurrent sinusitis: no Relief with OTC cold/cough medications: yes  Treatments attempted: Coricidin    Relevant past medical, surgical, family and social history reviewed and updated as indicated. Interim medical history since our last visit reviewed. Allergies and medications reviewed and updated.  Review of  Systems  Constitutional:  Positive for fatigue. Negative for activity change, appetite change, chills and fever.  HENT:  Positive for sinus pressure and voice change. Negative for congestion, ear discharge, ear pain, facial swelling, rhinorrhea, sinus pain, sneezing and sore throat.   Respiratory:  Positive for cough. Negative for chest tightness, shortness of breath and wheezing.   Cardiovascular:  Negative for chest pain, palpitations and leg swelling.  Gastrointestinal: Negative.   Endocrine: Negative.   Neurological: Negative.   Psychiatric/Behavioral: Negative.      Per HPI unless specifically indicated above     Objective:    BP 128/88 (BP Location: Left Arm, Patient Position: Sitting)   Pulse 62   Temp 98.3 F (36.8 C) (Oral)   Ht _1  (1.549 m)   Wt 208 lb 3.2 oz (94.4 kg)   LMP  (LMP Unknown)   SpO2 95%   BMI 39.34 kg/m   Wt Readings from Last 3 Encounters:  05/31/22 208 lb 3.2 oz (94.4 kg)  11/30/21 212 lb (96.2 kg)  11/19/21 212 lb 3.2 oz (96.3 kg)    Physical Exam Vitals and nursing note reviewed.  Constitutional:      General: She is awake. She is not in acute distress.    Appearance: She is well-developed and well-groomed. She is obese. She is not ill-appearing or toxic-appearing.  HENT:     Head: Normocephalic.     Right Ear: Hearing, ear canal and external  ear normal. No tenderness. A middle ear effusion is present.     Left Ear: Hearing, ear canal and external ear normal. No tenderness. A middle ear effusion is present.     Nose: No rhinorrhea.     Right Sinus: No maxillary sinus tenderness or frontal sinus tenderness.     Left Sinus: No maxillary sinus tenderness or frontal sinus tenderness.     Mouth/Throat:     Mouth: Mucous membranes are moist.     Pharynx: Posterior oropharyngeal erythema (mild with cobblestone appearance) present. No pharyngeal swelling or oropharyngeal exudate.     Comments: Laryngitis present.   Eyes:     General: Lids are  normal.        Right eye: No discharge.        Left eye: No discharge.     Conjunctiva/sclera: Conjunctivae normal.     Pupils: Pupils are equal, round, and reactive to light.  Neck:     Thyroid: No thyromegaly.     Vascular: No carotid bruit.  Cardiovascular:     Rate and Rhythm: Normal rate and regular rhythm.     Heart sounds: Normal heart sounds. No murmur heard.    No gallop.  Pulmonary:     Effort: Pulmonary effort is normal. No accessory muscle usage or respiratory distress.     Breath sounds: Normal breath sounds.  Abdominal:     General: Bowel sounds are normal. There is no distension.     Palpations: Abdomen is soft.     Tenderness: There is no abdominal tenderness. There is no right CVA tenderness or left CVA tenderness.  Musculoskeletal:     Cervical back: Normal range of motion and neck supple.     Right lower leg: No edema.     Left lower leg: No edema.  Lymphadenopathy:     Head:     Right side of head: No submental, submandibular, tonsillar, preauricular or posterior auricular adenopathy.     Left side of head: No submental, submandibular, tonsillar, preauricular or posterior auricular adenopathy.     Cervical: No cervical adenopathy.  Skin:    General: Skin is warm and dry.  Neurological:     Mental Status: She is alert and oriented to person, place, and time.  Psychiatric:        Attention and Perception: Attention normal.        Mood and Affect: Mood normal.        Speech: Speech normal.        Behavior: Behavior normal. Behavior is cooperative.        Thought Content: Thought content normal.     Results for orders placed or performed in visit on 11/19/21  Urine Culture   Specimen: Urine   UR  Result Value Ref Range   Urine Culture, Routine Final report (A)    Organism ID, Bacteria Escherichia coli (A)    ORGANISM ID, BACTERIA Comment    Antimicrobial Susceptibility Comment   Microscopic Examination   Urine  Result Value Ref Range   WBC, UA 6-10  (A) 0 - 5 /hpf   RBC, Urine 0-2 0 - 2 /hpf   Epithelial Cells (non renal) 0-10 0 - 10 /hpf   Mucus, UA Present (A) Not Estab.   Bacteria, UA Few (A) None seen/Few  Urinalysis, Routine w reflex microscopic  Result Value Ref Range   Specific Gravity, UA 1.015 1.005 - 1.030   pH, UA 7.0 5.0 - 7.5   Color, UA  Yellow Yellow   Appearance Ur Cloudy (A) Clear   Leukocytes,UA 2+ (A) Negative   Protein,UA Trace (A) Negative/Trace   Glucose, UA Negative Negative   Ketones, UA Negative Negative   RBC, UA Trace (A) Negative   Bilirubin, UA Negative Negative   Urobilinogen, Ur 0.2 0.2 - 1.0 mg/dL   Nitrite, UA Negative Negative   Microscopic Examination See below:   CBC with Differential/Platelet  Result Value Ref Range   WBC 10.1 3.4 - 10.8 x10E3/uL   RBC 4.65 3.77 - 5.28 x10E6/uL   Hemoglobin 14.3 11.1 - 15.9 g/dL   Hematocrit 43.6 34.0 - 46.6 %   MCV 94 79 - 97 fL   MCH 30.8 26.6 - 33.0 pg   MCHC 32.8 31.5 - 35.7 g/dL   RDW 12.4 11.7 - 15.4 %   Platelets 227 150 - 450 x10E3/uL   Neutrophils 77 Not Estab. %   Lymphs 13 Not Estab. %   Monocytes 7 Not Estab. %   Eos 2 Not Estab. %   Basos 0 Not Estab. %   Neutrophils Absolute 7.8 (H) 1.4 - 7.0 x10E3/uL   Lymphocytes Absolute 1.3 0.7 - 3.1 x10E3/uL   Monocytes Absolute 0.7 0.1 - 0.9 x10E3/uL   EOS (ABSOLUTE) 0.2 0.0 - 0.4 x10E3/uL   Basophils Absolute 0.0 0.0 - 0.2 x10E3/uL   Immature Granulocytes 1 Not Estab. %   Immature Grans (Abs) 0.1 0.0 - 0.1 x10E3/uL  Comprehensive metabolic panel  Result Value Ref Range   Glucose 103 (H) 70 - 99 mg/dL   BUN 15 8 - 27 mg/dL   Creatinine, Ser 0.64 0.57 - 1.00 mg/dL   eGFR 94 >59 mL/min/1.73   BUN/Creatinine Ratio 23 12 - 28   Sodium 141 134 - 144 mmol/L   Potassium 4.6 3.5 - 5.2 mmol/L   Chloride 102 96 - 106 mmol/L   CO2 25 20 - 29 mmol/L   Calcium 10.6 (H) 8.7 - 10.3 mg/dL   Total Protein 6.9 6.0 - 8.5 g/dL   Albumin 4.4 3.7 - 4.7 g/dL   Globulin, Total 2.5 1.5 - 4.5 g/dL    Albumin/Globulin Ratio 1.8 1.2 - 2.2   Bilirubin Total 0.5 0.0 - 1.2 mg/dL   Alkaline Phosphatase 97 44 - 121 IU/L   AST 16 0 - 40 IU/L   ALT 15 0 - 32 IU/L  Lipid Panel w/o Chol/HDL Ratio  Result Value Ref Range   Cholesterol, Total 139 100 - 199 mg/dL   Triglycerides 100 0 - 149 mg/dL   HDL 52 >39 mg/dL   VLDL Cholesterol Cal 18 5 - 40 mg/dL   LDL Chol Calc (NIH) 69 0 - 99 mg/dL  VITAMIN D 25 Hydroxy (Vit-D Deficiency, Fractures)  Result Value Ref Range   Vit D, 25-Hydroxy 45.1 30.0 - 100.0 ng/mL      Assessment & Plan:   Problem List Items Addressed This Visit       Other   Acute cough - Primary    Acute for 1 1/2 weeks with minimal improvement and laryngitis presenting.  Will send in Augmentin to take for 7 days and Diflucan for prophylaxis to prevent yeast with abx.  Recommend continue Coricidin at home for symptom management and salt water gargles.  Recommend: - Increased rest - Increasing Fluids - Acetaminophen as needed for fever/pain.  - Salt water gargling, chloraseptic spray and throat lozenges - Mucinex.  - Humidifying the air. Return to office as needed for worsening or ongoing  symptoms.      Urinary symptom or sign    Check UA today due to her concerns -- if UTI present then Augmentin should offer coverage, would send for culture too.      Relevant Orders   Urinalysis, Routine w reflex microscopic     Follow up plan: Return if symptoms worsen or fail to improve.

## 2022-05-31 NOTE — Patient Instructions (Signed)
Laryngitis  Laryngitis is irritation and swelling (inflammation) of your vocal cords. It causes your voice to sound hoarse and may cause you to lose your voice. Depending on the cause, this condition may go away after a short time or may last for more than 3 weeks. Treatment often involves resting your voice and using medicines to soothe your throat. What are the causes? Laryngitis that lasts for a short time may be caused by: An infection caused by a virus. Lots of talking, yelling, or singing. This is also called vocal strain. An infection caused by bacteria. Laryngitis that lasts for more than 3 weeks can be caused by: Lots of talking, yelling, or singing. An injury to the vocal cords. Acid reflux. Allergies. Sinus infection. Mucus draining from the nose down the throat (postnasal drip). Smoking. Drinking too much alcohol. Breathing in chemicals or dust. Having growths on the vocal cords. What increases the risk? Smoking. Drinking too much alcohol. Having allergies. Breathing in fumes at work. What are the signs or symptoms? A change in your voice. It may sound low and hoarse. Loss of voice. Dry cough. Sore throat. Dry throat. Stuffy nose. How is this treated? Treatment depends on what is causing the laryngitis. Usually, treatment includes: Resting your voice. Using medicines to soothe your throat. If your laryngitis is caused by an infection from bacteria, you may need to take antibiotics. If your laryngitis is caused by a growth on your vocal cords, you may need to have a surgery to remove it. Follow these instructions at home: Medicines Take over-the-counter and prescription medicines only as told by your doctor. If you were prescribed an antibiotic medicine, take it as told by your doctor. Do not stop taking it even if you start to feel better. Use throat lozenges or sprays to soothe your throat as told by your doctor. General instructions  Talk as little as  possible. To do this: Avoid whispering. Write instead of talking. Do this until your voice is back to normal. Rinse your mouth (gargle) with a salt water mixture 3-4 times a day or as needed. To make salt water, dissolve -1 tsp (3-6 g) of salt in 1 cup (237 mL) of warm water. Do not swallow this mixture. Drink enough fluid to keep your pee (urine) pale yellow. Breathe in moist air. Use a humidifier if you live in a dry climate. Do not smoke or use any products that contain nicotine or tobacco. If you need help quitting, ask your doctor. Contact a doctor if: You have a fever. Your pain is worse. Your symptoms do not get better in 2 weeks. Get help right away if: You cough up blood. You have trouble swallowing. You have trouble breathing. Summary Laryngitis is inflammation of your vocal cords. This condition causes your voice to sound low and hoarse. Rest your voice by talking as little as possible. Also avoid whispering. Get help right away if you have trouble swallowing or breathing or if you cough up blood. This information is not intended to replace advice given to you by your health care provider. Make sure you discuss any questions you have with your health care provider. Document Revised: 11/17/2020 Document Reviewed: 11/17/2020 Elsevier Patient Education  2023 [employer]  

## 2022-05-31 NOTE — Assessment & Plan Note (Signed)
Check UA today due to her concerns -- if UTI present then Augmentin should offer coverage, would send for culture too.

## 2022-06-01 LAB — URINALYSIS, ROUTINE W REFLEX MICROSCOPIC
Bilirubin, UA: NEGATIVE
Glucose, UA: NEGATIVE
Ketones, UA: NEGATIVE
Nitrite, UA: NEGATIVE
Protein,UA: NEGATIVE
RBC, UA: NEGATIVE
Specific Gravity, UA: 1.02 (ref 1.005–1.030)
Urobilinogen, Ur: 0.2 mg/dL (ref 0.2–1.0)
pH, UA: 7 (ref 5.0–7.5)

## 2022-06-01 LAB — MICROSCOPIC EXAMINATION: Bacteria, UA: NONE SEEN

## 2022-06-01 NOTE — Progress Notes (Signed)
Please let Anne Bell know her urine overall looks stable -- do not suspect infection present.

## 2022-06-08 ENCOUNTER — Encounter: Payer: Medicare Other | Admitting: Family Medicine

## 2022-06-12 ENCOUNTER — Other Ambulatory Visit: Payer: Self-pay | Admitting: Family Medicine

## 2022-06-12 DIAGNOSIS — I129 Hypertensive chronic kidney disease with stage 1 through stage 4 chronic kidney disease, or unspecified chronic kidney disease: Secondary | ICD-10-CM

## 2022-06-14 NOTE — Telephone Encounter (Signed)
Requested Prescriptions  Pending Prescriptions Disp Refills  . metoprolol succinate (TOPROL-XL) 100 MG 24 hr tablet [Pharmacy Med Name: Metoprolol Succinate ER 100 MG Oral Tablet Extended Release 24 Hour] 100 tablet 1    Sig: TAKE 1 TABLET BY MOUTH DAILY  WITH OR IMMEDIATELY FOLLOWING A  MEAL     Cardiovascular:  Beta Blockers Passed - 06/12/2022  5:16 AM      Passed - Last BP in normal range    BP Readings from Last 1 Encounters:  05/31/22 128/88         Passed - Last Heart Rate in normal range    Pulse Readings from Last 1 Encounters:  05/31/22 62         Passed - Valid encounter within last 6 months    Recent Outpatient Visits          2 weeks ago Acute cough   [place], [place] T, NP   6 months ago Facial injury, initial encounter   [place], Megan P, DO   6 months ago Benign hypertensive renal disease   [facility] [place], Megan P, DO   1 year ago Routine general medical examination at a health care facility   [facility], [place], DO   1 year ago Benign hypertensive renal disease   [facility] [place], Megan P, DO      Future Appointments            In 1 week [place], Megan P, DO [place]           . valsartan-hydrochlorothiazide (DIOVAN-HCT) 160-25 MG tablet [Pharmacy Med Name: Valsartan-hydroCHLOROthiazide 160-25 MG Oral Tablet] 100 tablet 2    Sig: TAKE 1 TABLET BY MOUTH DAILY     Cardiovascular: ARB + Diuretic Combos Failed - 06/12/2022  5:16 AM      Failed - K in normal range and within 180 days    Potassium  Date Value Ref Range Status  11/19/2021 4.6 3.5 - 5.2 mmol/L Final  10/10/2012 4.1 3.5 - 5.1 mmol/L Final         Failed - Na in normal range and within 180 days    Sodium  Date Value Ref Range Status  11/19/2021 141 134 - 144 mmol/L Final  10/10/2012 138 136 - 145 mmol/L Final         Failed - Cr in normal range and within 180  days    Creatinine  Date Value Ref Range Status  10/10/2012 0.79 0.60 - 1.30 mg/dL Final   Creatinine, Ser  Date Value Ref Range Status  11/19/2021 0.64 0.57 - 1.00 mg/dL Final         Failed - eGFR is 10 or above and within 180 days    EGFR (African American)  Date Value Ref Range Status  10/10/2012 >60  Final   GFR calc Af Amer  Date Value Ref Range Status  11/07/2020 92 >59 mL/min/1.73 Final    Comment:    **In accordance with recommendations from the NKF-ASN Task force,**   [facility] is in the process of updating its eGFR calculation to the   2021 CKD-EPI creatinine equation that estimates kidney function   without a race variable.    EGFR (Non-African Amer.)  Date Value Ref Range Status  10/10/2012 >60  Final    Comment:    eGFR values <11m/min/1.73 m2 may be an indication of chronic kidney disease (CKD). Calculated eGFR  is useful in patients with stable renal function. The eGFR calculation will not be reliable in acutely ill patients when serum creatinine is changing rapidly. It is not useful in  patients on dialysis. The eGFR calculation may not be applicable to patients at the low and high extremes of body sizes, pregnant women, and vegetarians.    GFR calc non Af Amer  Date Value Ref Range Status  11/07/2020 80 >59 mL/min/1.73 Final   eGFR  Date Value Ref Range Status  11/19/2021 94 >59 mL/min/1.73 Final         Passed - Patient is not pregnant      Passed - Last BP in normal range    BP Readings from Last 1 Encounters:  05/31/22 128/88         Passed - Valid encounter within last 6 months    Recent Outpatient Visits          2 weeks ago Acute cough   [place], Henrine Screws T, NP   6 months ago Facial injury, initial encounter   [place], [place], DO   6 months ago Benign hypertensive renal disease   [place], Megan P, DO   1 year ago Routine general medical examination at a  health care facility   [facility], [place], DO   1 year ago Benign hypertensive renal disease   [facility] Valerie Roys, DO      Future Appointments            In 1 week Wynetta Emery, Barb Merino, DO [facility], [facility]

## 2022-06-14 NOTE — Telephone Encounter (Signed)
Does she have enough to get to her appt in 10 days?

## 2022-06-14 NOTE — Telephone Encounter (Signed)
Requested medication (s) are due for refill today: unknown  Requested medication (s) are on the active medication list: yes  Last refill:  unknown  Future visit scheduled: 06/24/22, seen 05/31/22  Notes to clinic:  Historical Provider, please assess.       Requested Prescriptions  Pending Prescriptions Disp Refills   valsartan-hydrochlorothiazide (DIOVAN-HCT) 160-25 MG tablet [Pharmacy Med Name: Valsartan-hydroCHLOROthiazide 160-25 MG Oral Tablet] 100 tablet 2    Sig: TAKE 1 TABLET BY MOUTH DAILY     Cardiovascular: ARB + Diuretic Combos Failed - 06/12/2022  5:16 AM      Failed - K in normal range and within 180 days    Potassium  Date Value Ref Range Status  11/19/2021 4.6 3.5 - 5.2 mmol/L Final  10/10/2012 4.1 3.5 - 5.1 mmol/L Final         Failed - Na in normal range and within 180 days    Sodium  Date Value Ref Range Status  11/19/2021 141 134 - 144 mmol/L Final  10/10/2012 138 136 - 145 mmol/L Final         Failed - Cr in normal range and within 180 days    Creatinine  Date Value Ref Range Status  10/10/2012 0.79 0.60 - 1.30 mg/dL Final   Creatinine, Ser  Date Value Ref Range Status  11/19/2021 0.64 0.57 - 1.00 mg/dL Final         Failed - eGFR is 10 or above and within 180 days    EGFR (African American)  Date Value Ref Range Status  10/10/2012 >60  Final   GFR calc Af Amer  Date Value Ref Range Status  11/07/2020 92 >59 mL/min/1.73 Final    Comment:    **In accordance with recommendations from the NKF-ASN Task force,**   [facility] is in the process of updating its eGFR calculation to the   2021 CKD-EPI creatinine equation that estimates kidney function   without a race variable.    EGFR (Non-African Amer.)  Date Value Ref Range Status  10/10/2012 >60  Final    Comment:    eGFR values <28m/min/1.73 m2 may be an indication of chronic kidney disease (CKD). Calculated eGFR is useful in patients with stable renal function. The eGFR calculation will  not be reliable in acutely ill patients when serum creatinine is changing rapidly. It is not useful in  patients on dialysis. The eGFR calculation may not be applicable to patients at the low and high extremes of body sizes, pregnant women, and vegetarians.    GFR calc non Af Amer  Date Value Ref Range Status  11/07/2020 80 >59 mL/min/1.73 Final   eGFR  Date Value Ref Range Status  11/19/2021 94 >59 mL/min/1.73 Final         Passed - Patient is not pregnant      Passed - Last BP in normal range    BP Readings from Last 1 Encounters:  05/31/22 128/88         Passed - Valid encounter within last 6 months    Recent Outpatient Visits           2 weeks ago Acute cough   [place], NP   6 months ago Facial injury, initial encounter   [place]   6 months ago Benign hypertensive renal disease   [facility] JEl[place] Megan P, DO   1 year ago Routine general medical examination at a health care  facility   [place], [place] P, DO   1 year ago Benign hypertensive renal disease   [facility] [place], Megan P, DO       Future Appointments             In 1 week Wynetta Emery, Megan P, DO [facility], [facility]            Signed Prescriptions Disp Refills   metoprolol succinate (TOPROL-XL) 100 MG 24 hr tablet 100 tablet 1    Sig: TAKE 1 TABLET BY MOUTH DAILY  WITH OR IMMEDIATELY FOLLOWING A  MEAL     Cardiovascular:  Beta Blockers Passed - 06/12/2022  5:16 AM      Passed - Last BP in normal range    BP Readings from Last 1 Encounters:  05/31/22 128/88         Passed - Last Heart Rate in normal range    Pulse Readings from Last 1 Encounters:  05/31/22 62         Passed - Valid encounter within last 6 months    Recent Outpatient Visits           2 weeks ago Acute cough   [place], Henrine Screws T, NP   6 months ago Facial injury,  initial encounter   [place], [place], DO   6 months ago Benign hypertensive renal disease   [place], Megan P, DO   1 year ago Routine general medical examination at a health care facility   [facility], [place], DO   1 year ago Benign hypertensive renal disease   [facility] Valerie Roys, DO       Future Appointments             In 1 week Wynetta Emery, Barb Merino, DO [facility], [facility]

## 2022-06-24 ENCOUNTER — Encounter: Payer: Self-pay | Admitting: Family Medicine

## 2022-06-24 ENCOUNTER — Ambulatory Visit (INDEPENDENT_AMBULATORY_CARE_PROVIDER_SITE_OTHER): Payer: Medicare Other | Admitting: Family Medicine

## 2022-06-24 VITALS — BP 132/79 | HR 54 | Temp 98.2°F | Ht 61.0 in | Wt 205.7 lb

## 2022-06-24 DIAGNOSIS — D692 Other nonthrombocytopenic purpura: Secondary | ICD-10-CM | POA: Diagnosis not present

## 2022-06-24 DIAGNOSIS — R8281 Pyuria: Secondary | ICD-10-CM | POA: Diagnosis not present

## 2022-06-24 DIAGNOSIS — E7800 Pure hypercholesterolemia, unspecified: Secondary | ICD-10-CM

## 2022-06-24 DIAGNOSIS — F325 Major depressive disorder, single episode, in full remission: Secondary | ICD-10-CM

## 2022-06-24 DIAGNOSIS — I129 Hypertensive chronic kidney disease with stage 1 through stage 4 chronic kidney disease, or unspecified chronic kidney disease: Secondary | ICD-10-CM | POA: Diagnosis not present

## 2022-06-24 DIAGNOSIS — Z0000 Encounter for general adult medical examination without abnormal findings: Secondary | ICD-10-CM

## 2022-06-24 LAB — MICROALBUMIN, URINE WAIVED
Creatinine, Urine Waived: 50 mg/dL (ref 10–300)
Microalb, Ur Waived: 10 mg/L (ref 0–19)
Microalb/Creat Ratio: <30 mg/g (ref <30)

## 2022-06-24 LAB — URINALYSIS, ROUTINE W REFLEX MICROSCOPIC
Bilirubin, UA: NEGATIVE
Glucose, UA: NEGATIVE
Ketones, UA: NEGATIVE
Nitrite, UA: NEGATIVE
Protein,UA: NEGATIVE
RBC, UA: NEGATIVE
Specific Gravity, UA: 1.02 (ref 1.005–1.030)
Urobilinogen, Ur: 0.2 mg/dL (ref 0.2–1.0)
pH, UA: 7 (ref 5.0–7.5)

## 2022-06-24 LAB — MICROSCOPIC EXAMINATION: Bacteria, UA: NONE SEEN

## 2022-06-24 MED ORDER — OXYBUTYNIN CHLORIDE ER 10 MG PO TB24: 10.0000 mg | ORAL_TABLET | Freq: Every day | ORAL | 1 refills | Status: DC

## 2022-06-24 MED ORDER — ATORVASTATIN CALCIUM 20 MG PO TABS: 20.0000 mg | ORAL_TABLET | Freq: Every day | ORAL | 1 refills | Status: DC

## 2022-06-24 MED ORDER — METOPROLOL SUCCINATE ER 100 MG PO TB24: ORAL_TABLET | ORAL | 1 refills | Status: DC

## 2022-06-24 MED ORDER — AMLODIPINE BESYLATE 5 MG PO TABS: 5.0000 mg | ORAL_TABLET | Freq: Every day | ORAL | 1 refills | Status: DC

## 2022-06-24 MED ORDER — VALSARTAN-HYDROCHLOROTHIAZIDE 160-12.5 MG PO TABS: 1.0000 | ORAL_TABLET | Freq: Every day | ORAL | 1 refills | Status: DC

## 2022-06-24 NOTE — Assessment & Plan Note (Signed)
Reassured patient. Continue to monitor.  

## 2022-06-24 NOTE — Assessment & Plan Note (Signed)
Under good control on current regimen. Continue current regimen. Continue to monitor. Call with any concerns. Refills given. Labs drawn today.   

## 2022-06-24 NOTE — Assessment & Plan Note (Signed)
Doing well off medicine. Continue to monitor. Call with any concerns.  

## 2022-06-24 NOTE — Assessment & Plan Note (Signed)
Due to HTN and HLD. Encouraged diet and exercise with goal of losing 1-2lbs per week. Call with any concerns.

## 2022-06-24 NOTE — Progress Notes (Signed)
BP 132/79   Pulse (!) 54   Temp 98.2 F (36.8 C)   Ht 5\' 1"  (1.549 m)   Wt 205 lb 11.2 oz (93.3 kg)   LMP  (LMP Unknown)   SpO2 98%   BMI 38.87 kg/m    Subjective:    Patient ID: Anne Bell, female    DOB: [birth year], 71 y.o.   MRN: [id number]  HPI: Anne Bell is a 71 y.o. female presenting on 06/24/2022 for comprehensive medical examination. Current medical complaints include:  HYPERTENSION / HYPERLIPIDEMIA Satisfied with current treatment? yes Duration of hypertension: chronic BP monitoring frequency: not checking BP medication side effects: no Past BP meds: amlodipine, metoprolol, valsartan, HCTZ Duration of hyperlipidemia: chronic Cholesterol medication side effects: no Cholesterol supplements: none Past cholesterol medications: atorvastatin Medication compliance: excellent compliance Aspirin: no Recent stressors: no Recurrent headaches: no Visual changes: no Palpitations: no Dyspnea: no Chest pain: no Lower extremity edema: no Dizzy/lightheaded: no  Menopausal Symptoms: no  Depression Screen done today and results listed below:     06/24/2022    9:10 AM 05/31/2022    4:28 PM 02/17/2022   12:46 PM 12/01/2021    4:54 PM 11/19/2021   10:12 AM  Depression screen PHQ 2/9  Decreased Interest 0 0 0 1 0  Down, Depressed, Hopeless 0 0 0 1 0  PHQ - 2 Score 0 0 0 2 0  Altered sleeping 0 0  0 1  Tired, decreased energy 0 0  0 0  Change in appetite 0 0  0 0  Feeling bad or failure about yourself  0 0  0 0  Trouble concentrating 0 0  0 0  Moving slowly or fidgety/restless 0 0  0 0  Suicidal thoughts 0 0  0 0  PHQ-9 Score 0 0  2 1  Difficult doing work/chores Not difficult at all Not difficult at all  Not difficult at all     Past Medical History:  Past Medical History:  Diagnosis Date   Allergy    Depression    Hyperlipidemia    Hypertension     Surgical History:  Past Surgical History:  Procedure Laterality Date   BREAST CYST ASPIRATION  1970's    ? side   PLANTAR FASCIA SURGERY Left 2008    Medications:  Current Outpatient Medications on File Prior to Visit  Medication Sig   fluticasone (FLONASE) 50 MCG/ACT nasal spray USE 2 SPRAYS IN BOTH  NOSTRILS DAILY   Lutein 6 MG CAPS Take 6 mg by mouth daily.    MULTIPLE VITAMIN PO Take 1 tablet by mouth daily.    naproxen (NAPROSYN) 500 MG tablet Take 1 tablet (500 mg total) by mouth 2 (two) times daily with a meal. (Patient not taking: Reported on 06/24/2022)   No current facility-administered medications on file prior to visit.    Allergies:  Allergies  Allergen Reactions   Celecoxib     stomach cramps   Prednisone Other (See Comments)   Sulfa Antibiotics     Unknown allergy    Social History:  Social History   Socioeconomic History   Marital status: Divorced    Spouse name: Not on file   Number of children: Not on file   Years of education: Not on file   Highest education level: Some college, no degree  Occupational History   Occupation: Retired  Tobacco Use   Smoking status: Never   Smokeless tobacco: Never  Scientific laboratory technician  Use: Never used  Substance and Sexual Activity   Alcohol use: Yes    Comment: occasional   Drug use: No   Sexual activity: Yes    Birth control/protection: None  Other Topics Concern   Not on file  Social History Narrative   Not on file   Social Determinants of Health   Financial Resource Strain: Low Risk  (02/17/2022)   Overall Financial Resource Strain (CARDIA)    Difficulty of Paying Living Expenses: Not hard at all  Food Insecurity: No Food Insecurity (02/17/2022)   Hunger Vital Sign    Worried About Running Out of Food in the Last Year: Never true    Ran Out of Food in the Last Year: Never true  Transportation Needs: No Transportation Needs (02/17/2022)   PRAPARE - Administrator, Civil ServiceTransportation    Lack of Transportation (Medical): No    Lack of Transportation (Non-Medical): No  Physical Activity: Insufficiently Active (02/17/2022)    Exercise Vital Sign    Days of Exercise per Week: 3 days    Minutes of Exercise per Session: 40 min  Stress: No Stress Concern Present (02/16/2021)   [employer] - Occupational Stress Questionnaire    Feeling of Stress : Not at all  Social Connections: Socially Isolated (02/17/2022)   Social Connection and Isolation Panel [NHANES]    Frequency of Communication with Friends and Family: More than three times a week    Frequency of Social Gatherings with Friends and Family: More than three times a week    Attends Religious Services: Never    Database administratorActive Member of Clubs or Organizations: No    Attends BankerClub or Organization Meetings: Never    Marital Status: Divorced  Catering managerntimate Partner Violence: Not At Risk (02/17/2022)   Humiliation, Afraid, Rape, and Kick questionnaire    Fear of Current or Ex-Partner: No    Emotionally Abused: No    Physically Abused: No    Sexually Abused: No   Social History   Tobacco Use  Smoking Status Never  Smokeless Tobacco Never   Social History   Substance and Sexual Activity  Alcohol Use Yes   Comment: occasional    Family History:  Family History  Problem Relation Age of Onset   Alcohol abuse Father    Mental illness Mother        Depression   Cancer Mother        started in back    Brain cancer Sister    Breast cancer Neg Hx     Past medical history, surgical history, medications, allergies, family history and social history reviewed with patient today and changes made to appropriate areas of the chart.   Review of Systems  Constitutional: Negative.   HENT: Negative.    Eyes: Negative.   Respiratory: Negative.    Cardiovascular: Negative.   Gastrointestinal:  Positive for diarrhea. Negative for abdominal pain, blood in stool, constipation, heartburn, melena, nausea and vomiting.  Genitourinary: Negative.   Musculoskeletal: Negative.   Skin: Negative.   Neurological: Negative.   Endo/Heme/Allergies:  Positive for  environmental allergies. Negative for polydipsia. Bruises/bleeds easily.  Psychiatric/Behavioral: Negative.     All other ROS negative except what is listed above and in the HPI.      Objective:    BP 132/79   Pulse (!) 54   Temp 98.2 F (36.8 C)   Ht 5\' 1"  (1.549 m)   Wt 205 lb 11.2 oz (93.3 kg)   LMP  (LMP Unknown)  SpO2 98%   BMI 38.87 kg/m   Wt Readings from Last 3 Encounters:  06/24/22 205 lb 11.2 oz (93.3 kg)  05/31/22 208 lb 3.2 oz (94.4 kg)  11/30/21 212 lb (96.2 kg)    Physical Exam Vitals and nursing note reviewed.  Constitutional:      General: She is not in acute distress.    Appearance: Normal appearance. She is obese. She is not ill-appearing, toxic-appearing or diaphoretic.  HENT:     Head: Normocephalic and atraumatic.     Right Ear: Tympanic membrane, ear canal and external ear normal. There is no impacted cerumen.     Left Ear: Tympanic membrane, ear canal and external ear normal. There is no impacted cerumen.     Nose: Nose normal. No congestion or rhinorrhea.     Mouth/Throat:     Mouth: Mucous membranes are moist.     Pharynx: Oropharynx is clear. No oropharyngeal exudate or posterior oropharyngeal erythema.  Eyes:     General: No scleral icterus.       Right eye: No discharge.        Left eye: No discharge.     Extraocular Movements: Extraocular movements intact.     Conjunctiva/sclera: Conjunctivae normal.     Pupils: Pupils are equal, round, and reactive to light.  Neck:     Vascular: No carotid bruit.  Cardiovascular:     Rate and Rhythm: Normal rate and regular rhythm.     Pulses: Normal pulses.     Heart sounds: No murmur heard.    No friction rub. No gallop.  Pulmonary:     Effort: Pulmonary effort is normal. No respiratory distress.     Breath sounds: Normal breath sounds. No stridor. No wheezing, rhonchi or rales.  Chest:     Chest wall: No tenderness.  Abdominal:     General: Abdomen is flat. Bowel sounds are normal. There is no  distension.     Palpations: Abdomen is soft. There is no mass.     Tenderness: There is no abdominal tenderness. There is no right CVA tenderness, left CVA tenderness, guarding or rebound.     Hernia: No hernia is present.  Genitourinary:    Comments: Breast and pelvic exams deferred with shared decision making Musculoskeletal:        General: No swelling, tenderness, deformity or signs of injury.     Cervical back: Normal range of motion and neck supple. No rigidity. No muscular tenderness.     Right lower leg: No edema.     Left lower leg: No edema.  Lymphadenopathy:     Cervical: No cervical adenopathy.  Skin:    General: Skin is warm and dry.     Capillary Refill: Capillary refill takes less than 2 seconds.     Coloration: Skin is not jaundiced or pale.     Findings: No bruising, erythema, lesion or rash.  Neurological:     General: No focal deficit present.     Mental Status: She is alert and oriented to person, place, and time. Mental status is at baseline.     Cranial Nerves: No cranial nerve deficit.     Sensory: No sensory deficit.     Motor: No weakness.     Coordination: Coordination normal.     Gait: Gait normal.     Deep Tendon Reflexes: Reflexes normal.  Psychiatric:        Mood and Affect: Mood normal.  Behavior: Behavior normal.        Thought Content: Thought content normal.        Judgment: Judgment normal.     Results for orders placed or performed in visit on 06/24/22  Microscopic Examination   Urine  Result Value Ref Range   WBC, UA 0-5 0 - 5 /hpf   RBC, Urine 0-2 0 - 2 /hpf   Epithelial Cells (non renal) 0-10 0 - 10 /hpf   Bacteria, UA None seen None seen/Few  Urinalysis, Routine w reflex microscopic  Result Value Ref Range   Specific Gravity, UA 1.020 1.005 - 1.030   pH, UA 7.0 5.0 - 7.5   Color, UA Yellow Yellow   Appearance Ur Clear Clear   Leukocytes,UA Trace (A) Negative   Protein,UA Negative Negative/Trace   Glucose, UA Negative  Negative   Ketones, UA Negative Negative   RBC, UA Negative Negative   Bilirubin, UA Negative Negative   Urobilinogen, Ur 0.2 0.2 - 1.0 mg/dL   Nitrite, UA Negative Negative   Microscopic Examination See below:   Microalbumin, Urine Waived  Result Value Ref Range   Microalb, Ur Waived 10 0 - 19 mg/L   Creatinine, Urine Waived 50 10 - 300 mg/dL   Microalb/Creat Ratio <30 <30 mg/g      Assessment & Plan:   Problem List Items Addressed This Visit       Cardiovascular and Mediastinum   Senile purpura (HCC)    Reassured patient. Continue to monitor.       Relevant Medications   amLODipine (NORVASC) 5 MG tablet   atorvastatin (LIPITOR) 20 MG tablet   metoprolol succinate (TOPROL-XL) 100 MG 24 hr tablet   valsartan-hydrochlorothiazide (DIOVAN-HCT) 160-12.5 MG tablet     Genitourinary   Benign hypertensive renal disease    Under good control on current regimen. Continue current regimen. Continue to monitor. Call with any concerns. Refills given. Labs drawn today.        Relevant Medications   amLODipine (NORVASC) 5 MG tablet   metoprolol succinate (TOPROL-XL) 100 MG 24 hr tablet   Other Relevant Orders   CBC with Differential/Platelet   Comprehensive metabolic panel   Urinalysis, Routine w reflex microscopic (Completed)   TSH   Microalbumin, Urine Waived (Completed)     Other   Major depression in remission [facility])    Doing well off medicine. Continue to monitor. Call with any concerns.       Hypercholesteremia    Under good control on current regimen. Continue current regimen. Continue to monitor. Call with any concerns. Refills given. Labs drawn today.        Relevant Medications   amLODipine (NORVASC) 5 MG tablet   atorvastatin (LIPITOR) 20 MG tablet   metoprolol succinate (TOPROL-XL) 100 MG 24 hr tablet   valsartan-hydrochlorothiazide (DIOVAN-HCT) 160-12.5 MG tablet   Other Relevant Orders   Comprehensive metabolic panel   Lipid Panel w/o Chol/HDL Ratio    Morbid obesity (HCC)    Due to HTN and HLD. Encouraged diet and exercise with goal of losing 1-2lbs per week. Call with any concerns.       Other Visit Diagnoses     Routine general medical examination at a health care facility    -  Primary   Vaccines up to date. Screening labs checked today. Mammo, colonoscopy and DEXA up tod date. Pap N/A. Continue diet and exercise. Call with any concerns.    Pyuria       Will  check culture. Await results.    Relevant Orders   Urine Culture        Follow up plan: Return in about 6 months (around 12/24/2022).   LABORATORY TESTING:  - Pap smear: not applicable  IMMUNIZATIONS:   - Tdap: Tetanus vaccination status reviewed: last tetanus booster within 10 years. - Influenza: Up to date - Pneumovax: Up to date - Prevnar: Up to date - COVID: Up to date - HPV: Not applicable - Shingrix vaccine: Up to date  SCREENING: -Mammogram: Up to date  - Colonoscopy: Up to date  - Bone Density: Up to date   PATIENT COUNSELING:   Advised to take 1 mg of folate supplement per day if capable of pregnancy.   Sexuality: Discussed sexually transmitted diseases, partner selection, use of condoms, avoidance of unintended pregnancy  and contraceptive alternatives.   Advised to avoid cigarette smoking.  I discussed with the patient that most people either abstain from alcohol or drink within safe limits (<=14/week and <=4 drinks/occasion for males, <=7/weeks and <= 3 drinks/occasion for females) and that the risk for alcohol disorders and other health effects rises proportionally with the number of drinks per week and how often a drinker exceeds daily limits.  Discussed cessation/primary prevention of drug use and availability of treatment for abuse.   Diet: Encouraged to adjust caloric intake to maintain  or achieve ideal body weight, to reduce intake of dietary saturated fat and total fat, to limit sodium intake by avoiding high sodium foods and not adding  table salt, and to maintain adequate dietary potassium and calcium preferably from fresh fruits, vegetables, and low-fat dairy products.    stressed the importance of regular exercise  Injury prevention: Discussed safety belts, safety helmets, smoke detector, smoking near bedding or upholstery.   Dental health: Discussed importance of regular tooth brushing, flossing, and dental visits.    NEXT PREVENTATIVE PHYSICAL DUE IN 1 YEAR. Return in about 6 months (around 12/24/2022).

## 2022-06-25 ENCOUNTER — Encounter: Payer: Self-pay | Admitting: Family Medicine

## 2022-06-25 LAB — CBC WITH DIFFERENTIAL/PLATELET
Basophils Absolute: 0 10*3/uL (ref 0.0–0.2)
Basos: 0 %
EOS (ABSOLUTE): 0.2 10*3/uL (ref 0.0–0.4)
Eos: 3 %
Hematocrit: 43.2 % (ref 34.0–46.6)
Hemoglobin: 14.2 g/dL (ref 11.1–15.9)
Immature Grans (Abs): 0 10*3/uL (ref 0.0–0.1)
Immature Granulocytes: 0 %
Lymphocytes Absolute: 1.3 10*3/uL (ref 0.7–3.1)
Lymphs: 19 %
MCH: 31.3 pg (ref 26.6–33.0)
MCHC: 32.9 g/dL (ref 31.5–35.7)
MCV: 95 fL (ref 79–97)
Monocytes Absolute: 0.5 10*3/uL (ref 0.1–0.9)
Monocytes: 8 %
Neutrophils Absolute: 4.8 10*3/uL (ref 1.4–7.0)
Neutrophils: 70 %
Platelets: 177 10*3/uL (ref 150–450)
RBC: 4.54 x10E6/uL (ref 3.77–5.28)
RDW: 13 % (ref 11.7–15.4)
WBC: 6.8 10*3/uL (ref 3.4–10.8)

## 2022-06-25 LAB — LIPID PANEL W/O CHOL/HDL RATIO
Cholesterol, Total: 160 mg/dL (ref 100–199)
HDL: 52 mg/dL (ref >39)
LDL Chol Calc (NIH): 87 mg/dL (ref 0–99)
Triglycerides: 116 mg/dL (ref 0–149)
VLDL Cholesterol Cal: 21 mg/dL (ref 5–40)

## 2022-06-25 LAB — COMPREHENSIVE METABOLIC PANEL
ALT: 25 IU/L (ref 0–32)
AST: 24 IU/L (ref 0–40)
Albumin/Globulin Ratio: 1.8 (ref 1.2–2.2)
Albumin: 4.6 g/dL (ref 3.8–4.8)
Alkaline Phosphatase: 96 IU/L (ref 44–121)
BUN/Creatinine Ratio: 24 (ref 12–28)
BUN: 17 mg/dL (ref 8–27)
Bilirubin Total: 0.5 mg/dL (ref 0.0–1.2)
CO2: 27 mmol/L (ref 20–29)
Calcium: 10.9 mg/dL — ABNORMAL HIGH (ref 8.7–10.3)
Chloride: 99 mmol/L (ref 96–106)
Creatinine, Ser: 0.71 mg/dL (ref 0.57–1.00)
Globulin, Total: 2.5 g/dL (ref 1.5–4.5)
Glucose: 93 mg/dL (ref 70–99)
Potassium: 3.9 mmol/L (ref 3.5–5.2)
Sodium: 139 mmol/L (ref 134–144)
Total Protein: 7.1 g/dL (ref 6.0–8.5)
eGFR: 90 mL/min/{1.73_m2} (ref >59)

## 2022-06-25 LAB — TSH: TSH: 0.762 u[IU]/mL (ref 0.450–4.500)

## 2022-06-26 LAB — URINE CULTURE

## 2022-07-30 ENCOUNTER — Other Ambulatory Visit: Payer: Self-pay

## 2022-07-30 DIAGNOSIS — I129 Hypertensive chronic kidney disease with stage 1 through stage 4 chronic kidney disease, or unspecified chronic kidney disease: Secondary | ICD-10-CM

## 2022-07-30 DIAGNOSIS — H43812 Vitreous degeneration, left eye: Secondary | ICD-10-CM | POA: Diagnosis not present

## 2022-07-30 DIAGNOSIS — H2513 Age-related nuclear cataract, bilateral: Secondary | ICD-10-CM | POA: Diagnosis not present

## 2022-07-30 MED ORDER — METOPROLOL SUCCINATE ER 100 MG PO TB24: 100.0000 mg | ORAL_TABLET | Freq: Every day | ORAL | 1 refills | Status: DC

## 2022-07-30 NOTE — Telephone Encounter (Signed)
Pharmacy sent fax on clarification of frequency of administration for prescription. Please advise?

## 2022-09-10 DIAGNOSIS — H43812 Vitreous degeneration, left eye: Secondary | ICD-10-CM | POA: Diagnosis not present

## 2022-11-08 DIAGNOSIS — H00025 Hordeolum internum left lower eyelid: Secondary | ICD-10-CM | POA: Diagnosis not present

## 2022-11-08 DIAGNOSIS — I10 Essential (primary) hypertension: Secondary | ICD-10-CM | POA: Diagnosis not present

## 2022-12-24 ENCOUNTER — Ambulatory Visit (INDEPENDENT_AMBULATORY_CARE_PROVIDER_SITE_OTHER): Payer: Medicare Other | Admitting: Family Medicine

## 2022-12-24 ENCOUNTER — Encounter: Payer: Self-pay | Admitting: Family Medicine

## 2022-12-24 VITALS — BP 138/78 | HR 55 | Temp 98.3°F | Ht 61.0 in | Wt 202.6 lb

## 2022-12-24 DIAGNOSIS — F325 Major depressive disorder, single episode, in full remission: Secondary | ICD-10-CM | POA: Diagnosis not present

## 2022-12-24 DIAGNOSIS — E559 Vitamin D deficiency, unspecified: Secondary | ICD-10-CM | POA: Diagnosis not present

## 2022-12-24 DIAGNOSIS — R151 Fecal smearing: Secondary | ICD-10-CM

## 2022-12-24 DIAGNOSIS — D692 Other nonthrombocytopenic purpura: Secondary | ICD-10-CM | POA: Diagnosis not present

## 2022-12-24 DIAGNOSIS — E7800 Pure hypercholesterolemia, unspecified: Secondary | ICD-10-CM | POA: Diagnosis not present

## 2022-12-24 DIAGNOSIS — I129 Hypertensive chronic kidney disease with stage 1 through stage 4 chronic kidney disease, or unspecified chronic kidney disease: Secondary | ICD-10-CM

## 2022-12-24 MED ORDER — METOPROLOL SUCCINATE ER 100 MG PO TB24: 100.0000 mg | ORAL_TABLET | Freq: Every day | ORAL | 1 refills | Status: DC

## 2022-12-24 MED ORDER — AMLODIPINE BESYLATE 5 MG PO TABS: 5.0000 mg | ORAL_TABLET | Freq: Every day | ORAL | 1 refills | Status: DC

## 2022-12-24 MED ORDER — ATORVASTATIN CALCIUM 20 MG PO TABS: 20.0000 mg | ORAL_TABLET | Freq: Every day | ORAL | 1 refills | Status: DC

## 2022-12-24 MED ORDER — OXYBUTYNIN CHLORIDE ER 10 MG PO TB24: 10.0000 mg | ORAL_TABLET | Freq: Every day | ORAL | 1 refills | Status: DC

## 2022-12-24 MED ORDER — VALSARTAN-HYDROCHLOROTHIAZIDE 160-12.5 MG PO TABS: 1.0000 | ORAL_TABLET | Freq: Every day | ORAL | 1 refills | Status: DC

## 2022-12-24 NOTE — Assessment & Plan Note (Signed)
Doing well off medicine. Continue to monitor. Call with any concerns.  

## 2022-12-24 NOTE — Assessment & Plan Note (Signed)
Under good control on current regimen. Continue current regimen. Continue to monitor. Call with any concerns. Refills given. Labs drawn today.   

## 2022-12-24 NOTE — Assessment & Plan Note (Signed)
Labs drawn today. Await results. Treat as needed.  

## 2022-12-24 NOTE — Progress Notes (Signed)
BP 138/78   Pulse (!) 55   Temp 98.3 F (36.8 C) (Oral)   Ht 5\' 1"  (1.549 m)   Wt 202 lb 9.6 oz (91.9 kg)   LMP  (LMP Unknown)   SpO2 99%   BMI 38.28 kg/m    Subjective:    Patient ID: Anne Bell, female    DOB: 1951/04/01, 72 y.o.   MRN: [id number]  HPI: Anne Bell is a 72 y.o. female  Chief Complaint  Patient presents with   Depression   Hypertension   Hyperlipidemia   Finds that she has been having fecal smearing, has been having tacky stool. Some diarrhea, but no bleeding or discomfort.  HYPERTENSION / HYPERLIPIDEMIA Satisfied with current treatment? yes Duration of hypertension: chronic BP monitoring frequency: not checking BP medication side effects: no Past BP meds: vastartan, HCTZ, amlodipine Duration of hyperlipidemia: chronic Cholesterol medication side effects: no Cholesterol supplements: none Past cholesterol medications: atorvastatin Medication compliance: excellent compliance Aspirin: no Recent stressors: no Recurrent headaches: no Visual changes: no Palpitations: no Dyspnea: no Chest pain: no Lower extremity edema: no Dizzy/lightheaded: no  DEPRESSION Mood status: controlled Satisfied with current treatment?: yes Symptom severity: mild  Duration of current treatment : not currently on any medication Side effects: N/A Psychotherapy/counseling: no  Depressed mood: no Anxious mood: no Anhedonia: no Significant weight loss or gain: no Insomnia: no  Fatigue: no Feelings of worthlessness or guilt: no Impaired concentration/indecisiveness: no Suicidal ideations: no Hopelessness: no Crying spells: no    12/24/2022    9:54 AM 06/24/2022    9:10 AM 05/31/2022    4:28 PM 02/17/2022   12:46 PM 12/01/2021    4:54 PM  Depression screen PHQ 2/9  Decreased Interest 0 0 0 0 1  Down, Depressed, Hopeless 0 0 0 0 1  PHQ - 2 Score 0 0 0 0 2  Altered sleeping 0 0 0  0  Tired, decreased energy 0 0 0  0  Change in appetite 0 0 0  0  Feeling  bad or failure about yourself  0 0 0  0  Trouble concentrating 0 0 0  0  Moving slowly or fidgety/restless 0 0 0  0  Suicidal thoughts 0 0 0  0  PHQ-9 Score 0 0 0  2  Difficult doing work/chores Not difficult at all Not difficult at all Not difficult at all  Not difficult at all    Relevant past medical, surgical, family and social history reviewed and updated as indicated. Interim medical history since our last visit reviewed. Allergies and medications reviewed and updated.  Review of Systems  Constitutional: Negative.   Respiratory: Negative.    Cardiovascular: Negative.   Gastrointestinal: Negative.   Musculoskeletal:  Positive for arthralgias and myalgias. Negative for back pain, gait problem, joint swelling, neck pain and neck stiffness.  Skin: Negative.   Neurological: Negative.   Psychiatric/Behavioral: Negative.      Per HPI unless specifically indicated above     Objective:    BP 138/78   Pulse (!) 55   Temp 98.3 F (36.8 C) (Oral)   Ht 5\' 1"  (1.549 m)   Wt 202 lb 9.6 oz (91.9 kg)   LMP  (LMP Unknown)   SpO2 99%   BMI 38.28 kg/m   Wt Readings from Last 3 Encounters:  12/24/22 202 lb 9.6 oz (91.9 kg)  06/24/22 205 lb 11.2 oz (93.3 kg)  05/31/22 208 lb 3.2 oz (94.4 kg)  Physical Exam Vitals and nursing note reviewed.  Constitutional:      General: She is not in acute distress.    Appearance: Normal appearance. She is not ill-appearing, toxic-appearing or diaphoretic.  HENT:     Head: Normocephalic and atraumatic.     Right Ear: External ear normal.     Left Ear: External ear normal.     Nose: Nose normal.     Mouth/Throat:     Mouth: Mucous membranes are moist.     Pharynx: Oropharynx is clear.  Eyes:     General: No scleral icterus.       Right eye: No discharge.        Left eye: No discharge.     Extraocular Movements: Extraocular movements intact.     Conjunctiva/sclera: Conjunctivae normal.     Pupils: Pupils are equal, round, and reactive to  light.  Cardiovascular:     Rate and Rhythm: Normal rate and regular rhythm.     Pulses: Normal pulses.     Heart sounds: Normal heart sounds. No murmur heard.    No friction rub. No gallop.  Pulmonary:     Effort: Pulmonary effort is normal. No respiratory distress.     Breath sounds: Normal breath sounds. No stridor. No wheezing, rhonchi or rales.  Chest:     Chest wall: No tenderness.  Musculoskeletal:        General: Normal range of motion.     Cervical back: Normal range of motion and neck supple.  Skin:    General: Skin is warm and dry.     Capillary Refill: Capillary refill takes less than 2 seconds.     Coloration: Skin is not jaundiced or pale.     Findings: No bruising, erythema, lesion or rash.  Neurological:     General: No focal deficit present.     Mental Status: She is alert and oriented to person, place, and time. Mental status is at baseline.  Psychiatric:        Mood and Affect: Mood normal.        Behavior: Behavior normal.        Thought Content: Thought content normal.        Judgment: Judgment normal.     Results for orders placed or performed in visit on 06/24/22  Urine Culture   Specimen: Urine   UR  Result Value Ref Range   Urine Culture, Routine Final report    Organism ID, Bacteria Comment   Microscopic Examination   Urine  Result Value Ref Range   WBC, UA 0-5 0 - 5 /hpf   RBC, Urine 0-2 0 - 2 /hpf   Epithelial Cells (non renal) 0-10 0 - 10 /hpf   Bacteria, UA None seen None seen/Few  CBC with Differential/Platelet  Result Value Ref Range   WBC 6.8 3.4 - 10.8 x10E3/uL   RBC 4.54 3.77 - 5.28 x10E6/uL   Hemoglobin 14.2 11.1 - 15.9 g/dL   Hematocrit 78.2 95.6 - 46.6 %   MCV 95 79 - 97 fL   MCH 31.3 26.6 - 33.0 pg   MCHC 32.9 31.5 - 35.7 g/dL   RDW 21.3 08.6 - 57.8 %   Platelets 177 150 - 450 x10E3/uL   Neutrophils 70 Not Estab. %   Lymphs 19 Not Estab. %   Monocytes 8 Not Estab. %   Eos 3 Not Estab. %   Basos 0 Not Estab. %    Neutrophils Absolute 4.8 1.4 -  7.0 x10E3/uL   Lymphocytes Absolute 1.3 0.7 - 3.1 x10E3/uL   Monocytes Absolute 0.5 0.1 - 0.9 x10E3/uL   EOS (ABSOLUTE) 0.2 0.0 - 0.4 x10E3/uL   Basophils Absolute 0.0 0.0 - 0.2 x10E3/uL   Immature Granulocytes 0 Not Estab. %   Immature Grans (Abs) 0.0 0.0 - 0.1 x10E3/uL  Comprehensive metabolic panel  Result Value Ref Range   Glucose 93 70 - 99 mg/dL   BUN 17 8 - 27 mg/dL   Creatinine, Ser 0.27 0.57 - 1.00 mg/dL   eGFR 90 >74 JO/INO/6.76   BUN/Creatinine Ratio 24 12 - 28   Sodium 139 134 - 144 mmol/L   Potassium 3.9 3.5 - 5.2 mmol/L   Chloride 99 96 - 106 mmol/L   CO2 27 20 - 29 mmol/L   Calcium 10.9 (H) 8.7 - 10.3 mg/dL   Total Protein 7.1 6.0 - 8.5 g/dL   Albumin 4.6 3.8 - 4.8 g/dL   Globulin, Total 2.5 1.5 - 4.5 g/dL   Albumin/Globulin Ratio 1.8 1.2 - 2.2   Bilirubin Total 0.5 0.0 - 1.2 mg/dL   Alkaline Phosphatase 96 44 - 121 IU/L   AST 24 0 - 40 IU/L   ALT 25 0 - 32 IU/L  Lipid Panel w/o Chol/HDL Ratio  Result Value Ref Range   Cholesterol, Total 160 100 - 199 mg/dL   Triglycerides 720 0 - 149 mg/dL   HDL 52 >94 mg/dL   VLDL Cholesterol Cal 21 5 - 40 mg/dL   LDL Chol Calc (NIH) 87 0 - 99 mg/dL  Urinalysis, Routine w reflex microscopic  Result Value Ref Range   Specific Gravity, UA 1.020 1.005 - 1.030   pH, UA 7.0 5.0 - 7.5   Color, UA Yellow Yellow   Appearance Ur Clear Clear   Leukocytes,UA Trace (A) Negative   Protein,UA Negative Negative/Trace   Glucose, UA Negative Negative   Ketones, UA Negative Negative   RBC, UA Negative Negative   Bilirubin, UA Negative Negative   Urobilinogen, Ur 0.2 0.2 - 1.0 mg/dL   Nitrite, UA Negative Negative   Microscopic Examination See below:   TSH  Result Value Ref Range   TSH 0.762 0.450 - 4.500 uIU/mL  Microalbumin, Urine Waived  Result Value Ref Range   Microalb, Ur Waived 10 0 - 19 mg/L   Creatinine, Urine Waived 50 10 - 300 mg/dL   Microalb/Creat Ratio <30 <30 mg/g      Assessment &  Plan:   Problem List Items Addressed This Visit       Cardiovascular and Mediastinum   Senile purpura    Reassured patient. Continue to monitor.       Relevant Medications   amLODipine (NORVASC) 5 MG tablet   atorvastatin (LIPITOR) 20 MG tablet   metoprolol succinate (TOPROL-XL) 100 MG 24 hr tablet   valsartan-hydrochlorothiazide (DIOVAN-HCT) 160-12.5 MG tablet   Other Relevant Orders   CBC with Differential/Platelet   Comprehensive metabolic panel     Genitourinary   Benign hypertensive renal disease - Primary    Under good control on current regimen. Continue current regimen. Continue to monitor. Call with any concerns. Refills given. Labs drawn today.        Relevant Medications   amLODipine (NORVASC) 5 MG tablet   metoprolol succinate (TOPROL-XL) 100 MG 24 hr tablet   Other Relevant Orders   CBC with Differential/Platelet   Comprehensive metabolic panel     Other   Major depression in remission  Doing well off medicine. Continue to monitor. Call with any concerns.      Relevant Orders   CBC with Differential/Platelet   Comprehensive metabolic panel   Hypercholesteremia    Under good control on current regimen. Continue current regimen. Continue to monitor. Call with any concerns. Refills given. Labs drawn today.       Relevant Medications   amLODipine (NORVASC) 5 MG tablet   atorvastatin (LIPITOR) 20 MG tablet   metoprolol succinate (TOPROL-XL) 100 MG 24 hr tablet   valsartan-hydrochlorothiazide (DIOVAN-HCT) 160-12.5 MG tablet   Other Relevant Orders   CBC with Differential/Platelet   Lipid Panel w/o Chol/HDL Ratio   Comprehensive metabolic panel   Avitaminosis D    Labs drawn today. Await results. Treat as needed.       Relevant Orders   CBC with Differential/Platelet   VITAMIN D 25 Hydroxy (Vit-D Deficiency, Fractures)   Comprehensive metabolic panel   Other Visit Diagnoses     Fecal smearing       Will start her on fiber. Call if not getting  better in the next few weeks. Continue to monitor.        Follow up plan: Return in about 6 months (around 06/25/2023) for physical.

## 2022-12-24 NOTE — Assessment & Plan Note (Signed)
Reassured patient. Continue to monitor.  

## 2022-12-25 LAB — COMPREHENSIVE METABOLIC PANEL
ALT: 26 IU/L (ref 0–32)
AST: 27 IU/L (ref 0–40)
Albumin/Globulin Ratio: 1.7 (ref 1.2–2.2)
Albumin: 4.5 g/dL (ref 3.8–4.8)
Alkaline Phosphatase: 106 IU/L (ref 44–121)
BUN/Creatinine Ratio: 26 (ref 12–28)
BUN: 18 mg/dL (ref 8–27)
Bilirubin Total: 0.6 mg/dL (ref 0.0–1.2)
CO2: 24 mmol/L (ref 20–29)
Calcium: 11 mg/dL — ABNORMAL HIGH (ref 8.7–10.3)
Chloride: 101 mmol/L (ref 96–106)
Creatinine, Ser: 0.7 mg/dL (ref 0.57–1.00)
Globulin, Total: 2.7 g/dL (ref 1.5–4.5)
Glucose: 94 mg/dL (ref 70–99)
Potassium: 4.4 mmol/L (ref 3.5–5.2)
Sodium: 141 mmol/L (ref 134–144)
Total Protein: 7.2 g/dL (ref 6.0–8.5)
eGFR: 92 mL/min/{1.73_m2} (ref >59)

## 2022-12-25 LAB — CBC WITH DIFFERENTIAL/PLATELET
Basophils Absolute: 0 10*3/uL (ref 0.0–0.2)
Basos: 0 %
EOS (ABSOLUTE): 0.2 10*3/uL (ref 0.0–0.4)
Eos: 2 %
Hematocrit: 42.6 % (ref 34.0–46.6)
Hemoglobin: 14.2 g/dL (ref 11.1–15.9)
Immature Grans (Abs): 0 10*3/uL (ref 0.0–0.1)
Immature Granulocytes: 0 %
Lymphocytes Absolute: 1.4 10*3/uL (ref 0.7–3.1)
Lymphs: 19 %
MCH: 31.1 pg (ref 26.6–33.0)
MCHC: 33.3 g/dL (ref 31.5–35.7)
MCV: 93 fL (ref 79–97)
Monocytes Absolute: 0.5 10*3/uL (ref 0.1–0.9)
Monocytes: 7 %
Neutrophils Absolute: 5.1 10*3/uL (ref 1.4–7.0)
Neutrophils: 72 %
Platelets: 211 10*3/uL (ref 150–450)
RBC: 4.57 x10E6/uL (ref 3.77–5.28)
RDW: 12.7 % (ref 11.7–15.4)
WBC: 7.2 10*3/uL (ref 3.4–10.8)

## 2022-12-25 LAB — LIPID PANEL W/O CHOL/HDL RATIO
Cholesterol, Total: 157 mg/dL (ref 100–199)
HDL: 62 mg/dL (ref >39)
LDL Chol Calc (NIH): 76 mg/dL (ref 0–99)
Triglycerides: 105 mg/dL (ref 0–149)
VLDL Cholesterol Cal: 19 mg/dL (ref 5–40)

## 2022-12-25 LAB — VITAMIN D 25 HYDROXY (VIT D DEFICIENCY, FRACTURES): Vit D, 25-Hydroxy: 40.1 ng/mL (ref 30.0–100.0)

## 2022-12-27 ENCOUNTER — Encounter: Payer: Self-pay | Admitting: Family Medicine

## 2023-01-19 ENCOUNTER — Other Ambulatory Visit: Payer: Self-pay | Admitting: Family Medicine

## 2023-01-19 DIAGNOSIS — Z1231 Encounter for screening mammogram for malignant neoplasm of breast: Secondary | ICD-10-CM

## 2023-02-15 ENCOUNTER — Telehealth: Payer: Self-pay | Admitting: Family Medicine

## 2023-02-15 NOTE — Telephone Encounter (Signed)
Copied from CRM [id number]. Topic: Medicare AWV >> Feb 15, 2023  1:17 PM Payton Doughty wrote: Reason for CRM: LM 02/15/2023 to schedule AWV

## 2023-02-28 ENCOUNTER — Ambulatory Visit
Admission: RE | Admit: 2023-02-28 | Discharge: 2023-02-28 | Disposition: A | Discharge status: Home / Self Care | Payer: Medicare Other | Source: Ambulatory Visit | Attending: Family Medicine | Admitting: Family Medicine

## 2023-02-28 DIAGNOSIS — Z1231 Encounter for screening mammogram for malignant neoplasm of breast: Secondary | ICD-10-CM | POA: Insufficient documentation

## 2023-03-03 ENCOUNTER — Encounter: Payer: Self-pay | Admitting: Family Medicine

## 2023-03-08 ENCOUNTER — Ambulatory Visit (INDEPENDENT_AMBULATORY_CARE_PROVIDER_SITE_OTHER): Payer: Medicare Other

## 2023-03-08 VITALS — Ht 61.0 in | Wt 202.0 lb

## 2023-03-08 DIAGNOSIS — Z0000 Encounter for general adult medical examination without abnormal findings: Secondary | ICD-10-CM | POA: Diagnosis not present

## 2023-03-08 NOTE — Patient Instructions (Signed)
Anne Bell , Thank you for taking time to come for your Medicare Wellness Visit. I appreciate your ongoing commitment to your health goals. Please review the following plan we discussed and let me know if I can assist you in the future.   These are the goals we discussed:  Goals      DIET - EAT MORE FRUITS AND VEGETABLES     DIET - INCREASE WATER INTAKE     Recommend drinking at least 6-8 glasses of water a day      Patient Stated     02/16/2021, continue losing weight     Patient Stated     Continue current lifestyle        This is a list of the screening recommended for you and due dates:  Health Maintenance  Topic Date Due   Zoster (Shingles) Vaccine (2 of 2) 04/30/2021   COVID-19 Vaccine (5 - 2023-24 season) 08/10/2022   Flu Shot  04/14/2023   Mammogram  02/28/2024   Medicare Annual Wellness Visit  03/07/2024   Cologuard (Stool DNA test)  09/09/2024   DTaP/Tdap/Td vaccine (2 - Td or Tdap) 02/23/2026   Pneumonia Vaccine  Completed   DEXA scan (bone density measurement)  Completed   Hepatitis C Screening  Completed   HPV Vaccine  Aged Out    Advanced directives: no  Conditions/risks identified: none  Next appointment: Follow up in one year for your annual wellness visit 03/12/24 @ 2:00 pm by phone   Preventive Care 65 Years and Older, Female Preventive care refers to lifestyle choices and visits with your health care provider that can promote health and wellness. What does preventive care include? A yearly physical exam. This is also called an annual well check. Dental exams once or twice a year. Routine eye exams. Ask your health care provider how often you should have your eyes checked. Personal lifestyle choices, including: Daily care of your teeth and gums. Regular physical activity. Eating a healthy diet. Avoiding tobacco and drug use. Limiting alcohol use. Practicing safe sex. Taking low-dose aspirin every day. Taking vitamin and mineral supplements as  recommended by your health care provider. What happens during an annual well check? The services and screenings done by your health care provider during your annual well check will depend on your age, overall health, lifestyle risk factors, and family history of disease. Counseling  Your health care provider may ask you questions about your: Alcohol use. Tobacco use. Drug use. Emotional well-being. Home and relationship well-being. Sexual activity. Eating habits. History of falls. Memory and ability to understand (cognition). Work and work Astronomer. Reproductive health. Screening  You may have the following tests or measurements: Height, weight, and BMI. Blood pressure. Lipid and cholesterol levels. These may be checked every 5 years, or more frequently if you are over 11 years old. Skin check. Lung cancer screening. You may have this screening every year starting at age 70 if you have a 30-pack-year history of smoking and currently smoke or have quit within the past 15 years. Fecal occult blood test (FOBT) of the stool. You may have this test every year starting at age 56. Flexible sigmoidoscopy or colonoscopy. You may have a sigmoidoscopy every 5 years or a colonoscopy every 10 years starting at age 71. Hepatitis C blood test. Hepatitis B blood test. Sexually transmitted disease (STD) testing. Diabetes screening. This is done by checking your blood sugar (glucose) after you have not eaten for a while (fasting). You may have  this done every 1-3 years. Bone density scan. This is done to screen for osteoporosis. You may have this done starting at age 19. Mammogram. This may be done every 1-2 years. Talk to your health care provider about how often you should have regular mammograms. Talk with your health care provider about your test results, treatment options, and if necessary, the need for more tests. Vaccines  Your health care provider may recommend certain vaccines, such  as: Influenza vaccine. This is recommended every year. Tetanus, diphtheria, and acellular pertussis (Tdap, Td) vaccine. You may need a Td booster every 10 years. Zoster vaccine. You may need this after age 61. Pneumococcal 13-valent conjugate (PCV13) vaccine. One dose is recommended after age 16. Pneumococcal polysaccharide (PPSV23) vaccine. One dose is recommended after age 8. Talk to your health care provider about which screenings and vaccines you need and how often you need them. This information is not intended to replace advice given to you by your health care provider. Make sure you discuss any questions you have with your health care provider. Document Released: 09/26/2015 Document Revised: 05/19/2016 Document Reviewed: 07/01/2015 Elsevier Interactive Patient Education  2017 [place] Prevention in the Home Falls can cause injuries. They can happen to people of all ages. There are many things you can do to make your home safe and to help prevent falls. What can I do on the outside of my home? Regularly fix the edges of walkways and driveways and fix any cracks. Remove anything that might make you trip as you walk through a door, such as a raised step or threshold. Trim any bushes or trees on the path to your home. Use bright outdoor lighting. Clear any walking paths of anything that might make someone trip, such as rocks or tools. Regularly check to see if handrails are loose or broken. Make sure that both sides of any steps have handrails. Any raised decks and porches should have guardrails on the edges. Have any leaves, snow, or ice cleared regularly. Use sand or salt on walking paths during winter. Clean up any spills in your garage right away. This includes oil or grease spills. What can I do in the bathroom? Use night lights. Install grab bars by the toilet and in the tub and shower. Do not use towel bars as grab bars. Use non-skid mats or decals in the tub or  shower. If you need to sit down in the shower, use a plastic, non-slip stool. Keep the floor dry. Clean up any water that spills on the floor as soon as it happens. Remove soap buildup in the tub or shower regularly. Attach bath mats securely with double-sided non-slip rug tape. Do not have throw rugs and other things on the floor that can make you trip. What can I do in the bedroom? Use night lights. Make sure that you have a light by your bed that is easy to reach. Do not use any sheets or blankets that are too big for your bed. They should not hang down onto the floor. Have a firm chair that has side arms. You can use this for support while you get dressed. Do not have throw rugs and other things on the floor that can make you trip. What can I do in the kitchen? Clean up any spills right away. Avoid walking on wet floors. Keep items that you use a lot in easy-to-reach places. If you need to reach something above you, use a strong step stool  that has a grab bar. Keep electrical cords out of the way. Do not use floor polish or wax that makes floors slippery. If you must use wax, use non-skid floor wax. Do not have throw rugs and other things on the floor that can make you trip. What can I do with my stairs? Do not leave any items on the stairs. Make sure that there are handrails on both sides of the stairs and use them. Fix handrails that are broken or loose. Make sure that handrails are as long as the stairways. Check any carpeting to make sure that it is firmly attached to the stairs. Fix any carpet that is loose or worn. Avoid having throw rugs at the top or bottom of the stairs. If you do have throw rugs, attach them to the floor with carpet tape. Make sure that you have a light switch at the top of the stairs and the bottom of the stairs. If you do not have them, ask someone to add them for you. What else can I do to help prevent falls? Wear shoes that: Do not have high heels. Have  rubber bottoms. Are comfortable and fit you well. Are closed at the toe. Do not wear sandals. If you use a stepladder: Make sure that it is fully opened. Do not climb a closed stepladder. Make sure that both sides of the stepladder are locked into place. Ask someone to hold it for you, if possible. Clearly mark and make sure that you can see: Any grab bars or handrails. First and last steps. Where the edge of each step is. Use tools that help you move around (mobility aids) if they are needed. These include: Canes. Walkers. Scooters. Crutches. Turn on the lights when you go into a dark area. Replace any light bulbs as soon as they burn out. Set up your furniture so you have a clear path. Avoid moving your furniture around. If any of your floors are uneven, fix them. If there are any pets around you, be aware of where they are. Review your medicines with your doctor. Some medicines can make you feel dizzy. This can increase your chance of falling. Ask your doctor what other things that you can do to help prevent falls. This information is not intended to replace advice given to you by your health care provider. Make sure you discuss any questions you have with your health care provider. Document Released: 06/26/2009 Document Revised: 02/05/2016 Document Reviewed: 10/04/2014 Elsevier Interactive Patient Education  2017 [employer].

## 2023-03-08 NOTE — Progress Notes (Signed)
Subjective:   Anne Bell is a 72 y.o. female who presents for Medicare Annual (Subsequent) preventive examination.  Visit Complete: Virtual  I connected with  Anne Bell on 03/08/23 by a audio enabled telemedicine application and verified that I am speaking with the correct person using two identifiers.  Patient Location: Home  Provider Location: Office/Clinic  I discussed the limitations of evaluation and management by telemedicine. The patient expressed understanding and agreed to proceed.   Review of Systems     Cardiac Risk Factors include: obesity (BMI >30kg/m2)     Objective:    Today's Vitals   03/08/23 0901  Weight: 202 lb (91.6 kg)  Height: 5\' 1"  (1.549 m)   Body mass index is 38.17 kg/m.     03/08/2023    9:08 AM 02/17/2022   12:44 PM 02/16/2021    1:01 PM 02/13/2020   10:20 AM 02/22/2019   10:26 AM 01/09/2018    9:08 AM 12/28/2016    2:04 PM  Advanced Directives  Does Patient Have a Medical Advance Directive? No Yes Yes Yes No Yes Yes  Type of Science writer of [place];Living will Living will;Healthcare Power of [employer] Power of [place];Living will Healthcare Power of [place];Living will  Does patient want to make changes to medical advance directive?       No - Patient declined  Copy of Healthcare Power of Attorney in Chart?  No - copy requested No - copy requested No - copy requested  No - copy requested No - copy requested  Would patient like information on creating a medical advance directive? No - Patient declined          Current Medications (verified) Outpatient Encounter Medications as of 03/08/2023  Medication Sig   amLODipine (NORVASC) 5 MG tablet Take 1 tablet (5 mg total) by mouth daily.   atorvastatin (LIPITOR) 20 MG tablet Take 1 tablet (20 mg total) by mouth daily.   fluticasone (FLONASE) 50 MCG/ACT nasal spray USE 2 SPRAYS IN BOTH  NOSTRILS DAILY   Lutein 6 MG CAPS Take 6  mg by mouth daily.    metoprolol succinate (TOPROL-XL) 100 MG 24 hr tablet Take 1 tablet (100 mg total) by mouth daily. Take with or immediately following a meal.   MULTIPLE VITAMIN PO Take 1 tablet by mouth daily.    oxybutynin (DITROPAN-XL) 10 MG 24 hr tablet Take 1 tablet (10 mg total) by mouth at bedtime.   valsartan-hydrochlorothiazide (DIOVAN-HCT) 160-12.5 MG tablet Take 1 tablet by mouth daily.   No facility-administered encounter medications on file as of 03/08/2023.    Allergies (verified) Celecoxib, Prednisone, and Sulfa antibiotics   History: Past Medical History:  Diagnosis Date   Allergy    Depression    Hyperlipidemia    Hypertension    Past Surgical History:  Procedure Laterality Date   BREAST CYST ASPIRATION  1970's   ? side   PLANTAR FASCIA SURGERY Left 2008   Family History  Problem Relation Age of Onset   Alcohol abuse Father    Mental illness Mother        Depression   Cancer Mother        started in back    Brain cancer Sister    Breast cancer Neg Hx    Social History   Socioeconomic History   Marital status: Divorced    Spouse name: Not on file   Number of children: Not on  file   Years of education: Not on file   Highest education level: Some college, no degree  Occupational History   Occupation: Retired  Tobacco Use   Smoking status: Never   Smokeless tobacco: Never  Vaping Use   Vaping Use: Never used  Substance and Sexual Activity   Alcohol use: Yes    Comment: occasional   Drug use: No   Sexual activity: Yes    Birth control/protection: None  Other Topics Concern   Not on file  Social History Narrative   Not on file   Social Determinants of Health   Financial Resource Strain: Low Risk  (03/08/2023)   Overall Financial Resource Strain (CARDIA)    Difficulty of Paying Living Expenses: Not hard at all  Food Insecurity: No Food Insecurity (03/08/2023)   Hunger Vital Sign    Worried About Running Out of Food in the Last Year:  Never true    Ran Out of Food in the Last Year: Never true  Transportation Needs: No Transportation Needs (03/08/2023)   PRAPARE - Administrator, Civil Service (Medical): No    Lack of Transportation (Non-Medical): No  Physical Activity: Inactive (03/08/2023)   Exercise Vital Sign    Days of Exercise per Week: 0 days    Minutes of Exercise per Session: 0 min  Stress: No Stress Concern Present (03/08/2023)   [employer] of Occupational Health - Occupational Stress Questionnaire    Feeling of Stress : Only a little  Social Connections: Socially Isolated (03/08/2023)   Social Connection and Isolation Panel [NHANES]    Frequency of Communication with Friends and Family: More than three times a week    Frequency of Social Gatherings with Friends and Family: More than three times a week    Attends Religious Services: Never    Database administrator or Organizations: No    Attends Engineer, structural: Never    Marital Status: Divorced    Tobacco Counseling Counseling given: Not Answered   Clinical Intake:  Pre-visit preparation completed: Yes  Pain : No/denies pain     Nutritional Risks: None Diabetes: No  How often do you need to have someone help you when you read instructions, pamphlets, or other written materials from your doctor or pharmacy?: 1 - Never  Interpreter Needed?: No  Information entered by :: Kennedy Bucker, LPN   Activities of Daily Living    03/08/2023    9:02 AM 06/24/2022    9:10 AM  In your present state of health, do you have any difficulty performing the following activities:  Hearing? 0 0  Vision? 0 0  Difficulty concentrating or making decisions? 0 0  Walking or climbing stairs? 1 0  Comment uses cane   Dressing or bathing? 0 0  Doing errands, shopping? 0 0  Preparing Food and eating ? N   Using the Toilet? N   In the past six months, have you accidently leaked urine? N   Do you have problems with loss of bowel  control? N   Managing your Medications? N   Managing your Finances? N   Housekeeping or managing your Housekeeping? N     Patient Care Team: Dorcas Carrow, DO as PCP - General (Family Medicine)  Indicate any recent Medical Services you may have received from other than [facility] providers in the past year (date may be approximate).     Assessment:   This is a routine wellness examination for Naquisha.  Hearing/Vision screen Hearing Screening - Comments:: No aids  Vision Screening - Comments:: Wears glasses- [facility]  Dietary issues and exercise activities discussed:     Goals Addressed             This Visit's Progress    DIET - EAT MORE FRUITS AND VEGETABLES         Depression Screen    03/08/2023    9:10 AM 03/08/2023    9:07 AM 12/24/2022    9:54 AM 06/24/2022    9:10 AM 05/31/2022    4:28 PM 02/17/2022   12:46 PM 12/01/2021    4:54 PM  PHQ 2/9 Scores  PHQ - 2 Score 0 0 0 0 0 0 2  PHQ- 9 Score 0 0 0 0 0  2    Fall Risk    03/08/2023    9:09 AM 12/24/2022    9:54 AM 06/24/2022    9:10 AM 05/31/2022    4:28 PM 02/17/2022   12:39 PM  Fall Risk   Falls in the past year? 1 0 1 1 1   Number falls in past yr: 0 0 0 0 1  Injury with Fall? 0 0 0 1 0  Risk for fall due to : History of fall(s) No Fall Risks No Fall Risks History of fall(s)   Follow up Falls prevention discussed;Falls evaluation completed Falls evaluation completed Falls evaluation completed Falls evaluation completed Falls evaluation completed;Education provided;Falls prevention discussed    MEDICARE RISK AT HOME:  Medicare Risk at Home - 03/08/23 0909     Any stairs in or around the home? Yes    If so, are there any without handrails? No    Home free of loose throw rugs in walkways, pet beds, electrical cords, etc? Yes    Adequate lighting in your home to reduce risk of falls? Yes    Life alert? No    Use of a cane, walker or w/c? Yes   cane- occasionally   Grab bars in the bathroom? Yes    Shower  chair or bench in shower? No    Elevated toilet seat or a handicapped toilet? Yes             TIMED UP AND GO:  Was the test performed?  No    Cognitive Function:        03/08/2023    9:11 AM 02/17/2022   12:41 PM 02/16/2021    1:03 PM 02/22/2019   10:26 AM 01/09/2018    9:08 AM  6CIT Screen  What Year? 0 points 0 points 0 points 0 points 0 points  What month? 0 points 0 points 0 points 0 points 0 points  What time? 0 points 0 points 0 points 0 points 0 points  Count back from 20 0 points 0 points 0 points 0 points 0 points  Months in reverse 0 points 0 points 0 points 0 points 0 points  Repeat phrase 0 points 0 points 2 points 0 points 0 points  Total Score 0 points 0 points 2 points 0 points 0 points    Immunizations Immunization History  Administered Date(s) Administered   Fluad Quad(high Dose 65+) 07/18/2021, 06/14/2022   Influenza Whole 06/13/2017   Influenza, High Dose Seasonal PF 06/20/2018, 06/13/2020   Influenza-Unspecified 05/31/2015, 06/13/2017, 06/12/2019   Moderna SARS-COV2 Booster Vaccination 02/08/2021   Moderna Sars-Covid-2 Vaccination 06/15/2022   PFIZER(Purple Top)SARS-COV-2 Vaccination 11/09/2019, 12/04/2019   Pneumococcal Conjugate-13 05/31/2015   Pneumococcal Polysaccharide-23 12/28/2016  Tdap 02/24/2016   Zoster Recombinat (Shingrix) 03/05/2021   Zoster, Live 05/28/2014    TDAP status: Up to date  Flu Vaccine status: Up to date  Pneumococcal vaccine status: Up to date  Covid-19 vaccine status: Completed vaccines  Qualifies for Shingles Vaccine? Yes   Zostavax completed Yes   Shingrix Completed?: No.    Education has been provided regarding the importance of this vaccine. Patient has been advised to call insurance company to determine out of pocket expense if they have not yet received this vaccine. Advised may also receive vaccine at local pharmacy or Health Dept. Verbalized acceptance and understanding.  Screening Tests Health  Maintenance  Topic Date Due   Zoster Vaccines- Shingrix (2 of 2) 04/30/2021   COVID-19 Vaccine (5 - 2023-24 season) 08/10/2022   INFLUENZA VACCINE  04/14/2023   MAMMOGRAM  02/28/2024   Medicare Annual Wellness (AWV)  03/07/2024   Fecal DNA (Cologuard)  09/09/2024   DTaP/Tdap/Td (2 - Td or Tdap) 02/23/2026   Pneumonia Vaccine 1+ Years old  Completed   DEXA SCAN  Completed   Hepatitis C Screening  Completed   HPV VACCINES  Aged Out   Had 1st Shingrix 03/05/21 Health Maintenance  Health Maintenance Due  Topic Date Due   Zoster Vaccines- Shingrix (2 of 2) 04/30/2021   COVID-19 Vaccine (5 - 2023-24 season) 08/10/2022    Colorectal cancer screening: Type of screening: Cologuard. Completed 09/09/21. Repeat every 3 years  Mammogram status: Completed 02/28/23. Repeat every year  Bone Density status: Completed 02/23/22. Results reflect: Bone density results: OSTEOPENIA. Repeat every 5 years.  Lung Cancer Screening: (Low Dose CT Chest recommended if Age 78-80 years, 20 pack-year currently smoking OR have quit w/in 15years.) does not qualify.    Additional Screening:  Hepatitis C Screening: does qualify; Completed 08/21/15  Vision Screening: Recommended annual ophthalmology exams for early detection of glaucoma and other disorders of the eye. Is the patient up to date with their annual eye exam?  Yes  Who is the provider or what is the name of the office in which the patient attends annual eye exams? [facility] If pt is not established with a provider, would they like to be referred to a provider to establish care? No .   Dental Screening: Recommended annual dental exams for proper oral hygiene  Community Resource Referral / Chronic Care Management: CRR required this visit?  No   CCM required this visit?  No     Plan:     I have personally reviewed and noted the following in the patient's chart:   Medical and social history Use of alcohol, tobacco or illicit drugs  Current  medications and supplements including opioid prescriptions. Patient is not currently taking opioid prescriptions. Functional ability and status Nutritional status Physical activity Advanced directives List of other physicians Hospitalizations, surgeries, and ER visits in previous 12 months Vitals Screenings to include cognitive, depression, and falls Referrals and appointments  In addition, I have reviewed and discussed with patient certain preventive protocols, quality metrics, and best practice recommendations. A written personalized care plan for preventive services as well as general preventive health recommendations were provided to patient.     Hal Hope, LPN   1/61/0960   After Visit Summary: (MyChart) Due to this being a telephonic visit, the after visit summary with patients personalized plan was offered to patient via MyChart   Nurse Notes: none

## 2023-03-28 ENCOUNTER — Other Ambulatory Visit: Payer: Self-pay | Admitting: Family Medicine

## 2023-03-28 DIAGNOSIS — J301 Allergic rhinitis due to pollen: Secondary | ICD-10-CM

## 2023-03-29 NOTE — Telephone Encounter (Signed)
Requested Prescriptions  Pending Prescriptions Disp Refills   fluticasone (FLONASE) 50 MCG/ACT nasal spray [Pharmacy Med Name: Fluticasone Propionate 50 MCG/ACT Nasal Suspension] 48 g 3    Sig: USE 2 SPRAYS IN BOTH NOSTRILS  DAILY     Ear, Nose, and Throat: Nasal Preparations - Corticosteroids Passed - 03/28/2023 11:08 AM      Passed - Valid encounter within last 12 months    Recent Outpatient Visits           3 months ago Benign hypertensive renal disease   [place] [facility] [place], Megan P, DO   9 months ago Routine general medical examination at a health care facility   [facility], Megan P, DO   10 months ago Acute cough   [place] [facility] [place], Corrie Dandy T, NP   1 year ago Facial injury, initial encounter   [place] [facility] [place], [place], DO   1 year ago Benign hypertensive renal disease   [place] [facility] Dorcas Carrow, DO       Future Appointments             In 3 months Laural Benes, Oralia Rud, DO [facility]

## 2023-06-09 ENCOUNTER — Ambulatory Visit: Payer: Medicare Other | Admitting: Family Medicine

## 2023-06-22 ENCOUNTER — Telehealth: Payer: Self-pay | Admitting: Family Medicine

## 2023-06-22 NOTE — Telephone Encounter (Signed)
Copied from CRM [id number]. Topic: Appointment Scheduling - Scheduling Inquiry for Clinic >> Jun 22, 2023  9:05 AM Everette C wrote: Reason for CRM: The patient would like an injection for their left knee at their upcoming appt on 06/27/23  Please contact further when possible

## 2023-06-26 NOTE — Telephone Encounter (Signed)
We can discuss, but she has not had an x-ray of her knee, so we will likely need to start with that

## 2023-06-27 ENCOUNTER — Encounter: Payer: Self-pay | Admitting: Family Medicine

## 2023-06-27 ENCOUNTER — Ambulatory Visit (INDEPENDENT_AMBULATORY_CARE_PROVIDER_SITE_OTHER): Payer: Medicare Other | Admitting: Family Medicine

## 2023-06-27 VITALS — BP 148/80 | HR 55 | Ht 61.0 in | Wt 201.0 lb

## 2023-06-27 DIAGNOSIS — E7800 Pure hypercholesterolemia, unspecified: Secondary | ICD-10-CM

## 2023-06-27 DIAGNOSIS — M1712 Unilateral primary osteoarthritis, left knee: Secondary | ICD-10-CM | POA: Diagnosis not present

## 2023-06-27 DIAGNOSIS — D692 Other nonthrombocytopenic purpura: Secondary | ICD-10-CM | POA: Diagnosis not present

## 2023-06-27 DIAGNOSIS — I129 Hypertensive chronic kidney disease with stage 1 through stage 4 chronic kidney disease, or unspecified chronic kidney disease: Secondary | ICD-10-CM

## 2023-06-27 DIAGNOSIS — F325 Major depressive disorder, single episode, in full remission: Secondary | ICD-10-CM | POA: Diagnosis not present

## 2023-06-27 DIAGNOSIS — E559 Vitamin D deficiency, unspecified: Secondary | ICD-10-CM | POA: Diagnosis not present

## 2023-06-27 DIAGNOSIS — M25562 Pain in left knee: Secondary | ICD-10-CM | POA: Diagnosis not present

## 2023-06-27 DIAGNOSIS — G8929 Other chronic pain: Secondary | ICD-10-CM | POA: Diagnosis not present

## 2023-06-27 DIAGNOSIS — M2342 Loose body in knee, left knee: Secondary | ICD-10-CM | POA: Diagnosis not present

## 2023-06-27 DIAGNOSIS — Z0000 Encounter for general adult medical examination without abnormal findings: Secondary | ICD-10-CM

## 2023-06-27 MED ORDER — METOPROLOL SUCCINATE ER 100 MG PO TB24
100.0000 mg | ORAL_TABLET | Freq: Every day | ORAL | 1 refills | Status: DC
Start: 2023-06-27 — End: 2023-12-26

## 2023-06-27 MED ORDER — AMLODIPINE BESYLATE 5 MG PO TABS: 5.0000 mg | ORAL_TABLET | Freq: Every day | ORAL | 1 refills | Status: DC

## 2023-06-27 MED ORDER — ATORVASTATIN CALCIUM 20 MG PO TABS: 20.0000 mg | ORAL_TABLET | Freq: Every day | ORAL | 1 refills | Status: DC

## 2023-06-27 MED ORDER — HYDROXYZINE PAMOATE 25 MG PO CAPS
25.0000 mg | ORAL_CAPSULE | Freq: Three times a day (TID) | ORAL | 1 refills | Status: DC | PRN
Start: 2023-06-27 — End: 2023-12-26

## 2023-06-27 MED ORDER — VALSARTAN-HYDROCHLOROTHIAZIDE 160-12.5 MG PO TABS: 1.0000 | ORAL_TABLET | Freq: Every day | ORAL | 1 refills | Status: DC

## 2023-06-27 MED ORDER — OXYBUTYNIN CHLORIDE ER 10 MG PO TB24: 10.0000 mg | ORAL_TABLET | Freq: Every day | ORAL | 1 refills | Status: DC

## 2023-06-27 NOTE — Assessment & Plan Note (Signed)
Stable.       - Continue to monitor

## 2023-06-27 NOTE — Assessment & Plan Note (Signed)
Encouraged diet and exercise with goal of losing 1-2lbs per week. Call with any concerns.  

## 2023-06-27 NOTE — Assessment & Plan Note (Signed)
Under good control on current regimen. Continue current regimen. Continue to monitor. Call with any concerns. Refills given. Labs drawn today.   

## 2023-06-27 NOTE — Assessment & Plan Note (Signed)
Acting up with her sister's illness- will start hydroxyzine and monitor. May need to restart zoloft.

## 2023-06-27 NOTE — Assessment & Plan Note (Signed)
Running a little high, possibly due to stress and pain. Will monitor and recheck next visit.

## 2023-06-27 NOTE — Progress Notes (Signed)
BP (!) 148/80   Pulse (!) 55   Ht 5\' 1"  (1.549 m)   Wt 201 lb (91.2 kg)   LMP  (LMP Unknown)   SpO2 98%   BMI 37.98 kg/m    Subjective:    Patient ID: Anne Bell, female    DOB: 1951-09-06, 72 y.o.   MRN: [id number]  HPI: Anne Bell is a 72 y.o. female presenting on 06/27/2023 for comprehensive medical examination. Current medical complaints include:  KNEE PAIN Duration: months Involved knee: left Mechanism of injury: unknown Location:diffuse Onset: gradual Severity: moderate  Quality:  aching and sore Frequency: intermittent Radiation: no Aggravating factors: walking, running, stairs, bending, and movement  Alleviating factors: nothing  Status: worse Treatments attempted: rest, ice, heat, APAP, ibuprofen, and aleve  Relief with NSAIDs?:  mild Weakness with weight bearing or walking: no Sensation of giving way: no Locking: no Popping: no Bruising: no Swelling: no Redness: no Paresthesias/decreased sensation: no Fevers: no  ANXIETY/DEPRESSION- Sister's cancer is back and she has been told it doesn't look good Duration: chronic Status:exacerbated Anxious mood: yes  Excessive worrying: yes Irritability: no  Sweating: no Nausea: no Palpitations:no Hyperventilation: no Panic attacks: no Agoraphobia: no  Obscessions/compulsions: no Depressed mood: yes    06/27/2023    8:49 AM 03/08/2023    9:10 AM 03/08/2023    9:07 AM 12/24/2022    9:54 AM 06/24/2022    9:10 AM  Depression screen PHQ 2/9  Decreased Interest 0 0 0 0 0  Down, Depressed, Hopeless 0 0 0 0 0  PHQ - 2 Score 0 0 0 0 0  Altered sleeping 0 0 0 0 0  Tired, decreased energy 0 0 0 0 0  Change in appetite 0 0 0 0 0  Feeling bad or failure about yourself  0 0 0 0 0  Trouble concentrating 0 0 0 0 0  Moving slowly or fidgety/restless 0 0 0 0 0  Suicidal thoughts 0 0 0 0 0  PHQ-9 Score 0 0 0 0 0  Difficult doing work/chores Not difficult at all Not difficult at all Not difficult at all Not  difficult at all Not difficult at all   Anhedonia: no Weight changes: no Insomnia: no   Hypersomnia: no Fatigue/loss of energy: yes Feelings of worthlessness: no Feelings of guilt: no Impaired concentration/indecisiveness: no Suicidal ideations: no  Crying spells: no Recent Stressors/Life Changes: no   Relationship problems: no   Family stress: yes     Financial stress: no    Job stress: no    Recent death/loss: no  HYPERTENSION / HYPERLIPIDEMIA Satisfied with current treatment? yes Duration of hypertension: chronic BP monitoring frequency: rarely BP medication side effects: no Past BP meds: amlodipine, valsartan, hydrochlorothiazide, metoprolol Duration of hyperlipidemia: chronic Cholesterol medication side effects: no Cholesterol supplements: none Past cholesterol medications: atorvastatin Medication compliance: excellent compliance Aspirin: no Recent stressors: yes Recurrent headaches: no Visual changes: no Palpitations: no Dyspnea: no Chest pain: no Lower extremity edema: no Dizzy/lightheaded: no  She currently lives with: alone Menopausal Symptoms: no  Depression Screen done today and results listed below:     06/27/2023    8:49 AM 03/08/2023    9:10 AM 03/08/2023    9:07 AM 12/24/2022    9:54 AM 06/24/2022    9:10 AM  Depression screen PHQ 2/9  Decreased Interest 0 0 0 0 0  Down, Depressed, Hopeless 0 0 0 0 0  PHQ - 2 Score 0 0 0 0  0  Altered sleeping 0 0 0 0 0  Tired, decreased energy 0 0 0 0 0  Change in appetite 0 0 0 0 0  Feeling bad or failure about yourself  0 0 0 0 0  Trouble concentrating 0 0 0 0 0  Moving slowly or fidgety/restless 0 0 0 0 0  Suicidal thoughts 0 0 0 0 0  PHQ-9 Score 0 0 0 0 0  Difficult doing work/chores Not difficult at all Not difficult at all Not difficult at all Not difficult at all Not difficult at all    Past Medical History:  Past Medical History:  Diagnosis Date   Allergy    Depression    Hyperlipidemia     Hypertension     Surgical History:  Past Surgical History:  Procedure Laterality Date   BREAST CYST ASPIRATION  1970's   ? side   PLANTAR FASCIA SURGERY Left 2008    Medications:  Current Outpatient Medications on File Prior to Visit  Medication Sig   fluticasone (FLONASE) 50 MCG/ACT nasal spray USE 2 SPRAYS IN BOTH NOSTRILS  DAILY   Lutein 6 MG CAPS Take 6 mg by mouth daily.    MULTIPLE VITAMIN PO Take 1 tablet by mouth daily.    No current facility-administered medications on file prior to visit.    Allergies:  Allergies  Allergen Reactions   Celecoxib     stomach cramps   Prednisone Other (See Comments)   Sulfa Antibiotics     Unknown allergy    Social History:  Social History   Socioeconomic History   Marital status: Divorced    Spouse name: Not on file   Number of children: Not on file   Years of education: Not on file   Highest education level: Some college, no degree  Occupational History   Occupation: Retired  Tobacco Use   Smoking status: Never   Smokeless tobacco: Never  Vaping Use   Vaping status: Never Used  Substance and Sexual Activity   Alcohol use: Yes    Comment: occasional   Drug use: No   Sexual activity: Yes    Birth control/protection: None  Other Topics Concern   Not on file  Social History Narrative   Not on file   Social Determinants of Health   Financial Resource Strain: Low Risk  (03/08/2023)   Overall Financial Resource Strain (CARDIA)    Difficulty of Paying Living Expenses: Not hard at all  Food Insecurity: No Food Insecurity (03/08/2023)   Hunger Vital Sign    Worried About Running Out of Food in the Last Year: Never true    Ran Out of Food in the Last Year: Never true  Transportation Needs: No Transportation Needs (03/08/2023)   PRAPARE - Administrator, Civil Service (Medical): No    Lack of Transportation (Non-Medical): No  Physical Activity: Inactive (03/08/2023)   Exercise Vital Sign    Days of Exercise  per Week: 0 days    Minutes of Exercise per Session: 0 min  Stress: No Stress Concern Present (03/08/2023)   [employer] of Occupational Health - Occupational Stress Questionnaire    Feeling of Stress : Only a little  Social Connections: Socially Isolated (03/08/2023)   Social Connection and Isolation Panel [NHANES]    Frequency of Communication with Friends and Family: More than three times a week    Frequency of Social Gatherings with Friends and Family: More than three times a week  Attends Religious Services: Never    Active Member of Clubs or Organizations: No    Attends Banker Meetings: Never    Marital Status: Divorced  Catering manager Violence: Not At Risk (03/08/2023)   Humiliation, Afraid, Rape, and Kick questionnaire    Fear of Current or Ex-Partner: No    Emotionally Abused: No    Physically Abused: No    Sexually Abused: No   Social History   Tobacco Use  Smoking Status Never  Smokeless Tobacco Never   Social History   Substance and Sexual Activity  Alcohol Use Yes   Comment: occasional    Family History:  Family History  Problem Relation Age of Onset   Alcohol abuse Father    Mental illness Mother        Depression   Cancer Mother        started in back    Brain cancer Sister    Breast cancer Neg Hx     Past medical history, surgical history, medications, allergies, family history and social history reviewed with patient today and changes made to appropriate areas of the chart.   Review of Systems  Constitutional: Negative.   HENT: Negative.    Eyes: Negative.   Respiratory: Negative.    Cardiovascular:  Positive for palpitations. Negative for chest pain, orthopnea, claudication, leg swelling and PND.  Gastrointestinal: Negative.   Genitourinary: Negative.   Musculoskeletal:  Positive for joint pain. Negative for back pain, falls, myalgias and neck pain.  Skin: Negative.   Neurological:  Positive for dizziness. Negative for  tingling, tremors, sensory change, speech change, focal weakness, seizures, loss of consciousness, weakness and headaches.  Endo/Heme/Allergies:  Positive for environmental allergies. Negative for polydipsia. Does not bruise/bleed easily.  Psychiatric/Behavioral:  Positive for depression. Negative for hallucinations, memory loss, substance abuse and suicidal ideas. The patient is nervous/anxious. The patient does not have insomnia.    All other ROS negative except what is listed above and in the HPI.      Objective:    BP (!) 148/80   Pulse (!) 55   Ht 5\' 1"  (1.549 m)   Wt 201 lb (91.2 kg)   LMP  (LMP Unknown)   SpO2 98%   BMI 37.98 kg/m   Wt Readings from Last 3 Encounters:  06/27/23 201 lb (91.2 kg)  03/08/23 202 lb (91.6 kg)  12/24/22 202 lb 9.6 oz (91.9 kg)    Physical Exam Vitals and nursing note reviewed.  Constitutional:      General: She is not in acute distress.    Appearance: Normal appearance. She is obese. She is not ill-appearing, toxic-appearing or diaphoretic.  HENT:     Head: Normocephalic and atraumatic.     Right Ear: Tympanic membrane, ear canal and external ear normal. There is no impacted cerumen.     Left Ear: Tympanic membrane, ear canal and external ear normal. There is no impacted cerumen.     Nose: Nose normal. No congestion or rhinorrhea.     Mouth/Throat:     Mouth: Mucous membranes are moist.     Pharynx: Oropharynx is clear. No oropharyngeal exudate or posterior oropharyngeal erythema.  Eyes:     General: No scleral icterus.       Right eye: No discharge.        Left eye: No discharge.     Extraocular Movements: Extraocular movements intact.     Conjunctiva/sclera: Conjunctivae normal.     Pupils: Pupils are equal, round,  and reactive to light.  Neck:     Vascular: No carotid bruit.  Cardiovascular:     Rate and Rhythm: Normal rate and regular rhythm.     Pulses: Normal pulses.     Heart sounds: No murmur heard.    No friction rub. No  gallop.  Pulmonary:     Effort: Pulmonary effort is normal. No respiratory distress.     Breath sounds: Normal breath sounds. No stridor. No wheezing, rhonchi or rales.  Chest:     Chest wall: No tenderness.  Abdominal:     General: Abdomen is flat. Bowel sounds are normal. There is no distension.     Palpations: Abdomen is soft. There is no mass.     Tenderness: There is no abdominal tenderness. There is no right CVA tenderness, left CVA tenderness, guarding or rebound.     Hernia: No hernia is present.  Genitourinary:    Comments: Breast and pelvic exams deferred with shared decision making Musculoskeletal:        General: No swelling, tenderness, deformity or signs of injury.     Cervical back: Normal range of motion and neck supple. No rigidity. No muscular tenderness.     Right lower leg: No edema.     Left lower leg: No edema.  Lymphadenopathy:     Cervical: No cervical adenopathy.  Skin:    General: Skin is warm and dry.     Capillary Refill: Capillary refill takes less than 2 seconds.     Coloration: Skin is not jaundiced or pale.     Findings: No bruising, erythema, lesion or rash.  Neurological:     General: No focal deficit present.     Mental Status: She is alert and oriented to person, place, and time. Mental status is at baseline.     Cranial Nerves: No cranial nerve deficit.     Sensory: No sensory deficit.     Motor: No weakness.     Coordination: Coordination normal.     Gait: Gait normal.     Deep Tendon Reflexes: Reflexes normal.  Psychiatric:        Mood and Affect: Mood normal.        Behavior: Behavior normal.        Thought Content: Thought content normal.        Judgment: Judgment normal.     Results for orders placed or performed in visit on 12/24/22  CBC with Differential/Platelet  Result Value Ref Range   WBC 7.2 3.4 - 10.8 x10E3/uL   RBC 4.57 3.77 - 5.28 x10E6/uL   Hemoglobin 14.2 11.1 - 15.9 g/dL   Hematocrit 16.1 09.6 - 46.6 %   MCV 93 79  - 97 fL   MCH 31.1 26.6 - 33.0 pg   MCHC 33.3 31.5 - 35.7 g/dL   RDW 04.5 40.9 - 81.1 %   Platelets 211 150 - 450 x10E3/uL   Neutrophils 72 Not Estab. %   Lymphs 19 Not Estab. %   Monocytes 7 Not Estab. %   Eos 2 Not Estab. %   Basos 0 Not Estab. %   Neutrophils Absolute 5.1 1.4 - 7.0 x10E3/uL   Lymphocytes Absolute 1.4 0.7 - 3.1 x10E3/uL   Monocytes Absolute 0.5 0.1 - 0.9 x10E3/uL   EOS (ABSOLUTE) 0.2 0.0 - 0.4 x10E3/uL   Basophils Absolute 0.0 0.0 - 0.2 x10E3/uL   Immature Granulocytes 0 Not Estab. %   Immature Grans (Abs) 0.0 0.0 - 0.1 x10E3/uL  Lipid  Panel w/o Chol/HDL Ratio  Result Value Ref Range   Cholesterol, Total 157 100 - 199 mg/dL   Triglycerides 098 0 - 149 mg/dL   HDL 62 >11 mg/dL   VLDL Cholesterol Cal 19 5 - 40 mg/dL   LDL Chol Calc (NIH) 76 0 - 99 mg/dL  VITAMIN D 25 Hydroxy (Vit-D Deficiency, Fractures)  Result Value Ref Range   Vit D, 25-Hydroxy 40.1 30.0 - 100.0 ng/mL  Comprehensive metabolic panel  Result Value Ref Range   Glucose 94 70 - 99 mg/dL   BUN 18 8 - 27 mg/dL   Creatinine, Ser 9.14 0.57 - 1.00 mg/dL   eGFR 92 >78 GN/FAO/1.30   BUN/Creatinine Ratio 26 12 - 28   Sodium 141 134 - 144 mmol/L   Potassium 4.4 3.5 - 5.2 mmol/L   Chloride 101 96 - 106 mmol/L   CO2 24 20 - 29 mmol/L   Calcium 11.0 (H) 8.7 - 10.3 mg/dL   Total Protein 7.2 6.0 - 8.5 g/dL   Albumin 4.5 3.8 - 4.8 g/dL   Globulin, Total 2.7 1.5 - 4.5 g/dL   Albumin/Globulin Ratio 1.7 1.2 - 2.2   Bilirubin Total 0.6 0.0 - 1.2 mg/dL   Alkaline Phosphatase 106 44 - 121 IU/L   AST 27 0 - 40 IU/L   ALT 26 0 - 32 IU/L      Assessment & Plan:   Problem List Items Addressed This Visit       Cardiovascular and Mediastinum   Senile purpura (HCC)    Stable. Continue to monitor.       Relevant Medications   amLODipine (NORVASC) 5 MG tablet   atorvastatin (LIPITOR) 20 MG tablet   metoprolol succinate (TOPROL-XL) 100 MG 24 hr tablet   valsartan-hydrochlorothiazide (DIOVAN-HCT)  160-12.5 MG tablet   Other Relevant Orders   CBC with Differential/Platelet   Comprehensive metabolic panel     Genitourinary   Benign hypertensive renal disease    Running a little high, possibly due to stress and pain. Will monitor and recheck next visit.       Relevant Medications   amLODipine (NORVASC) 5 MG tablet   metoprolol succinate (TOPROL-XL) 100 MG 24 hr tablet   Other Relevant Orders   CBC with Differential/Platelet   Comprehensive metabolic panel   TSH     Other   Major depression in remission (HCC)    Acting up with her sister's illness- will start hydroxyzine and monitor. May need to restart zoloft.       Relevant Medications   hydrOXYzine (VISTARIL) 25 MG capsule   Other Relevant Orders   CBC with Differential/Platelet   Comprehensive metabolic panel   Hypercholesteremia    Under good control on current regimen. Continue current regimen. Continue to monitor. Call with any concerns. Refills given. Labs drawn today.        Relevant Medications   amLODipine (NORVASC) 5 MG tablet   atorvastatin (LIPITOR) 20 MG tablet   metoprolol succinate (TOPROL-XL) 100 MG 24 hr tablet   valsartan-hydrochlorothiazide (DIOVAN-HCT) 160-12.5 MG tablet   Other Relevant Orders   CBC with Differential/Platelet   Comprehensive metabolic panel   Lipid Panel w/o Chol/HDL Ratio   Morbid obesity (HCC)    Encouraged diet and exercise with goal of losing 1-2lbs per week. Call with any concerns.       Relevant Orders   CBC with Differential/Platelet   Comprehensive metabolic panel   Avitaminosis D    Rechecking labs today. Await  results. Treat as needed.       Relevant Orders   CBC with Differential/Platelet   Comprehensive metabolic panel   VITAMIN D 25 Hydroxy (Vit-D Deficiency, Fractures)   Other Visit Diagnoses     Routine general medical examination at a health care facility    -  Primary   Vaccines up to date. Screening labs checked today. Mammo, colonoscopy and DEXA  up to date. Continue diet and exercise. Call with any concerns.   Chronic pain of left knee       Will obtain x-ray and recheck as needed. Call with any concerns.   Relevant Orders   DG Knee Complete 4 Views Left        Follow up plan: Return for Pending x-ray results. Marland Kitchen   LABORATORY TESTING:  - Pap smear: up to date  IMMUNIZATIONS:   - Tdap: Tetanus vaccination status reviewed: last tetanus booster within 10 years. - Influenza: Up to date - Pneumovax: Up to date - Prevnar: Up to date - COVID: Up to date - HPV: Not applicable - Shingrix vaccine: Up to date  SCREENING: -Mammogram: Up to date  - Colonoscopy: Up to date  - Bone Density: Up to date   PATIENT COUNSELING:   Advised to take 1 mg of folate supplement per day if capable of pregnancy.   Sexuality: Discussed sexually transmitted diseases, partner selection, use of condoms, avoidance of unintended pregnancy  and contraceptive alternatives.   Advised to avoid cigarette smoking.  I discussed with the patient that most people either abstain from alcohol or drink within safe limits (<=14/week and <=4 drinks/occasion for males, <=7/weeks and <= 3 drinks/occasion for females) and that the risk for alcohol disorders and other health effects rises proportionally with the number of drinks per week and how often a drinker exceeds daily limits.  Discussed cessation/primary prevention of drug use and availability of treatment for abuse.   Diet: Encouraged to adjust caloric intake to maintain  or achieve ideal body weight, to reduce intake of dietary saturated fat and total fat, to limit sodium intake by avoiding high sodium foods and not adding table salt, and to maintain adequate dietary potassium and calcium preferably from fresh fruits, vegetables, and low-fat dairy products.    stressed the importance of regular exercise  Injury prevention: Discussed safety belts, safety helmets, smoke detector, smoking near bedding or  upholstery.   Dental health: Discussed importance of regular tooth brushing, flossing, and dental visits.    NEXT PREVENTATIVE PHYSICAL DUE IN 1 YEAR. Return for Pending x-ray results. Marland Kitchen

## 2023-06-27 NOTE — Assessment & Plan Note (Signed)
Rechecking labs today. Await results. Treat as needed.  °

## 2023-06-28 ENCOUNTER — Encounter: Payer: Self-pay | Admitting: Family Medicine

## 2023-06-28 LAB — CBC WITH DIFFERENTIAL/PLATELET
Basophils Absolute: 0 10*3/uL (ref 0.0–0.2)
Basos: 0 %
EOS (ABSOLUTE): 0.2 10*3/uL (ref 0.0–0.4)
Eos: 3 %
Hematocrit: 43 % (ref 34.0–46.6)
Hemoglobin: 14 g/dL (ref 11.1–15.9)
Immature Grans (Abs): 0 10*3/uL (ref 0.0–0.1)
Immature Granulocytes: 0 %
Lymphocytes Absolute: 1.2 10*3/uL (ref 0.7–3.1)
Lymphs: 18 %
MCH: 31.2 pg (ref 26.6–33.0)
MCHC: 32.6 g/dL (ref 31.5–35.7)
MCV: 96 fL (ref 79–97)
Monocytes Absolute: 0.5 10*3/uL (ref 0.1–0.9)
Monocytes: 7 %
Neutrophils Absolute: 4.8 10*3/uL (ref 1.4–7.0)
Neutrophils: 72 %
Platelets: 201 10*3/uL (ref 150–450)
RBC: 4.49 x10E6/uL (ref 3.77–5.28)
RDW: 12.8 % (ref 11.7–15.4)
WBC: 6.7 10*3/uL (ref 3.4–10.8)

## 2023-06-28 LAB — LIPID PANEL W/O CHOL/HDL RATIO
Cholesterol, Total: 148 mg/dL (ref 100–199)
HDL: 57 mg/dL (ref >39)
LDL Chol Calc (NIH): 65 mg/dL (ref 0–99)
Triglycerides: 151 mg/dL — ABNORMAL HIGH (ref 0–149)
VLDL Cholesterol Cal: 26 mg/dL (ref 5–40)

## 2023-06-28 LAB — COMPREHENSIVE METABOLIC PANEL
ALT: 23 [IU]/L (ref 0–32)
AST: 24 [IU]/L (ref 0–40)
Albumin: 4.6 g/dL (ref 3.8–4.8)
Alkaline Phosphatase: 106 [IU]/L (ref 44–121)
BUN/Creatinine Ratio: 24 (ref 12–28)
BUN: 18 mg/dL (ref 8–27)
Bilirubin Total: 0.5 mg/dL (ref 0.0–1.2)
CO2: 24 mmol/L (ref 20–29)
Calcium: 11.3 mg/dL — ABNORMAL HIGH (ref 8.7–10.3)
Chloride: 103 mmol/L (ref 96–106)
Creatinine, Ser: 0.76 mg/dL (ref 0.57–1.00)
Globulin, Total: 2.7 g/dL (ref 1.5–4.5)
Glucose: 102 mg/dL — ABNORMAL HIGH (ref 70–99)
Potassium: 4.5 mmol/L (ref 3.5–5.2)
Sodium: 141 mmol/L (ref 134–144)
Total Protein: 7.3 g/dL (ref 6.0–8.5)
eGFR: 83 mL/min/{1.73_m2} (ref >59)

## 2023-06-28 LAB — TSH: TSH: 0.596 u[IU]/mL (ref 0.450–4.500)

## 2023-06-28 LAB — VITAMIN D 25 HYDROXY (VIT D DEFICIENCY, FRACTURES): Vit D, 25-Hydroxy: 37.3 ng/mL (ref 30.0–100.0)

## 2023-07-01 ENCOUNTER — Telehealth: Payer: Self-pay | Admitting: Family Medicine

## 2023-07-01 NOTE — Telephone Encounter (Signed)
Please let her know that her x-ray showed severe arthritis in her knee and we can get her a shot as soon as she likes.

## 2023-07-04 NOTE — Telephone Encounter (Signed)
Called and scheduled patient for 10/30 with Dr.Johnson for her knee injection

## 2023-07-13 ENCOUNTER — Encounter: Payer: Self-pay | Admitting: Family Medicine

## 2023-07-13 ENCOUNTER — Ambulatory Visit: Payer: Medicare Other | Admitting: Family Medicine

## 2023-07-13 VITALS — BP 142/78 | HR 52 | Ht 61.0 in | Wt 202.4 lb

## 2023-07-13 DIAGNOSIS — R2689 Other abnormalities of gait and mobility: Secondary | ICD-10-CM | POA: Diagnosis not present

## 2023-07-13 DIAGNOSIS — M1712 Unilateral primary osteoarthritis, left knee: Secondary | ICD-10-CM

## 2023-07-13 NOTE — Progress Notes (Signed)
BP (!) 142/78   Pulse (!) 52   Ht 5\' 1"  (1.549 m)   Wt 202 lb 6.4 oz (91.8 kg)   LMP  (LMP Unknown)   SpO2 99%   BMI 38.24 kg/m    Subjective:    Patient ID: Anne Bell, female    DOB: 09/11/1951, 72 y.o.   MRN: [id number]  HPI: Anne Bell is a 72 y.o. female  Chief Complaint  Patient presents with   Knee Pain    Patient says after last visit, she went over to have imaging done of her L knee. Patient says she is still having issues with her knee first thing in the morning. Patient says in the mornings, she will not stiffness is the best way to explain it. Patient says the pain and discomfort happen across the knee cap.    KNEE PAIN Duration: chronic Involved knee: left Mechanism of injury:  Fall in the Spring Location:diffuse Onset: sudden Severity: severe  Quality:  aching and sore Frequency: constant Radiation: yes- into her shin Aggravating factors: walking and weight bearing  Alleviating factors: rest  Status: worse Treatments attempted: rest, ice, heat, APAP, ibuprofen, aleve, and HEP  Relief with NSAIDs?:  mild Weakness with weight bearing or walking: yes Sensation of giving way: yes Locking: no Popping: no Bruising: no Swelling: yes Redness: no Paresthesias/decreased sensation: no Fevers: no  Balance has been doing poorly- worried she is going to fall repeatedly. Has only fell 1x, but not feeling well.   Relevant past medical, surgical, family and social history reviewed and updated as indicated. Interim medical history since our last visit reviewed. Allergies and medications reviewed and updated.  Review of Systems  Constitutional: Negative.   Respiratory: Negative.    Cardiovascular: Negative.   Gastrointestinal: Negative.   Musculoskeletal:  Positive for arthralgias and gait problem. Negative for back pain, joint swelling, myalgias, neck pain and neck stiffness.  Skin: Negative.   Psychiatric/Behavioral: Negative.      Per HPI unless  specifically indicated above     Objective:    BP (!) 142/78   Pulse (!) 52   Ht 5\' 1"  (1.549 m)   Wt 202 lb 6.4 oz (91.8 kg)   LMP  (LMP Unknown)   SpO2 99%   BMI 38.24 kg/m   Wt Readings from Last 3 Encounters:  07/13/23 202 lb 6.4 oz (91.8 kg)  06/27/23 201 lb (91.2 kg)  03/08/23 202 lb (91.6 kg)    Physical Exam Vitals and nursing note reviewed.  Constitutional:      General: She is not in acute distress.    Appearance: Normal appearance. She is not ill-appearing, toxic-appearing or diaphoretic.  HENT:     Head: Normocephalic and atraumatic.     Right Ear: External ear normal.     Left Ear: External ear normal.     Nose: Nose normal.     Mouth/Throat:     Mouth: Mucous membranes are moist.     Pharynx: Oropharynx is clear.  Eyes:     General: No scleral icterus.       Right eye: No discharge.        Left eye: No discharge.     Extraocular Movements: Extraocular movements intact.     Conjunctiva/sclera: Conjunctivae normal.     Pupils: Pupils are equal, round, and reactive to light.  Cardiovascular:     Rate and Rhythm: Normal rate and regular rhythm.     Pulses: Normal pulses.  Heart sounds: Normal heart sounds. No murmur heard.    No friction rub. No gallop.  Pulmonary:     Effort: Pulmonary effort is normal. No respiratory distress.     Breath sounds: Normal breath sounds. No stridor. No wheezing, rhonchi or rales.  Chest:     Chest wall: No tenderness.  Musculoskeletal:        General: Swelling (L knee) and tenderness (L knee) present.     Cervical back: Normal range of motion and neck supple.  Skin:    General: Skin is warm and dry.     Capillary Refill: Capillary refill takes less than 2 seconds.     Coloration: Skin is not jaundiced or pale.     Findings: No bruising, erythema, lesion or rash.  Neurological:     General: No focal deficit present.     Mental Status: She is alert and oriented to person, place, and time. Mental status is at  baseline.  Psychiatric:        Mood and Affect: Mood normal.        Behavior: Behavior normal.        Thought Content: Thought content normal.        Judgment: Judgment normal.     TECHNIQUE: AP, lateral, medial oblique and sunrise views of the left knee.  FINDINGS: There is moderate to severe osteoarthrosis at the left knee with medial and lateral narrowing, osteophytosis, sclerosis, marked patellofemoral narrowing and osteophytosis and multiple calcifications suggesting loose bodies, both within the suprapatellar bursa and posteriorly. The AP image is obtained with the patient nonweightbearing, and the extent of joint narrowing likely is underestimated. There is no evidence for recent fracture or dislocation. Lateral subluxation of the femorotibial joint is likely result of the osteoarthrosis. Procedure Note  Renner, [place], MD - 06/27/2023 Formatting of this note might be different from the original. EXAM: XR KNEE 4 OR MORE VIEWS LEFT DATE: 06/27/2023 10:50 AM ACCESSION: [id number] UN DICTATED: 06/27/2023 11:31 AM INTERPRETATION LOCATION: [facility]  CLINICAL INDICATION: 72 years old Female with l knee pain  - M25.562 - Left knee pain, unspecified chronicity    COMPARISON: None.  TECHNIQUE: AP, lateral, medial oblique and sunrise views of the left knee.  FINDINGS: There is moderate to severe osteoarthrosis at the left knee with medial and lateral narrowing, osteophytosis, sclerosis, marked patellofemoral narrowing and osteophytosis and multiple calcifications suggesting loose bodies, both within the suprapatellar bursa and posteriorly. The AP image is obtained with the patient nonweightbearing, and the extent of joint narrowing likely is underestimated. There is no evidence for recent fracture or dislocation. Lateral subluxation of the femorotibial joint is likely result of the osteoarthrosis.  IMPRESSION: Severe osteoarthrosis at the left knee with multiple  intra-articular loose bodies.  Results for orders placed or performed in visit on 06/27/23  CBC with Differential/Platelet  Result Value Ref Range   WBC 6.7 3.4 - 10.8 x10E3/uL   RBC 4.49 3.77 - 5.28 x10E6/uL   Hemoglobin 14.0 11.1 - 15.9 g/dL   Hematocrit 87.5 64.3 - 46.6 %   MCV 96 79 - 97 fL   MCH 31.2 26.6 - 33.0 pg   MCHC 32.6 31.5 - 35.7 g/dL   RDW 32.9 51.8 - 84.1 %   Platelets 201 150 - 450 x10E3/uL   Neutrophils 72 Not Estab. %   Lymphs 18 Not Estab. %   Monocytes 7 Not Estab. %   Eos 3 Not Estab. %   Basos 0 Not Estab. %  Neutrophils Absolute 4.8 1.4 - 7.0 x10E3/uL   Lymphocytes Absolute 1.2 0.7 - 3.1 x10E3/uL   Monocytes Absolute 0.5 0.1 - 0.9 x10E3/uL   EOS (ABSOLUTE) 0.2 0.0 - 0.4 x10E3/uL   Basophils Absolute 0.0 0.0 - 0.2 x10E3/uL   Immature Granulocytes 0 Not Estab. %   Immature Grans (Abs) 0.0 0.0 - 0.1 x10E3/uL  Comprehensive metabolic panel  Result Value Ref Range   Glucose 102 (H) 70 - 99 mg/dL   BUN 18 8 - 27 mg/dL   Creatinine, Ser 0.86 0.57 - 1.00 mg/dL   eGFR 83 >57 QI/ONG/2.95   BUN/Creatinine Ratio 24 12 - 28   Sodium 141 134 - 144 mmol/L   Potassium 4.5 3.5 - 5.2 mmol/L   Chloride 103 96 - 106 mmol/L   CO2 24 20 - 29 mmol/L   Calcium 11.3 (H) 8.7 - 10.3 mg/dL   Total Protein 7.3 6.0 - 8.5 g/dL   Albumin 4.6 3.8 - 4.8 g/dL   Globulin, Total 2.7 1.5 - 4.5 g/dL   Bilirubin Total 0.5 0.0 - 1.2 mg/dL   Alkaline Phosphatase 106 44 - 121 IU/L   AST 24 0 - 40 IU/L   ALT 23 0 - 32 IU/L  Lipid Panel w/o Chol/HDL Ratio  Result Value Ref Range   Cholesterol, Total 148 100 - 199 mg/dL   Triglycerides 284 (H) 0 - 149 mg/dL   HDL 57 >13 mg/dL   VLDL Cholesterol Cal 26 5 - 40 mg/dL   LDL Chol Calc (NIH) 65 0 - 99 mg/dL  TSH  Result Value Ref Range   TSH 0.596 0.450 - 4.500 uIU/mL  VITAMIN D 25 Hydroxy (Vit-D Deficiency, Fractures)  Result Value Ref Range   Vit D, 25-Hydroxy 37.3 30.0 - 100.0 ng/mL      Assessment & Plan:   Problem List Items  Addressed This Visit       Musculoskeletal and Integument   Localized osteoarthritis of left knee - Primary    Injection given today as below. Given severity of her arthritis will get her into ortho- referral placed today.       Relevant Orders   Ambulatory referral to Orthopedic Surgery   Other Visit Diagnoses     Balance problem       Will get her set up with PT to help with balance retraining. Await their input.   Relevant Orders   Ambulatory referral to Physical Therapy       Procedure: Left  Knee Intraarticular Steroid Injection        Diagnosis:   ICD-10-CM   1. Localized osteoarthritis of left knee  M17.12 Ambulatory referral to Orthopedic Surgery    2. Balance problem  R26.89 Ambulatory referral to Physical Therapy   Will get her set up with PT to help with balance retraining. Await their input.      Physician: MJ Consent:  Risks, benefits, and alternative treatments discussed and all questions were answered.  Patient elected to proceed and verbal consent obtained.  Description: Area prepped and draped using  semi-sterile technique.  Using a anterior/lateral approach, a mixture of 4 cc of  1% lidocaine & 1 cc of Kenalog 40 was injected into knee joint.  A bandage was then placed over the injection site. Complications: none Post Procedure Instructions: Wound care instructions discussed and patient was instructed to keep area clean and dry.  Signs and symptoms of infection discussed, patient agrees to contact the office ASAP should they occur.  Follow  Up: Return in about 2 months (around 09/19/2023).  Follow up plan: Return in about 2 months (around 09/19/2023).

## 2023-07-13 NOTE — Assessment & Plan Note (Signed)
Injection given today as below. Given severity of her arthritis will get her into ortho- referral placed today.

## 2023-07-20 DIAGNOSIS — M170 Bilateral primary osteoarthritis of knee: Secondary | ICD-10-CM | POA: Diagnosis not present

## 2023-08-17 DIAGNOSIS — M170 Bilateral primary osteoarthritis of knee: Secondary | ICD-10-CM | POA: Diagnosis not present

## 2023-08-18 ENCOUNTER — Encounter: Payer: Self-pay | Admitting: Family Medicine

## 2023-09-09 DIAGNOSIS — H43811 Vitreous degeneration, right eye: Secondary | ICD-10-CM | POA: Diagnosis not present

## 2023-09-09 DIAGNOSIS — H2513 Age-related nuclear cataract, bilateral: Secondary | ICD-10-CM | POA: Diagnosis not present

## 2023-09-09 DIAGNOSIS — H35362 Drusen (degenerative) of macula, left eye: Secondary | ICD-10-CM | POA: Diagnosis not present

## 2023-09-09 DIAGNOSIS — H43812 Vitreous degeneration, left eye: Secondary | ICD-10-CM | POA: Diagnosis not present

## 2023-09-12 ENCOUNTER — Ambulatory Visit (INDEPENDENT_AMBULATORY_CARE_PROVIDER_SITE_OTHER): Payer: Medicare Other | Admitting: Family Medicine

## 2023-09-12 ENCOUNTER — Encounter: Payer: Self-pay | Admitting: Family Medicine

## 2023-09-12 VITALS — BP 138/75 | HR 65 | Wt 201.8 lb

## 2023-09-12 DIAGNOSIS — M1712 Unilateral primary osteoarthritis, left knee: Secondary | ICD-10-CM | POA: Diagnosis not present

## 2023-09-12 NOTE — Progress Notes (Signed)
BP 138/75   Pulse 65   Wt 201 lb 12.8 oz (91.5 kg)   LMP  (LMP Unknown)   SpO2 96%   BMI 38.13 kg/m    Subjective:    Patient ID: Anne Bell, female    DOB: 13-Nov-1950, 72 y.o.   MRN: [id number]  HPI: Anne Bell is a 72 y.o. female  Chief Complaint  Patient presents with   Osteoarthritis    Patient says it is going easier than she thought it was going to be. Patient says her pain and discomfort isn't constant, just comes and goes now. Patient says she is taking Meloxicam once a day and occasionally take a Tylenol to help.    KNEE PAIN- ortho is thinking that she needs a knee replacement.  Duration: chronic Involved knee: bilateral L>R Mechanism of injury: unknown Location:diffuse Onset: gradual Severity: moderate  Quality:  aching and sore Frequency: constant Radiation: no Aggravating factors: weight bearing, walking, running, and stairs  Alleviating factors: brace, tylenol, meloxicam  Status: better Treatments attempted: rest, ice, heat, APAP, ibuprofen, aleve, and HEP  Relief with NSAIDs?:  significant Weakness with weight bearing or walking: no Sensation of giving way: no Locking: no Popping: no Bruising: no Swelling: no Redness: no Paresthesias/decreased sensation: no Fevers: no   Relevant past medical, surgical, family and social history reviewed and updated as indicated. Interim medical history since our last visit reviewed. Allergies and medications reviewed and updated.  Review of Systems  Constitutional: Negative.   Respiratory: Negative.    Cardiovascular: Negative.   Musculoskeletal:  Positive for arthralgias. Negative for back pain, gait problem, joint swelling, myalgias, neck pain and neck stiffness.  Skin: Negative.   Psychiatric/Behavioral: Negative.      Per HPI unless specifically indicated above     Objective:    BP 138/75   Pulse 65   Wt 201 lb 12.8 oz (91.5 kg)   LMP  (LMP Unknown)   SpO2 96%   BMI 38.13 kg/m   Wt  Readings from Last 3 Encounters:  09/12/23 201 lb 12.8 oz (91.5 kg)  07/13/23 202 lb 6.4 oz (91.8 kg)  06/27/23 201 lb (91.2 kg)    Physical Exam Vitals and nursing note reviewed.  Constitutional:      General: She is not in acute distress.    Appearance: Normal appearance. She is not ill-appearing, toxic-appearing or diaphoretic.  HENT:     Head: Normocephalic and atraumatic.     Right Ear: External ear normal.     Left Ear: External ear normal.     Nose: Nose normal.     Mouth/Throat:     Mouth: Mucous membranes are moist.     Pharynx: Oropharynx is clear.  Eyes:     General: No scleral icterus.       Right eye: No discharge.        Left eye: No discharge.     Extraocular Movements: Extraocular movements intact.     Conjunctiva/sclera: Conjunctivae normal.     Pupils: Pupils are equal, round, and reactive to light.  Cardiovascular:     Rate and Rhythm: Normal rate and regular rhythm.     Pulses: Normal pulses.     Heart sounds: Normal heart sounds. No murmur heard.    No friction rub. No gallop.  Pulmonary:     Effort: Pulmonary effort is normal. No respiratory distress.     Breath sounds: Normal breath sounds. No stridor. No wheezing, rhonchi or rales.  Chest:     Chest wall: No tenderness.  Musculoskeletal:        General: Normal range of motion.     Cervical back: Normal range of motion and neck supple.  Skin:    General: Skin is warm and dry.     Capillary Refill: Capillary refill takes less than 2 seconds.     Coloration: Skin is not jaundiced or pale.     Findings: No bruising, erythema, lesion or rash.  Neurological:     General: No focal deficit present.     Mental Status: She is alert and oriented to person, place, and time. Mental status is at baseline.  Psychiatric:        Mood and Affect: Mood normal.        Behavior: Behavior normal.        Thought Content: Thought content normal.        Judgment: Judgment normal.     Results for orders placed or  performed in visit on 06/27/23  CBC with Differential/Platelet   Collection Time: 06/27/23  9:23 AM  Result Value Ref Range   WBC 6.7 3.4 - 10.8 x10E3/uL   RBC 4.49 3.77 - 5.28 x10E6/uL   Hemoglobin 14.0 11.1 - 15.9 g/dL   Hematocrit 16.1 09.6 - 46.6 %   MCV 96 79 - 97 fL   MCH 31.2 26.6 - 33.0 pg   MCHC 32.6 31.5 - 35.7 g/dL   RDW 04.5 40.9 - 81.1 %   Platelets 201 150 - 450 x10E3/uL   Neutrophils 72 Not Estab. %   Lymphs 18 Not Estab. %   Monocytes 7 Not Estab. %   Eos 3 Not Estab. %   Basos 0 Not Estab. %   Neutrophils Absolute 4.8 1.4 - 7.0 x10E3/uL   Lymphocytes Absolute 1.2 0.7 - 3.1 x10E3/uL   Monocytes Absolute 0.5 0.1 - 0.9 x10E3/uL   EOS (ABSOLUTE) 0.2 0.0 - 0.4 x10E3/uL   Basophils Absolute 0.0 0.0 - 0.2 x10E3/uL   Immature Granulocytes 0 Not Estab. %   Immature Grans (Abs) 0.0 0.0 - 0.1 x10E3/uL  Comprehensive metabolic panel   Collection Time: 06/27/23  9:23 AM  Result Value Ref Range   Glucose 102 (H) 70 - 99 mg/dL   BUN 18 8 - 27 mg/dL   Creatinine, Ser 9.14 0.57 - 1.00 mg/dL   eGFR 83 >78 GN/FAO/1.30   BUN/Creatinine Ratio 24 12 - 28   Sodium 141 134 - 144 mmol/L   Potassium 4.5 3.5 - 5.2 mmol/L   Chloride 103 96 - 106 mmol/L   CO2 24 20 - 29 mmol/L   Calcium 11.3 (H) 8.7 - 10.3 mg/dL   Total Protein 7.3 6.0 - 8.5 g/dL   Albumin 4.6 3.8 - 4.8 g/dL   Globulin, Total 2.7 1.5 - 4.5 g/dL   Bilirubin Total 0.5 0.0 - 1.2 mg/dL   Alkaline Phosphatase 106 44 - 121 IU/L   AST 24 0 - 40 IU/L   ALT 23 0 - 32 IU/L  Lipid Panel w/o Chol/HDL Ratio   Collection Time: 06/27/23  9:23 AM  Result Value Ref Range   Cholesterol, Total 148 100 - 199 mg/dL   Triglycerides 865 (H) 0 - 149 mg/dL   HDL 57 >78 mg/dL   VLDL Cholesterol Cal 26 5 - 40 mg/dL   LDL Chol Calc (NIH) 65 0 - 99 mg/dL  TSH   Collection Time: 06/27/23  9:23 AM  Result Value Ref Range  TSH 0.596 0.450 - 4.500 uIU/mL  VITAMIN D 25 Hydroxy (Vit-D Deficiency, Fractures)   Collection Time: 06/27/23   9:23 AM  Result Value Ref Range   Vit D, 25-Hydroxy 37.3 30.0 - 100.0 ng/mL      Assessment & Plan:   Problem List Items Addressed This Visit       Musculoskeletal and Integument   Localized osteoarthritis of left knee - Primary   Stable. Continue to follow with ortho. Continue meloxicam. Call with any concerns.       Relevant Medications   meloxicam (MOBIC) 15 MG tablet     Follow up plan: Return in about 15 weeks (around 12/26/2023).

## 2023-09-12 NOTE — Assessment & Plan Note (Signed)
Stable. Continue to follow with ortho. Continue meloxicam. Call with any concerns.

## 2023-10-19 DIAGNOSIS — M1712 Unilateral primary osteoarthritis, left knee: Secondary | ICD-10-CM | POA: Diagnosis not present

## 2023-10-19 DIAGNOSIS — M170 Bilateral primary osteoarthritis of knee: Secondary | ICD-10-CM | POA: Diagnosis not present

## 2023-10-21 ENCOUNTER — Ambulatory Visit: Payer: Self-pay

## 2023-10-21 NOTE — Telephone Encounter (Signed)
 Message from [name] sent at 10/21/2023  2:05 PM EST  Summary: Anne Bell urine   Patient states that her urine has been grey for 3 days.        Called pt and LM on VM to CB.

## 2023-10-21 NOTE — Telephone Encounter (Signed)
 Message from Edsel HERO sent at 10/21/2023  2:05 PM EST  Summary: [place] urine   Patient states that her urine has been grey for 3 days.        Third attempt to contact pt and unsuccessful. Routing to office.  Reason for Disposition . Third attempt to contact caller AND no contact made. Phone number verified.  Protocols used: No Contact or Duplicate Contact Call-A-AH

## 2023-10-24 NOTE — Telephone Encounter (Signed)
 Appt scheduled 10/27/2023 at 3:40 PM

## 2023-10-27 ENCOUNTER — Ambulatory Visit: Payer: Medicare Other | Admitting: Pediatrics

## 2023-10-27 VITALS — BP 150/76 | HR 60 | Temp 97.4°F | Wt 197.4 lb

## 2023-10-27 DIAGNOSIS — Z1330 Encounter for screening examination for mental health and behavioral disorders, unspecified: Secondary | ICD-10-CM | POA: Diagnosis not present

## 2023-10-27 DIAGNOSIS — I129 Hypertensive chronic kidney disease with stage 1 through stage 4 chronic kidney disease, or unspecified chronic kidney disease: Secondary | ICD-10-CM | POA: Diagnosis not present

## 2023-10-27 DIAGNOSIS — R399 Unspecified symptoms and signs involving the genitourinary system: Secondary | ICD-10-CM

## 2023-10-27 DIAGNOSIS — N949 Unspecified condition associated with female genital organs and menstrual cycle: Secondary | ICD-10-CM

## 2023-10-27 NOTE — Patient Instructions (Signed)
Will call you with results

## 2023-10-27 NOTE — Progress Notes (Unsigned)
Office Visit  BP (!) 150/76 (BP Location: Right Arm, Cuff Size: Normal)   Pulse 60   Temp (!) 97.4 F (36.3 C) (Oral)   Wt 197 lb 6.4 oz (89.5 kg)   LMP  (LMP Unknown)   SpO2 97%   BMI 37.30 kg/m    Subjective:    Patient ID: Anne Bell, female    DOB: 09/14/1950, 73 y.o.   MRN: [id number]  HPI: Anne Bell is a 73 y.o. female  Chief Complaint  Patient presents with   Urinary Tract Infection    Slight pain, pain located lower abdominal pain, burning/ uncomfortable frequently sensation started last weekend    Discussed the use of AI scribe software for clinical note transcription with the patient, who gave verbal consent to proceed.  History of Present Illness   Anne Bell is a 73 year old female who presents with urinary symptoms and lower abdominal pain.  She has been experiencing urinary symptoms for over a week, including frequent urination every fifteen minutes despite taking oxybutynin for bladder issues. She describes a sensation of pressure when sitting and attempting to urinate, accompanied by a non-intense pain. Her urine is very cloudy. She still wakes up four to five times at night to urinate.  She confirms the presence of lower abdominal pain, particularly when trying to initiate urination, but does not describe it as a spasm. No back pain, nausea, vomiting, or blood in the urine.      Relevant past medical, surgical, family and social history reviewed and updated as indicated. Interim medical history since our last visit reviewed. Allergies and medications reviewed and updated.  ROS per HPI unless specifically indicated above     Objective:    BP (!) 150/76 (BP Location: Right Arm, Cuff Size: Normal)   Pulse 60   Temp (!) 97.4 F (36.3 C) (Oral)   Wt 197 lb 6.4 oz (89.5 kg)   LMP  (LMP Unknown)   SpO2 97%   BMI 37.30 kg/m   Wt Readings from Last 3 Encounters:  10/27/23 197 lb 6.4 oz (89.5 kg)  09/12/23 201 lb 12.8 oz (91.5 kg)  07/13/23  202 lb 6.4 oz (91.8 kg)     Physical Exam Constitutional:      Appearance: Normal appearance.  Pulmonary:     Effort: Pulmonary effort is normal.  Abdominal:     General: Abdomen is flat. There is no distension.     Palpations: Abdomen is soft.     Tenderness: There is no abdominal tenderness. There is no right CVA tenderness or left CVA tenderness.  Musculoskeletal:        General: Normal range of motion.  Skin:    Comments: Normal skin color  Neurological:     General: No focal deficit present.     Mental Status: She is alert. Mental status is at baseline.  Psychiatric:        Mood and Affect: Mood normal.        Behavior: Behavior normal.        Thought Content: Thought content normal.         10/27/2023    4:08 PM 09/12/2023    1:47 PM 06/27/2023    8:49 AM 03/08/2023    9:10 AM 03/08/2023    9:07 AM  Depression screen PHQ 2/9  Decreased Interest 0 0 0 0 0  Down, Depressed, Hopeless 0 0 0 0 0  PHQ - 2 Score 0 0 0  0 0  Altered sleeping 0 0 0 0 0  Tired, decreased energy 0 0 0 0 0  Change in appetite 0 0 0 0 0  Feeling bad or failure about yourself  0 0 0 0 0  Trouble concentrating 0 0 0 0 0  Moving slowly or fidgety/restless 0 0 0 0 0  Suicidal thoughts 0 0 0 0 0  PHQ-9 Score 0 0 0 0 0  Difficult doing work/chores  Not difficult at all Not difficult at all Not difficult at all Not difficult at all       10/27/2023    4:08 PM 06/27/2023    8:49 AM 12/24/2022    9:54 AM 06/24/2022    9:10 AM  GAD 7 : Generalized Anxiety Score  Nervous, Anxious, on Edge 0 0 0 0  Control/stop worrying 0 0 0 0  Worry too much - different things 0 0 0 0  Trouble relaxing 0 0 0 0  Restless 0 0 0 0  Easily annoyed or irritable 0 0 0 0  Afraid - awful might happen 0 0 0 0  Total GAD 7 Score 0 0 0 0  Anxiety Difficulty  Not difficult at all Not difficult at all Not difficult at all       Assessment & Plan:  Assessment & Plan   Urinary tract infection symptoms Frequent  urination and lower abdominal pain for over a week. Cloudy urine. No back pain, nausea, vomiting, or hematuria. On Oxybutynin for bladder issues. Patient reports frequent nocturia despite Oxybutynin use. Consider dose adjustments or referral to urogyn after resolution of this acute issue given ongoing voiding concerns. -Collect urine sample for urinalysis and culture to rule out UTI. -Consider swab for yeast infection and BV. -Discussed potential increase in Oxybutynin dose or referral to urogynecologist for urodynamic testing after resolution of current symptoms. -     Urinalysis, Routine w reflex microscopic -     Urine Culture -     Microscopic Examination  Vaginal discomfort Will r/o below given low abdominal pain reported. Exam reassuring. -     WET PREP FOR TRICH, YEAST, CLUE -     WET PREP FOR TRICH, YEAST, CLUE  Hypertension Elevated blood pressure noted during visit suspect due to above. No changes for now will check at home and follow up w PCP. -Monitor blood pressure and follow up in April or earlier if patient prefers.  Encounter for behavioral health screening As part of their intake evaluation, the patient was screened for depression, anxiety.  PHQ9 SCORE 0, GAD7 SCORE 0. Screening results negative for tested conditions. CTM.   Follow up plan: Return if symptoms worsen or fail to improve.  Guiselle Mian Howell Pringle, MD

## 2023-10-28 ENCOUNTER — Encounter: Payer: Self-pay | Admitting: Pediatrics

## 2023-10-28 ENCOUNTER — Other Ambulatory Visit: Payer: Self-pay | Admitting: Pediatrics

## 2023-10-28 ENCOUNTER — Telehealth: Payer: Self-pay

## 2023-10-28 DIAGNOSIS — B3731 Acute candidiasis of vulva and vagina: Secondary | ICD-10-CM

## 2023-10-28 DIAGNOSIS — N3000 Acute cystitis without hematuria: Secondary | ICD-10-CM

## 2023-10-28 LAB — URINALYSIS, ROUTINE W REFLEX MICROSCOPIC
Bilirubin, UA: NEGATIVE
Glucose, UA: NEGATIVE
Nitrite, UA: NEGATIVE
Specific Gravity, UA: >1.03 — ABNORMAL HIGH (ref 1.005–1.030)
Urobilinogen, Ur: 0.2 mg/dL (ref 0.2–1.0)
pH, UA: 5.5 (ref 5.0–7.5)

## 2023-10-28 LAB — MICROSCOPIC EXAMINATION: WBC, UA: >30 /[HPF] — ABNORMAL HIGH (ref 0–5)

## 2023-10-28 LAB — WET PREP FOR TRICH, YEAST, CLUE
Clue Cell Exam: POSITIVE — AB
Trichomonas Exam: NEGATIVE
Yeast Exam: NEGATIVE

## 2023-10-28 MED ORDER — FLUCONAZOLE 150 MG PO TABS
150.0000 mg | ORAL_TABLET | Freq: Once | ORAL | 0 refills | Status: AC
Start: 2023-10-28 — End: 2023-10-28

## 2023-10-28 MED ORDER — NITROFURANTOIN MONOHYD MACRO 100 MG PO CAPS
100.0000 mg | ORAL_CAPSULE | Freq: Two times a day (BID) | ORAL | 0 refills | Status: AC
Start: 2023-10-28 — End: 2023-11-02

## 2023-10-28 NOTE — Telephone Encounter (Signed)
1st attempt made to reach patient and discuss results. Will try again on next business day. Ok for [facility] to review results if call is returned.     Hi there,  It does look like you have a UTI. I am going to send macrobid to your pharmacy (antibiotic) to take for 5 days. You also had a yeast infection which I recommend treating after you complete the antibiotic. I will send the diflucan (antifungal) medication to take as well for after antibiotic is completed. Let me know if any questions/concerns in the meantime. Dr. Evelene Croon

## 2023-10-28 NOTE — Telephone Encounter (Signed)
Patient notified of lab results and instructions given

## 2023-10-28 NOTE — Progress Notes (Signed)
Macrobid and diflucan sent  Marai Teehan Howell Pringle, MD

## 2023-10-31 LAB — URINE CULTURE

## 2023-11-01 NOTE — Telephone Encounter (Signed)
 Pt called back, advised of message from Dr Evelene Croon. She states UTI sx have resolved for most part, still taking abx. Is having slight vaginal irritation like yeast infection starting but pt waiting to complete abx to take Diflucan and is using vagiseal OTC. Advised if sx don't improve to call back and may can send in different medication. Pt verbalized understanding.   Please call patient and ask if still having urinary symptoms with the antibiotic we sent. The urine culture shows indeterminate coverage of her UTI from the antibiotic. This means it may or may not help her symptoms, if still having symptoms, I will send another antibiotic. If her symptoms, resolved, there is nothing more to do. Thanks!

## 2023-11-01 NOTE — Telephone Encounter (Signed)
 Pt had called in saying she received call from practice but agent was unable to find recent message for call, reviewed chart and found message regarding UTI. Called pt back, LVMTCB to advise of message.

## 2023-11-10 ENCOUNTER — Encounter: Payer: Self-pay | Admitting: Podiatry

## 2023-11-10 ENCOUNTER — Other Ambulatory Visit: Payer: Self-pay | Admitting: Family Medicine

## 2023-11-10 ENCOUNTER — Ambulatory Visit: Payer: Medicare Other | Admitting: Podiatry

## 2023-11-10 DIAGNOSIS — Q828 Other specified congenital malformations of skin: Secondary | ICD-10-CM | POA: Diagnosis not present

## 2023-11-10 MED ORDER — MELOXICAM 15 MG PO TABS: 15.0000 mg | ORAL_TABLET | Freq: Every day | ORAL | 1 refills | Status: DC

## 2023-11-10 NOTE — Progress Notes (Signed)
 Subjective:  Patient ID: Anne Bell, female    DOB: 09/30/1950,  MRN: [id number]  Chief Complaint  Patient presents with   Callouses    "I'm here about my calluses on both my big toes and I have a place on my second toe on my left foot."    73 y.o. female presents with the above complaint.  Patient presents with left second digit porokeratotic lesion painful to touch is progressive and worse worse with ambulation worse with pressure patient would like for me to debride the lesion down she has not seen anyone else prior to seeing me denies any other acute complaints   Review of Systems: Negative except as noted in the HPI. Denies N/V/F/Ch.  Past Medical History:  Diagnosis Date   Allergy    Depression    Hyperlipidemia    Hypertension     Current Outpatient Medications:    amLODipine (NORVASC) 5 MG tablet, Take 1 tablet (5 mg total) by mouth daily., Disp: 100 tablet, Rfl: 1   atorvastatin (LIPITOR) 20 MG tablet, Take 1 tablet (20 mg total) by mouth daily., Disp: 100 tablet, Rfl: 1   fluticasone (FLONASE) 50 MCG/ACT nasal spray, USE 2 SPRAYS IN BOTH NOSTRILS  DAILY, Disp: 48 g, Rfl: 3   hydrOXYzine (VISTARIL) 25 MG capsule, Take 1 capsule (25 mg total) by mouth every 8 (eight) hours as needed., Disp: 90 capsule, Rfl: 1   Lutein 6 MG CAPS, Take 6 mg by mouth daily. , Disp: , Rfl:    meloxicam (MOBIC) 15 MG tablet, Take 15 mg by mouth daily., Disp: , Rfl:    metoprolol succinate (TOPROL-XL) 100 MG 24 hr tablet, Take 1 tablet (100 mg total) by mouth daily. Take with or immediately following a meal., Disp: 100 tablet, Rfl: 1   MULTIPLE VITAMIN PO, Take 1 tablet by mouth daily. , Disp: , Rfl:    oxybutynin (DITROPAN-XL) 10 MG 24 hr tablet, Take 1 tablet (10 mg total) by mouth at bedtime., Disp: 100 tablet, Rfl: 1   valsartan-hydrochlorothiazide (DIOVAN-HCT) 160-12.5 MG tablet, Take 1 tablet by mouth daily., Disp: 100 tablet, Rfl: 1  Social History   Tobacco Use  Smoking Status  Never  Smokeless Tobacco Never    Allergies  Allergen Reactions   Celecoxib     stomach cramps   Prednisone Other (See Comments)   Sulfa Antibiotics     Unknown allergy   Objective:  There were no vitals filed for this visit. There is no height or weight on file to calculate BMI. Constitutional Well developed. Well nourished.  Vascular Dorsalis pedis pulses palpable bilaterally. Posterior tibial pulses palpable bilaterally. Capillary refill normal to all digits.  No cyanosis or clubbing noted. Pedal hair growth normal.  Neurologic Normal speech. Oriented to person, place, and time. Epicritic sensation to light touch grossly present bilaterally.  Dermatologic Left second digit porokeratotic lesion with central nucleated core pain on palpation.  No open wounds or lesion noted  Orthopedic: Normal joint ROM without pain or crepitus bilaterally. No visible deformities. No bony tenderness.   Radiographs: None Assessment:   1. Porokeratosis    Plan:  Patient was evaluated and treated and all questions answered.  Left second digit porokeratosis -All questions against concerns were discussed with the patient in extensive detail given the amount of pain that she is unsure benefit from debridement of the lesion using chisel blade handle the lesion was debrided down to healthy dry tissue.  Complication noted no pinpoint bleeding noted  No follow-ups on file.

## 2023-11-10 NOTE — Telephone Encounter (Signed)
 Copied from CRM [id number]. Topic: Clinical - Medication Refill >> Nov 10, 2023  2:23 PM Gery Pray wrote: Most Recent Primary Care Visit:  Provider: Olevia Perches P  Department: [id number]  Visit Type: OFFICE VISIT  Date: 09/12/2023  Medication: meloxicam (MOBIC) 15 MG tablet   Has the patient contacted their pharmacy? Yes (Agent: If no, request that the patient contact the pharmacy for the refill. If patient does not wish to contact the pharmacy document the reason why and proceed with request.) (Agent: If yes, when and what did the pharmacy advise?)  Is this the correct pharmacy for this prescription? Yes If no, delete pharmacy and type the correct one.  This is the patient's preferred pharmacy:  [facility]) - [place], [place] - [address] [facility] [address] [place] [address] [place] [address] Phone: [phone] Fax: [phone]  Has the prescription been filled recently? No  Is the patient out of the medication? No 5 pills remaining  Has the patient been seen for an appointment in the last year OR does the patient have an upcoming appointment? Yes  Can we respond through MyChart? No please call patient regarding refill [phone]  Agent: Please be advised that Rx refills may take up to 3 business days. We ask that you follow-up with your pharmacy.

## 2023-11-24 DIAGNOSIS — Z1212 Encounter for screening for malignant neoplasm of rectum: Secondary | ICD-10-CM | POA: Diagnosis not present

## 2023-11-24 DIAGNOSIS — Z1211 Encounter for screening for malignant neoplasm of colon: Secondary | ICD-10-CM | POA: Diagnosis not present

## 2023-11-24 LAB — HM COLONOSCOPY

## 2023-11-24 LAB — COLOGUARD: Cologuard: NEGATIVE

## 2023-11-30 LAB — COLOGUARD: COLOGUARD: NEGATIVE

## 2023-11-30 LAB — EXTERNAL GENERIC LAB PROCEDURE: COLOGUARD: NEGATIVE

## 2023-12-06 ENCOUNTER — Encounter: Payer: Self-pay | Admitting: Family Medicine

## 2023-12-12 ENCOUNTER — Other Ambulatory Visit: Payer: Self-pay | Admitting: Family Medicine

## 2023-12-12 DIAGNOSIS — M1712 Unilateral primary osteoarthritis, left knee: Secondary | ICD-10-CM | POA: Diagnosis not present

## 2023-12-12 DIAGNOSIS — J301 Allergic rhinitis due to pollen: Secondary | ICD-10-CM

## 2023-12-14 NOTE — Telephone Encounter (Signed)
 Requested Prescriptions  Pending Prescriptions Disp Refills   fluticasone (FLONASE) 50 MCG/ACT nasal spray [Pharmacy Med Name: Fluticasone Propionate 50 MCG/ACT Nasal Suspension] 48 g 0    Sig: USE 2 SPRAYS IN BOTH NOSTRILS  DAILY     Ear, Nose, and Throat: Nasal Preparations - Corticosteroids Passed - 12/14/2023 11:59 AM      Passed - Valid encounter within last 12 months    Recent Outpatient Visits           1 month ago Urinary tract infection symptoms   [place] [facility] Jackolyn Confer, MD       Future Appointments             In 1 week Dorcas Carrow, DO [facility]

## 2023-12-19 DIAGNOSIS — M1712 Unilateral primary osteoarthritis, left knee: Secondary | ICD-10-CM | POA: Diagnosis not present

## 2023-12-26 ENCOUNTER — Encounter: Payer: Self-pay | Admitting: Family Medicine

## 2023-12-26 ENCOUNTER — Ambulatory Visit: Payer: Medicare Other | Admitting: Family Medicine

## 2023-12-26 ENCOUNTER — Ambulatory Visit (INDEPENDENT_AMBULATORY_CARE_PROVIDER_SITE_OTHER): Payer: Medicare Other | Admitting: Family Medicine

## 2023-12-26 VITALS — BP 158/80 | HR 53 | Ht 61.0 in | Wt 198.8 lb

## 2023-12-26 DIAGNOSIS — D692 Other nonthrombocytopenic purpura: Secondary | ICD-10-CM | POA: Diagnosis not present

## 2023-12-26 DIAGNOSIS — F325 Major depressive disorder, single episode, in full remission: Secondary | ICD-10-CM

## 2023-12-26 DIAGNOSIS — E559 Vitamin D deficiency, unspecified: Secondary | ICD-10-CM

## 2023-12-26 DIAGNOSIS — I129 Hypertensive chronic kidney disease with stage 1 through stage 4 chronic kidney disease, or unspecified chronic kidney disease: Secondary | ICD-10-CM | POA: Diagnosis not present

## 2023-12-26 DIAGNOSIS — E7800 Pure hypercholesterolemia, unspecified: Secondary | ICD-10-CM | POA: Diagnosis not present

## 2023-12-26 DIAGNOSIS — M1712 Unilateral primary osteoarthritis, left knee: Secondary | ICD-10-CM | POA: Diagnosis not present

## 2023-12-26 MED ORDER — METOPROLOL SUCCINATE ER 100 MG PO TB24: 100.0000 mg | ORAL_TABLET | Freq: Every day | ORAL | 1 refills | Status: DC

## 2023-12-26 MED ORDER — ATORVASTATIN CALCIUM 20 MG PO TABS: 20.0000 mg | ORAL_TABLET | Freq: Every day | ORAL | 1 refills | Status: DC

## 2023-12-26 MED ORDER — OXYBUTYNIN CHLORIDE ER 10 MG PO TB24: 10.0000 mg | ORAL_TABLET | Freq: Every day | ORAL | 1 refills | Status: DC

## 2023-12-26 MED ORDER — HYDROXYZINE PAMOATE 25 MG PO CAPS: 25.0000 mg | ORAL_CAPSULE | Freq: Three times a day (TID) | ORAL | 1 refills | Status: DC | PRN

## 2023-12-26 MED ORDER — MELOXICAM 15 MG PO TABS: 15.0000 mg | ORAL_TABLET | Freq: Every day | ORAL | 1 refills | Status: DC

## 2023-12-26 MED ORDER — VALSARTAN-HYDROCHLOROTHIAZIDE 160-12.5 MG PO TABS: 1.0000 | ORAL_TABLET | Freq: Every day | ORAL | 1 refills | Status: DC

## 2023-12-26 MED ORDER — AMLODIPINE BESYLATE 5 MG PO TABS: 5.0000 mg | ORAL_TABLET | Freq: Every day | ORAL | 1 refills | Status: DC

## 2023-12-26 NOTE — Assessment & Plan Note (Signed)
 Rechecking labs today. Await results. Treat as needed.

## 2023-12-26 NOTE — Assessment & Plan Note (Signed)
 Under good control on current regimen. Continue current regimen. Continue to monitor. Call with any concerns. Refills given. Labs drawn today.

## 2023-12-26 NOTE — Assessment & Plan Note (Signed)
 Running a bit high. Will work on [employer] and continue to monitor. Call with any concerns.

## 2023-12-26 NOTE — Assessment & Plan Note (Signed)
 Reassured patient. Continue to monitor.

## 2023-12-26 NOTE — Progress Notes (Signed)
 BP (!) 158/80   Pulse (!) 53   Ht 5\' 1"  (1.549 m)   Wt 198 lb 12.8 oz (90.2 kg)   LMP  (LMP Unknown)   SpO2 98%   BMI 37.56 kg/m    Subjective:    Patient ID: Anne Bell, female    DOB: 09/28/1950, 73 y.o.   MRN: [id number]  HPI: Anne Bell is a 73 y.o. female  No chief complaint on file.  Knee has been doing better with the gel shots. They're lasting a good amount of time- not sure how long.   HYPERTENSION / HYPERLIPIDEMIA Satisfied with current treatment? yes Duration of hypertension: chronic BP monitoring frequency: not checking BP medication side effects: no Past BP meds: amlodipine, metoprolol, valsartan, HCTZ Duration of hyperlipidemia: chronic Cholesterol medication side effects: no Cholesterol supplements: none Past cholesterol medications: atorvastatin Medication compliance: excellent compliance Aspirin: no Recent stressors: no Recurrent headaches: no Visual changes: no Palpitations: no Dyspnea: no Chest pain: no Lower extremity edema: no Dizzy/lightheaded: no  DEPRESSION Mood status: controlled Satisfied with current treatment?: yes Symptom severity: mild  Duration of current treatment : chronic Side effects: no Medication compliance: excellent compliance Psychotherapy/counseling: no  Previous psychiatric medications: hydroxyzine Depressed mood: no Anxious mood: no Anhedonia: no Significant weight loss or gain: no Insomnia: no  Fatigue: no Feelings of worthlessness or guilt: no Impaired concentration/indecisiveness: no Suicidal ideations: no Hopelessness: no Crying spells: no    12/26/2023   10:22 AM 10/27/2023    4:08 PM 09/12/2023    1:47 PM 06/27/2023    8:49 AM 03/08/2023    9:10 AM  Depression screen PHQ 2/9  Decreased Interest 0 0 0 0 0  Down, Depressed, Hopeless 0 0 0 0 0  PHQ - 2 Score 0 0 0 0 0  Altered sleeping 0 0 0 0 0  Tired, decreased energy 0 0 0 0 0  Change in appetite 0 0 0 0 0  Feeling bad or failure about  yourself  0 0 0 0 0  Trouble concentrating 0 0 0 0 0  Moving slowly or fidgety/restless 0 0 0 0 0  Suicidal thoughts 0 0 0 0 0  PHQ-9 Score 0 0 0 0 0  Difficult doing work/chores   Not difficult at all Not difficult at all Not difficult at all     Relevant past medical, surgical, family and social history reviewed and updated as indicated. Interim medical history since our last visit reviewed. Allergies and medications reviewed and updated.  Review of Systems  Constitutional: Negative.   Respiratory: Negative.    Cardiovascular: Negative.   Musculoskeletal:  Positive for arthralgias. Negative for back pain, gait problem, joint swelling, myalgias, neck pain and neck stiffness.  Skin: Negative.   Psychiatric/Behavioral: Negative.      Per HPI unless specifically indicated above     Objective:    BP (!) 158/80   Pulse (!) 53   Ht 5\' 1"  (1.549 m)   Wt 198 lb 12.8 oz (90.2 kg)   LMP  (LMP Unknown)   SpO2 98%   BMI 37.56 kg/m   Wt Readings from Last 3 Encounters:  12/26/23 198 lb 12.8 oz (90.2 kg)  10/27/23 197 lb 6.4 oz (89.5 kg)  09/12/23 201 lb 12.8 oz (91.5 kg)    Physical Exam Vitals and nursing note reviewed.  Constitutional:      General: She is not in acute distress.    Appearance: Normal appearance. She is not  ill-appearing, toxic-appearing or diaphoretic.  HENT:     Head: Normocephalic and atraumatic.     Right Ear: External ear normal.     Left Ear: External ear normal.     Nose: Nose normal.     Mouth/Throat:     Mouth: Mucous membranes are moist.     Pharynx: Oropharynx is clear.  Eyes:     General: No scleral icterus.       Right eye: No discharge.        Left eye: No discharge.     Extraocular Movements: Extraocular movements intact.     Conjunctiva/sclera: Conjunctivae normal.     Pupils: Pupils are equal, round, and reactive to light.  Cardiovascular:     Rate and Rhythm: Normal rate and regular rhythm.     Pulses: Normal pulses.     Heart  sounds: Normal heart sounds. No murmur heard.    No friction rub. No gallop.  Pulmonary:     Effort: Pulmonary effort is normal. No respiratory distress.     Breath sounds: Normal breath sounds. No stridor. No wheezing, rhonchi or rales.  Chest:     Chest wall: No tenderness.  Musculoskeletal:        General: Normal range of motion.     Cervical back: Normal range of motion and neck supple.  Skin:    General: Skin is warm and dry.     Capillary Refill: Capillary refill takes less than 2 seconds.     Coloration: Skin is not jaundiced or pale.     Findings: No bruising, erythema, lesion or rash.  Neurological:     General: No focal deficit present.     Mental Status: She is alert and oriented to person, place, and time. Mental status is at baseline.  Psychiatric:        Mood and Affect: Mood normal.        Behavior: Behavior normal.        Thought Content: Thought content normal.        Judgment: Judgment normal.     Results for orders placed or performed in visit on 12/06/23  Cologuard   Collection Time: 11/24/23 12:00 AM  Result Value Ref Range   Cologuard Negative Negative  HM COLONOSCOPY   Collection Time: 11/24/23 12:00 AM  Result Value Ref Range   HM Colonoscopy See Report (in chart) See Report (in chart), Patient Reported      Assessment & Plan:   Problem List Items Addressed This Visit       Cardiovascular and Mediastinum   Senile purpura (HCC)   Reassured patient. Continue to monitor.       Relevant Medications   amLODipine (NORVASC) 5 MG tablet   atorvastatin (LIPITOR) 20 MG tablet   metoprolol succinate (TOPROL-XL) 100 MG 24 hr tablet   valsartan-hydrochlorothiazide (DIOVAN-HCT) 160-12.5 MG tablet   Other Relevant Orders   Comprehensive metabolic panel with GFR   CBC with Differential/Platelet     Genitourinary   Benign hypertensive renal disease   Running a bit high. Will work on [employer] and continue to monitor. Call with any concerns.        Relevant Medications   amLODipine (NORVASC) 5 MG tablet   metoprolol succinate (TOPROL-XL) 100 MG 24 hr tablet   Other Relevant Orders   Comprehensive metabolic panel with GFR     Other   Major depression in remission (HCC) - Primary   Under good control on current regimen. Continue  current regimen. Continue to monitor. Call with any concerns. Refills given. Labs drawn today.        Relevant Medications   hydrOXYzine (VISTARIL) 25 MG capsule   Other Relevant Orders   Comprehensive metabolic panel with GFR   Hypercholesteremia   Under good control on current regimen. Continue current regimen. Continue to monitor. Call with any concerns. Refills given. Labs drawn today.       Relevant Medications   amLODipine (NORVASC) 5 MG tablet   atorvastatin (LIPITOR) 20 MG tablet   metoprolol succinate (TOPROL-XL) 100 MG 24 hr tablet   valsartan-hydrochlorothiazide (DIOVAN-HCT) 160-12.5 MG tablet   Other Relevant Orders   Comprehensive metabolic panel with GFR   Lipid Panel w/o Chol/HDL Ratio   Avitaminosis D   Rechecking labs today. Await results. Treat as needed.       Relevant Orders   VITAMIN D 25 Hydroxy (Vit-D Deficiency, Fractures)   Comprehensive metabolic panel with GFR     Follow up plan: Return in about 6 months (around 06/26/2024) for physical.

## 2023-12-27 LAB — COMPREHENSIVE METABOLIC PANEL WITH GFR
ALT: 24 IU/L (ref 0–32)
AST: 25 IU/L (ref 0–40)
Albumin: 4.8 g/dL (ref 3.8–4.8)
Alkaline Phosphatase: 108 IU/L (ref 44–121)
BUN/Creatinine Ratio: 30 — ABNORMAL HIGH (ref 12–28)
BUN: 23 mg/dL (ref 8–27)
Bilirubin Total: 0.7 mg/dL (ref 0.0–1.2)
CO2: 26 mmol/L (ref 20–29)
Calcium: 10.9 mg/dL — ABNORMAL HIGH (ref 8.7–10.3)
Chloride: 103 mmol/L (ref 96–106)
Creatinine, Ser: 0.77 mg/dL (ref 0.57–1.00)
Globulin, Total: 2.1 g/dL (ref 1.5–4.5)
Glucose: 89 mg/dL (ref 70–99)
Potassium: 4.8 mmol/L (ref 3.5–5.2)
Sodium: 144 mmol/L (ref 134–144)
Total Protein: 6.9 g/dL (ref 6.0–8.5)
eGFR: 81 mL/min/{1.73_m2} (ref >59)

## 2023-12-27 LAB — CBC WITH DIFFERENTIAL/PLATELET
Basophils Absolute: 0 10*3/uL (ref 0.0–0.2)
Basos: 0 %
EOS (ABSOLUTE): 0.2 10*3/uL (ref 0.0–0.4)
Eos: 3 %
Hematocrit: 46 % (ref 34.0–46.6)
Hemoglobin: 15 g/dL (ref 11.1–15.9)
Immature Grans (Abs): 0 10*3/uL (ref 0.0–0.1)
Immature Granulocytes: 0 %
Lymphocytes Absolute: 1 10*3/uL (ref 0.7–3.1)
Lymphs: 14 %
MCH: 30.9 pg (ref 26.6–33.0)
MCHC: 32.6 g/dL (ref 31.5–35.7)
MCV: 95 fL (ref 79–97)
Monocytes Absolute: 0.4 10*3/uL (ref 0.1–0.9)
Monocytes: 6 %
Neutrophils Absolute: 5.6 10*3/uL (ref 1.4–7.0)
Neutrophils: 77 %
Platelets: 210 10*3/uL (ref 150–450)
RBC: 4.86 x10E6/uL (ref 3.77–5.28)
RDW: 12.8 % (ref 11.7–15.4)
WBC: 7.3 10*3/uL (ref 3.4–10.8)

## 2023-12-27 LAB — LIPID PANEL W/O CHOL/HDL RATIO
Cholesterol, Total: 161 mg/dL (ref 100–199)
HDL: 59 mg/dL (ref >39)
LDL Chol Calc (NIH): 82 mg/dL (ref 0–99)
Triglycerides: 115 mg/dL (ref 0–149)
VLDL Cholesterol Cal: 20 mg/dL (ref 5–40)

## 2023-12-27 LAB — VITAMIN D 25 HYDROXY (VIT D DEFICIENCY, FRACTURES): Vit D, 25-Hydroxy: 38.9 ng/mL (ref 30.0–100.0)

## 2024-01-03 ENCOUNTER — Encounter: Payer: Self-pay | Admitting: Family Medicine

## 2024-01-27 ENCOUNTER — Other Ambulatory Visit: Payer: Self-pay | Admitting: Family Medicine

## 2024-01-27 DIAGNOSIS — Z1231 Encounter for screening mammogram for malignant neoplasm of breast: Secondary | ICD-10-CM

## 2024-02-29 ENCOUNTER — Ambulatory Visit
Admission: RE | Admit: 2024-02-29 | Discharge: 2024-02-29 | Disposition: A | Discharge status: Home / Self Care | Source: Ambulatory Visit | Attending: Family Medicine | Admitting: Family Medicine

## 2024-02-29 DIAGNOSIS — Z1231 Encounter for screening mammogram for malignant neoplasm of breast: Secondary | ICD-10-CM | POA: Insufficient documentation

## 2024-03-02 ENCOUNTER — Ambulatory Visit: Payer: Self-pay | Admitting: Family Medicine

## 2024-03-15 ENCOUNTER — Ambulatory Visit: Payer: Self-pay | Admitting: Emergency Medicine

## 2024-03-15 VITALS — Ht 61.0 in | Wt 200.0 lb

## 2024-03-15 DIAGNOSIS — Z0000 Encounter for general adult medical examination without abnormal findings: Secondary | ICD-10-CM

## 2024-03-15 NOTE — Progress Notes (Signed)
 Subjective:   Anne Bell is a 73 y.o. who presents for a Medicare Wellness preventive visit.  As a reminder, Annual Wellness Visits don't include a physical exam, and some assessments may be limited, especially if this visit is performed virtually. We may recommend an in-person follow-up visit with your provider if needed.  Visit Complete: Virtual I connected with  Anne Bell on 03/15/24 by a audio enabled telemedicine application and verified that I am speaking with the correct person using two identifiers.  Patient Location: Home  Provider Location: Home Office  I discussed the limitations of evaluation and management by telemedicine. The patient expressed understanding and agreed to proceed.  Vital Signs: Because this visit was a virtual/telehealth visit, some criteria may be missing or patient reported. Any vitals not documented were not able to be obtained and vitals that have been documented are patient reported.  VideoDeclined- This patient declined Librarian, academic. Therefore the visit was completed with audio only.  Persons Participating in Visit: Patient.  AWV Questionnaire: No: Patient Medicare AWV questionnaire was not completed prior to this visit.  Cardiac Risk Factors include: advanced age (>43men, >57 women);dyslipidemia;hypertension;obesity (BMI >30kg/m2)     Objective:    Today's Vitals   03/15/24 1344  Weight: 200 lb (90.7 kg)  Height: 5' 1 (1.549 m)  PainSc: 3    Body mass index is 37.79 kg/m.     03/15/2024    1:58 PM 03/08/2023    9:08 AM 02/17/2022   12:44 PM 02/16/2021    1:01 PM 02/13/2020   10:20 AM 02/22/2019   10:26 AM 01/09/2018    9:08 AM  Advanced Directives  Does Patient Have a Medical Advance Directive? Yes No Yes Yes Yes No Yes   Type of Estate agent of [place];Living will  Healthcare Power of [employer] of [place];Living will Living will;Healthcare Power of [employer] Power of [place];Living will  Does patient want to make changes to medical advance directive? Yes (MAU/Ambulatory/Procedural Areas - Information given)        Copy of Healthcare Power of Attorney in Chart? No - copy requested  No - copy requested No - copy requested No - copy requested  No - copy requested   Would patient like information on creating a medical advance directive?  No - Patient declined          Data saved with a previous flowsheet row definition    Current Medications (verified) Outpatient Encounter Medications as of 03/15/2024  Medication Sig   acetaminophen (TYLENOL) 500 MG tablet Take 1,000 mg by mouth every 6 (six) hours as needed.   amLODipine  (NORVASC ) 5 MG tablet Take 1 tablet (5 mg total) by mouth Bell.   atorvastatin  (LIPITOR) 20 MG tablet Take 1 tablet (20 mg total) by mouth Bell.   Calcium  Citrate-Vitamin D  (CITRACAL + D PO) Take 1 tablet by mouth Bell.   fluticasone  (FLONASE ) 50 MCG/ACT nasal spray USE 2 SPRAYS IN BOTH NOSTRILS  Bell   Glucos-Chond-MSM-Bor-D3-Hyalur (MOVE FREE JOINT HEALTH ADV + D PO) Take 1 tablet by mouth Bell.   hydrOXYzine  (VISTARIL ) 25 MG capsule Take 1 capsule (25 mg total) by mouth every 8 (eight) hours as needed.   Lutein 6 MG CAPS Take 6 mg by mouth Bell.    meloxicam  (MOBIC ) 15 MG tablet Take 1 tablet (15 mg total) by mouth Bell.   metoprolol  succinate (TOPROL -XL) 100 MG 24 hr tablet Take 1 tablet (  100 mg total) by mouth Bell. Take with or immediately following a meal.   MULTIPLE VITAMIN PO Take 1 tablet by mouth Bell.    Multiple Vitamins-Minerals (OCUVITE PO) Take 1 tablet by mouth Bell.   oxybutynin  (DITROPAN -XL) 10 MG 24 hr tablet Take 1 tablet (10 mg total) by mouth at bedtime.   valsartan -hydrochlorothiazide  (DIOVAN -HCT) 160-12.5 MG tablet Take 1 tablet by mouth Bell.   No facility-administered encounter medications on file as of 03/15/2024.    Allergies (verified) Celecoxib, Prednisone, and Sulfa  antibiotics   History: Past Medical History:  Diagnosis Date   Allergy    Depression    Hyperlipidemia    Hypertension    Past Surgical History:  Procedure Laterality Date   BREAST CYST ASPIRATION  1970's   ? side   PLANTAR FASCIA SURGERY Left 2008   Family History  Problem Relation Age of Onset   Mental illness Mother        Depression   Cancer Mother        started in back    Alcohol abuse Father    Brain cancer Sister    Breast cancer Neg Hx    Social History   Socioeconomic History   Marital status: Divorced    Spouse name: Not on file   Number of children: 0   Years of education: Not on file   Highest education level: Some college, no degree  Occupational History   Occupation: Retired  Tobacco Use   Smoking status: Never   Smokeless tobacco: Never  Vaping Use   Vaping status: Never Used  Substance and Sexual Activity   Alcohol use: Yes    Comment: occasional, 1 drink monthly or less   Drug use: No   Sexual activity: Yes    Birth control/protection: None  Other Topics Concern   Not on file  Social History Narrative   Not on file   Social Drivers of Health   Financial Resource Strain: Low Risk  (03/15/2024)   Overall Financial Resource Strain (CARDIA)    Difficulty of Paying Living Expenses: Not hard at all  Food Insecurity: No Food Insecurity (03/15/2024)   Hunger Vital Sign    Worried About Running Out of Food in the Last Year: Never true    Ran Out of Food in the Last Year: Never true  Transportation Needs: No Transportation Needs (03/15/2024)   PRAPARE - Administrator, Civil Service (Medical): No    Lack of Transportation (Non-Medical): No  Physical Activity: Inactive (03/15/2024)   Exercise Vital Sign    Days of Exercise per Week: 0 days    Minutes of Exercise per Session: 0 min  Stress: No Stress Concern Present (03/15/2024)   [employer] of Occupational Health - Occupational Stress Questionnaire    Feeling of Stress: Only a  little  Social Connections: Socially Isolated (03/15/2024)   Social Connection and Isolation Panel    Frequency of Communication with Friends and Family: More than three times a week    Frequency of Social Gatherings with Friends and Family: More than three times a week    Attends Religious Services: Never    Database administrator or Organizations: No    Attends Engineer, structural: Never    Marital Status: Divorced    Tobacco Counseling Counseling given: Not Answered    Clinical Intake:  Pre-visit preparation completed: Yes  Pain : 0-10 Pain Score: 3  Pain Type: Chronic pain Pain Location: Knee Pain  Orientation: Left, Right Pain Descriptors / Indicators: Aching     BMI - recorded: 37.79 Nutritional Status: BMI > 30  Obese Nutritional Risks: None Diabetes: No  No results found for: HGBA1C   How often do you need to have someone help you when you read instructions, pamphlets, or other written materials from your doctor or pharmacy?: 1 - Never  Interpreter Needed?: No  Information entered by :: Vina Ned, CMA   Activities of Bell Living     03/15/2024    1:45 PM  In your present state of health, do you have any difficulty performing the following activities:  Hearing? 0  Vision? 0  Difficulty concentrating or making decisions? 0  Walking or climbing stairs? 1  Comment uses a cane  Dressing or bathing? 0  Doing errands, shopping? 0  Preparing Food and eating ? N  Using the Toilet? N  In the past six months, have you accidently leaked urine? Y  Comment wears a pad  Do you have problems with loss of bowel control? N  Managing your Medications? N  Managing your Finances? N  Housekeeping or managing your Housekeeping? N    Patient Care Team: Vicci Duwaine SQUIBB, DO as PCP - General (Family Medicine) Mittie Gaskin, MD as Referring Physician (Ophthalmology) Tobie Franky SQUIBB, DPM as Consulting Physician (Podiatry) Leora Lynwood SAUNDERS, MD as  Referring Physician (Orthopedic Surgery)  I have updated your Care Teams any recent Medical Services you may have received from other providers in the past year.     Assessment:   This is a routine wellness examination for [place].  Hearing/Vision screen Hearing Screening - Comments:: Denies hearing loss  Vision Screening - Comments:: Gets routine eye exams, Dr. Mittie, [facility] [place]   Goals Addressed             This Visit's Progress    Patient Stated       Get knee replacement surgery       Depression Screen     03/15/2024    1:56 PM 12/26/2023   10:22 AM 10/27/2023    4:08 PM 09/12/2023    1:47 PM 06/27/2023    8:49 AM 03/08/2023    9:10 AM 03/08/2023    9:07 AM  PHQ 2/9 Scores  PHQ - 2 Score 0 0 0 0 0 0 0  PHQ- 9 Score 0 0 0 0 0 0 0    Fall Risk     03/15/2024    1:58 PM 10/27/2023    4:07 PM 09/12/2023    1:47 PM 06/27/2023    8:48 AM 03/08/2023    9:09 AM  Fall Risk   Falls in the past year? 1 0 1 1 1   Number falls in past yr: 1 0 1 0 0  Injury with Fall? 1 0  0 0  Risk for fall due to : History of fall(s);Impaired balance/gait;Orthopedic patient;Impaired mobility No Fall Risks History of fall(s) History of fall(s) History of fall(s)  Follow up Falls evaluation completed;Education provided Falls evaluation completed Falls evaluation completed Falls evaluation completed Falls prevention discussed;Falls evaluation completed    MEDICARE RISK AT HOME:  Medicare Risk at Home Any stairs in or around the home?: Yes If so, are there any without handrails?: No Home free of loose throw rugs in walkways, pet beds, electrical cords, etc?: Yes Adequate lighting in your home to reduce risk of falls?: Yes Life alert?: No Use of a cane, walker or w/c?: Yes (cane) Grab bars  in the bathroom?: Yes Shower chair or bench in shower?: No Elevated toilet seat or a handicapped toilet?: Yes  TIMED UP AND GO:  Was the test performed?  No  Cognitive Function: 6CIT completed         03/15/2024    2:00 PM 03/08/2023    9:11 AM 02/17/2022   12:41 PM 02/16/2021    1:03 PM 02/22/2019   10:26 AM  6CIT Screen  What Year? 0 points 0 points 0 points 0 points 0 points  What month? 0 points 0 points 0 points 0 points 0 points  What time? 0 points 0 points 0 points 0 points 0 points  Count back from 20 0 points 0 points 0 points 0 points 0 points  Months in reverse 0 points 0 points 0 points 0 points 0 points  Repeat phrase 0 points 0 points 0 points 2 points 0 points  Total Score 0 points 0 points 0 points 2 points 0 points    Immunizations Immunization History  Administered Date(s) Administered   Fluad Quad(high Dose 65+) 07/18/2021, 06/14/2022   Influenza Whole 06/13/2017   Influenza, High Dose Seasonal PF 06/20/2018, 06/13/2020, 06/13/2023   Influenza-Unspecified 05/31/2015, 06/13/2017, 06/12/2019   Moderna Covid-19 Fall Seasonal Vaccine 33yrs & older 06/15/2022   Moderna SARS-COV2 Booster Vaccination 02/08/2021   Moderna Sars-Covid-2 Vaccination 02/10/2021, 06/15/2022   PFIZER(Purple Top)SARS-COV-2 Vaccination 11/09/2019, 12/04/2019, 08/11/2020   Pneumococcal Conjugate-13 05/31/2015   Pneumococcal Polysaccharide-23 12/28/2016   Tdap 02/24/2016   Unspecified SARS-COV-2 Vaccination 06/13/2023   Zoster Recombinant(Shingrix) 03/05/2021, 06/13/2023   Zoster, Live 05/28/2014    Screening Tests Health Maintenance  Topic Date Due   COVID-19 Vaccine (8 - 2024-25 season) 12/11/2023   INFLUENZA VACCINE  04/13/2024   MAMMOGRAM  02/28/2025   Medicare Annual Wellness (AWV)  03/15/2025   DTaP/Tdap/Td (2 - Td or Tdap) 02/23/2026   Fecal DNA (Cologuard)  11/24/2026   DEXA SCAN  02/24/2027   Pneumococcal Vaccine: 50+ Years  Completed   Hepatitis C Screening  Completed   Zoster Vaccines- Shingrix  Completed   Hepatitis B Vaccines  Aged Out   HPV VACCINES  Aged Out   Meningococcal B Vaccine  Aged Out    Health Maintenance  Health Maintenance Due  Topic Date Due    COVID-19 Vaccine (8 - 2024-25 season) 12/11/2023   Health Maintenance Items Addressed: See Nurse Notes at the end of this note  Additional Screening:  Vision Screening: Recommended annual ophthalmology exams for early detection of glaucoma and other disorders of the eye. Would you like a referral to an eye doctor? No    Dental Screening: Recommended annual dental exams for proper oral hygiene  Community Resource Referral / Chronic Care Management: CRR required this visit?  No   CCM required this visit?  No   Plan:    I have personally reviewed and noted the following in the patient's chart:   Medical and social history Use of alcohol, tobacco or illicit drugs  Current medications and supplements including opioid prescriptions. Patient is not currently taking opioid prescriptions. Functional ability and status Nutritional status Physical activity Advanced directives List of other physicians Hospitalizations, surgeries, and ER visits in previous 12 months Vitals Screenings to include cognitive, depression, and falls Referrals and appointments  In addition, I have reviewed and discussed with patient certain preventive protocols, quality metrics, and best practice recommendations. A written personalized care plan for preventive services as well as general preventive health recommendations were provided to patient.  Vina Ned, CMA   03/15/2024   After Visit Summary: (Mail) Due to this being a telephonic visit, the after visit summary with patients personalized plan was offered to patient via mail   Notes:  Covid and flu vaccines in the fall

## 2024-03-15 NOTE — Patient Instructions (Signed)
 [name] , Thank you for taking time out of your busy schedule to complete your Annual Wellness Visit with me. I enjoyed our conversation and look forward to speaking with you again next year. I, as well as your care team,  appreciate your ongoing commitment to your health goals. Please review the following plan we discussed and let me know if I can assist you in the future. Your Game plan/ To Do List    Referrals: None   Follow up Visits: Next Medicare AWV with our clinical staff: 03/28/25 @ 1:10pm (PHONE VISIT)   Have you seen your provider in the last 6 months (3 months if uncontrolled diabetes)? Yes Next Office Visit with your provider: 06/29/24 @ 9:40am with Dr. Vicci  Clinician Recommendations: Get the covid and flu vaccines in the fall. Keep up the good work!  Aim for 30 minutes of exercise or brisk walking, 6-8 glasses of water, and 5 servings of fruits and vegetables each day.       This is a list of the screening recommended for you and due dates:  Health Maintenance  Topic Date Due   COVID-19 Vaccine (8 - 2024-25 season) 12/11/2023   Flu Shot  04/13/2024   Mammogram  02/28/2025   Medicare Annual Wellness Visit  03/15/2025   DTaP/Tdap/Td vaccine (2 - Td or Tdap) 02/23/2026   Cologuard (Stool DNA test)  11/24/2026   DEXA scan (bone density measurement)  02/24/2027   Pneumococcal Vaccine for age over 74  Completed   Hepatitis C Screening  Completed   Zoster (Shingles) Vaccine  Completed   Hepatitis B Vaccine  Aged Out   HPV Vaccine  Aged Out   Meningitis B Vaccine  Aged Out    Advanced directives: (Copy Requested) Please bring a copy of your health care power of attorney and living will to the office to be added to your chart at your convenience. You can mail to [facility] [address] [place], [place] [address] or email to [email or web address] Advance Care Planning is important because it:  [x]  Makes sure you receive the medical care that is  consistent with your values, goals, and preferences  [x]  It provides guidance to your family and loved ones and reduces their decisional burden about whether or not they are making the right decisions based on your wishes.  Follow the link provided in your after visit summary or read over the paperwork we have mailed to you to help you started getting your Advance Directives in place. If you need assistance in completing these, please reach out to us  so that we can help you!  See attachments for Preventive Care and Fall Prevention Tips.   Fall Prevention in the Home, Adult Falls can cause injuries and affect people of all ages. There are many simple things that you can do to make your home safe and to help prevent falls. If you need it, ask for help making these changes. What actions can I take to prevent falls? General information Use good lighting in all rooms. Make sure to: Replace any light bulbs that burn out. Turn on lights if it is dark and use night-lights. Keep items that you use often in easy-to-reach places. Lower the shelves around your home if needed. Move furniture so that there are clear paths around it. Do not keep throw rugs or other things on the floor that can make you trip. If any of your floors are uneven, fix them. Add color  or contrast paint or tape to clearly mark and help you see: Grab bars or handrails. First and last steps of staircases. Where the edge of each step is. If you use a ladder or stepladder: Make sure that it is fully opened. Do not climb a closed ladder. Make sure the sides of the ladder are locked in place. Have someone hold the ladder while you use it. Know where your pets are as you move through your home. What can I do in the bathroom?     Keep the floor dry. Clean up any water that is on the floor right away. Remove soap buildup in the bathtub or shower. Buildup makes bathtubs and showers slippery. Use non-skid mats or decals on the  floor of the bathtub or shower. Attach bath mats securely with double-sided, non-slip rug tape. If you need to sit down while you are in the shower, use a non-slip stool. Install grab bars by the toilet and in the bathtub and shower. Do not use towel bars as grab bars. What can I do in the bedroom? Make sure that you have a light by your bed that is easy to reach. Do not use any sheets or blankets on your bed that hang to the floor. Have a firm bench or chair with side arms that you can use for support when you get dressed. What can I do in the kitchen? Clean up any spills right away. If you need to reach something above you, use a sturdy step stool that has a grab bar. Keep electrical cables out of the way. Do not use floor polish or wax that makes floors slippery. What can I do with my stairs? Do not leave anything on the stairs. Make sure that you have a light switch at the top and the bottom of the stairs. Have them installed if you do not have them. Make sure that there are handrails on both sides of the stairs. Fix handrails that are broken or loose. Make sure that handrails are as long as the staircases. Install non-slip stair treads on all stairs in your home if they do not have carpet. Avoid having throw rugs at the top or bottom of stairs, or secure the rugs with carpet tape to prevent them from moving. Choose a carpet design that does not hide the edge of steps on the stairs. Make sure that carpet is firmly attached to the stairs. Fix any carpet that is loose or worn. What can I do on the outside of my home? Use bright outdoor lighting. Repair the edges of walkways and driveways and fix any cracks. Clear paths of anything that can make you trip, such as tools or rocks. Add color or contrast paint or tape to clearly mark and help you see high doorway thresholds. Trim any bushes or trees on the main path into your home. Check that handrails are securely fastened and in good repair.  Both sides of all steps should have handrails. Install guardrails along the edges of any raised decks or porches. Have leaves, snow, and ice cleared regularly. Use sand, salt, or ice melt on walkways during winter months if you live where there is ice and snow. In the garage, clean up any spills right away, including grease or oil spills. What other actions can I take? Review your medicines with your health care provider. Some medicines can make you confused or feel dizzy. This can increase your chance of falling. Wear closed-toe shoes that fit well  and support your feet. Wear shoes that have rubber soles and low heels. Use a cane, walker, scooter, or crutches that help you move around if needed. Talk with your provider about other ways that you can decrease your risk of falls. This may include seeing a physical therapist to learn to do exercises to improve movement and strength. Where to find more information Centers for Disease Control and Prevention, STEADI: TonerPromos.no [employer] on Aging: BaseRingTones.pl National Institute on Aging: BaseRingTones.pl Contact a health care provider if: You are afraid of falling at home. You feel weak, drowsy, or dizzy at home. You fall at home. Get help right away if you: Lose consciousness or have trouble moving after a fall. Have a fall that causes a head injury. These symptoms may be an emergency. Get help right away. Call 911. Do not wait to see if the symptoms will go away. Do not drive yourself to the hospital. This information is not intended to replace advice given to you by your health care provider. Make sure you discuss any questions you have with your health care provider. Document Revised: 05/03/2022 Document Reviewed: 05/03/2022 Elsevier Patient Education  2024 [employer].

## 2024-04-06 DIAGNOSIS — M1712 Unilateral primary osteoarthritis, left knee: Secondary | ICD-10-CM | POA: Diagnosis not present

## 2024-04-07 ENCOUNTER — Other Ambulatory Visit: Payer: Self-pay | Admitting: Family Medicine

## 2024-04-07 DIAGNOSIS — J301 Allergic rhinitis due to pollen: Secondary | ICD-10-CM

## 2024-04-09 NOTE — Telephone Encounter (Signed)
 Requested Prescriptions  Pending Prescriptions Disp Refills   fluticasone  (FLONASE ) 50 MCG/ACT nasal spray [Pharmacy Med Name: Fluticasone  Propionate 50 MCG/ACT Nasal Suspension] 48 g 0    Sig: USE 2 SPRAYS IN BOTH NOSTRILS  DAILY     Ear, Nose, and Throat: Nasal Preparations - Corticosteroids Passed - 04/09/2024  3:34 PM      Passed - Valid encounter within last 12 months    Recent Outpatient Visits           3 months ago Major depression in remission [facility])   [facility] [place], Megan P, DO   5 months ago Urinary tract infection symptoms   [facility] Herold Hadassah SQUIBB, MD

## 2024-05-03 DIAGNOSIS — Z01818 Encounter for other preprocedural examination: Secondary | ICD-10-CM | POA: Diagnosis not present

## 2024-05-03 DIAGNOSIS — Z0189 Encounter for other specified special examinations: Secondary | ICD-10-CM | POA: Diagnosis not present

## 2024-05-10 DIAGNOSIS — E785 Hyperlipidemia, unspecified: Secondary | ICD-10-CM | POA: Diagnosis not present

## 2024-05-10 DIAGNOSIS — G4733 Obstructive sleep apnea (adult) (pediatric): Secondary | ICD-10-CM | POA: Diagnosis not present

## 2024-05-10 DIAGNOSIS — I10 Essential (primary) hypertension: Secondary | ICD-10-CM | POA: Diagnosis not present

## 2024-05-10 DIAGNOSIS — N3281 Overactive bladder: Secondary | ICD-10-CM | POA: Diagnosis not present

## 2024-05-10 DIAGNOSIS — M1712 Unilateral primary osteoarthritis, left knee: Secondary | ICD-10-CM | POA: Diagnosis not present

## 2024-05-10 DIAGNOSIS — M25762 Osteophyte, left knee: Secondary | ICD-10-CM | POA: Diagnosis not present

## 2024-05-10 DIAGNOSIS — G8918 Other acute postprocedural pain: Secondary | ICD-10-CM | POA: Diagnosis not present

## 2024-05-11 DIAGNOSIS — E785 Hyperlipidemia, unspecified: Secondary | ICD-10-CM | POA: Diagnosis not present

## 2024-05-11 DIAGNOSIS — N3281 Overactive bladder: Secondary | ICD-10-CM | POA: Diagnosis not present

## 2024-05-11 DIAGNOSIS — M25762 Osteophyte, left knee: Secondary | ICD-10-CM | POA: Diagnosis not present

## 2024-05-11 DIAGNOSIS — G4733 Obstructive sleep apnea (adult) (pediatric): Secondary | ICD-10-CM | POA: Diagnosis not present

## 2024-05-11 DIAGNOSIS — I10 Essential (primary) hypertension: Secondary | ICD-10-CM | POA: Diagnosis not present

## 2024-05-11 DIAGNOSIS — M1712 Unilateral primary osteoarthritis, left knee: Secondary | ICD-10-CM | POA: Diagnosis not present

## 2024-05-16 DIAGNOSIS — M25562 Pain in left knee: Secondary | ICD-10-CM | POA: Diagnosis not present

## 2024-05-17 ENCOUNTER — Ambulatory Visit: Payer: Self-pay

## 2024-05-17 NOTE — Telephone Encounter (Signed)
 Routing to provider for any further recommendations for the patient.

## 2024-05-17 NOTE — Telephone Encounter (Signed)
 FYI Only or Action Required?: Action required by provider: update on patient condition.  Patient was last seen in primary care on 12/26/2023 by Anne Duwaine SQUIBB, DO.  Called Nurse Triage reporting Constipation.  Symptoms began a week ago.  Interventions attempted: OTC medications: Doculax and Rest, hydration, or home remedies.  Symptoms are: unchanged.  Triage Disposition: See Physician Within 24 Hours-unable to make an appointment due to patient availability.   Patient/caregiver understands and will follow disposition?: No, wishes to speak with PCP  Copied from CRM [id number]. Topic: Clinical - Red Word Triage >> May 17, 2024  8:45 AM Anne Bell wrote: Pt had a total knee replacement last Thursday and since then she hasn't been able to have a bowel movement Reason for Disposition  Last bowel movement (BM) > 4 days ago  Answer Assessment - Initial Assessment Questions 1. STOOL PATTERN OR FREQUENCY: How often do you have a bowel movement (BM)?  (Normal range: 3 times a day to every 3 days)  When was your last BM?       Patient reports usually once a day-last BM 05/09/2024 2. STRAINING: Do you have to strain to have a BM?      sometimes 3. ONSET: When did the constipation begin?     Hasn't had a BM since 05/10/2024 4. RECTAL PAIN: Does your rectum hurt when the stool comes out? If Yes, ask: Do you have hemorrhoids? How bad is the pain?  (Scale 1-10; or mild, moderate, severe)     NA 5. BM COMPOSITION: Are the stools hard?      NA 6. BLOOD ON STOOLS: Has there been any blood on the toilet tissue or on the surface of the BM? If Yes, ask: When was the last time?     NA 7. CHRONIC CONSTIPATION: Is this a new problem for you?  If No, ask: How long have you had this problem? (days, weeks, months)      New problem 8. CHANGES IN DIET OR HYDRATION: Have there been any recent changes in your diet? How much fluids are you drinking on a daily basis?  How much have you had  to drink today?     Drinks 32 oz of water along with tea and other fluids.  9. MEDICINES: Have you been taking any new medicines? Are you taking any narcotic pain medicines? (e.g., Dilaudid, morphine, Percocet, Vicodin)     Oxycodone, methocarbamol  10. LAXATIVES: Have you been using any stool softeners, laxatives, or enemas?  If Yes, ask What are you using, how often, and when was the last time?       Doculax-taken three so far-has taken one today.   11. ACTIVITY:  How much walking do you do every day?  Has your activity level decreased in the past week?       Patient reports moving around about 4 hours total for the day.  12. CAUSE: What do you think is causing the constipation?        One week post op since total knee replacement 13. MEDICAL HISTORY: Do you have a history of hemorrhoids, rectal fissures, rectal surgery, or rectal abscess?         no 14. OTHER SYMPTOMS: Do you have any other symptoms? (e.g., abdomen pain, bloating, fever, vomiting)       No  Patient is one week post op from total knee replacement. Patient reports constipation that started post surgery. Patient reports taking Doculax with no improvement. Discussed with patient  needing to be seen in office. Patient unable to be scheduled to see provider due to availability of patient. Patient given care advise for OTC medications that can be used to treat constipation. Patient verbalized understanding and would like a follow up call from PCP.  Protocols used: Constipation-A-AH

## 2024-05-20 NOTE — Telephone Encounter (Signed)
 Ok to try miralax 1 cap full 3x a day. If no BM in 1-2 days will need to be seen

## 2024-05-22 ENCOUNTER — Other Ambulatory Visit: Payer: Self-pay | Admitting: Orthopedic Surgery

## 2024-05-22 ENCOUNTER — Ambulatory Visit
Admission: RE | Admit: 2024-05-22 | Discharge: 2024-05-22 | Disposition: A | Discharge status: Home / Self Care | Source: Ambulatory Visit | Attending: Orthopedic Surgery | Admitting: Orthopedic Surgery

## 2024-05-22 DIAGNOSIS — M7989 Other specified soft tissue disorders: Secondary | ICD-10-CM

## 2024-05-22 DIAGNOSIS — M79662 Pain in left lower leg: Secondary | ICD-10-CM | POA: Insufficient documentation

## 2024-05-22 DIAGNOSIS — M25562 Pain in left knee: Secondary | ICD-10-CM | POA: Diagnosis not present

## 2024-05-24 DIAGNOSIS — M25562 Pain in left knee: Secondary | ICD-10-CM | POA: Diagnosis not present

## 2024-05-29 DIAGNOSIS — M25562 Pain in left knee: Secondary | ICD-10-CM | POA: Diagnosis not present

## 2024-05-31 DIAGNOSIS — M25562 Pain in left knee: Secondary | ICD-10-CM | POA: Diagnosis not present

## 2024-06-05 DIAGNOSIS — M25562 Pain in left knee: Secondary | ICD-10-CM | POA: Diagnosis not present

## 2024-06-07 DIAGNOSIS — M25562 Pain in left knee: Secondary | ICD-10-CM | POA: Diagnosis not present

## 2024-06-12 DIAGNOSIS — M25562 Pain in left knee: Secondary | ICD-10-CM | POA: Diagnosis not present

## 2024-06-14 DIAGNOSIS — M25562 Pain in left knee: Secondary | ICD-10-CM | POA: Diagnosis not present

## 2024-06-18 DIAGNOSIS — Z96652 Presence of left artificial knee joint: Secondary | ICD-10-CM | POA: Diagnosis not present

## 2024-06-19 DIAGNOSIS — M25562 Pain in left knee: Secondary | ICD-10-CM | POA: Diagnosis not present

## 2024-06-21 DIAGNOSIS — M25562 Pain in left knee: Secondary | ICD-10-CM | POA: Diagnosis not present

## 2024-06-26 DIAGNOSIS — M25562 Pain in left knee: Secondary | ICD-10-CM | POA: Diagnosis not present

## 2024-06-28 DIAGNOSIS — M25562 Pain in left knee: Secondary | ICD-10-CM | POA: Diagnosis not present

## 2024-06-29 ENCOUNTER — Encounter: Payer: Self-pay | Admitting: Family Medicine

## 2024-06-29 ENCOUNTER — Ambulatory Visit (INDEPENDENT_AMBULATORY_CARE_PROVIDER_SITE_OTHER): Admitting: Family Medicine

## 2024-06-29 VITALS — BP 132/86 | HR 54 | Temp 98.0°F | Ht 61.0 in | Wt 191.2 lb

## 2024-06-29 DIAGNOSIS — D692 Other nonthrombocytopenic purpura: Secondary | ICD-10-CM | POA: Diagnosis not present

## 2024-06-29 DIAGNOSIS — Z0000 Encounter for general adult medical examination without abnormal findings: Secondary | ICD-10-CM

## 2024-06-29 DIAGNOSIS — R42 Dizziness and giddiness: Secondary | ICD-10-CM | POA: Diagnosis not present

## 2024-06-29 DIAGNOSIS — E7800 Pure hypercholesterolemia, unspecified: Secondary | ICD-10-CM

## 2024-06-29 DIAGNOSIS — Z23 Encounter for immunization: Secondary | ICD-10-CM | POA: Diagnosis not present

## 2024-06-29 DIAGNOSIS — E559 Vitamin D deficiency, unspecified: Secondary | ICD-10-CM | POA: Diagnosis not present

## 2024-06-29 DIAGNOSIS — F325 Major depressive disorder, single episode, in full remission: Secondary | ICD-10-CM

## 2024-06-29 DIAGNOSIS — I129 Hypertensive chronic kidney disease with stage 1 through stage 4 chronic kidney disease, or unspecified chronic kidney disease: Secondary | ICD-10-CM | POA: Diagnosis not present

## 2024-06-29 DIAGNOSIS — J301 Allergic rhinitis due to pollen: Secondary | ICD-10-CM

## 2024-06-29 LAB — MICROALBUMIN, URINE WAIVED
Creatinine, Urine Waived: 100 mg/dL (ref 10–300)
Microalb, Ur Waived: 80 mg/L — ABNORMAL HIGH (ref 0–19)

## 2024-06-29 LAB — BAYER DCA HB A1C WAIVED: HB A1C (BAYER DCA - WAIVED): 4.7 % — ABNORMAL LOW (ref 4.8–5.6)

## 2024-06-29 MED ORDER — HYDROXYZINE PAMOATE 25 MG PO CAPS: 25.0000 mg | ORAL_CAPSULE | Freq: Three times a day (TID) | ORAL | 1 refills | Status: AC | PRN

## 2024-06-29 MED ORDER — MELOXICAM 15 MG PO TABS: 15.0000 mg | ORAL_TABLET | Freq: Every day | ORAL | 1 refills | Status: AC

## 2024-06-29 MED ORDER — OXYBUTYNIN CHLORIDE ER 10 MG PO TB24: 10.0000 mg | ORAL_TABLET | Freq: Every day | ORAL | 1 refills | Status: AC

## 2024-06-29 MED ORDER — VALSARTAN-HYDROCHLOROTHIAZIDE 160-12.5 MG PO TABS: 1.0000 | ORAL_TABLET | Freq: Every day | ORAL | 1 refills | Status: AC

## 2024-06-29 MED ORDER — METOPROLOL SUCCINATE ER 100 MG PO TB24: 100.0000 mg | ORAL_TABLET | Freq: Every day | ORAL | 1 refills | Status: AC

## 2024-06-29 MED ORDER — ATORVASTATIN CALCIUM 20 MG PO TABS: 20.0000 mg | ORAL_TABLET | Freq: Every day | ORAL | 1 refills | Status: AC

## 2024-06-29 MED ORDER — AMLODIPINE BESYLATE 5 MG PO TABS: 5.0000 mg | ORAL_TABLET | Freq: Every day | ORAL | 1 refills | Status: AC

## 2024-06-29 MED ORDER — FLUTICASONE PROPIONATE 50 MCG/ACT NA SUSP: 2.0000 | Freq: Every day | NASAL | 12 refills | Status: AC

## 2024-06-29 NOTE — Progress Notes (Signed)
 BP 132/86   Pulse (!) 54   Temp 98 F (36.7 C) (Oral)   Ht 5' 1 (1.549 m)   Wt 191 lb 3.2 oz (86.7 kg)   LMP  (LMP Unknown)   SpO2 100%   BMI 36.13 kg/m    Subjective:    Patient ID: Anne Bell, female    DOB: [birth year], 73 y.o.   MRN: [id number]  HPI: Anne Bell is a 73 y.o. female presenting on 06/29/2024 for comprehensive medical examination. Current medical complaints include:  DIZZINESS Duration: about a year Description of symptoms: off kilter Duration of episode: seconds Dizziness frequency: recurrent- 2x a month Provoking factors: unsure Aggravating factors:  moving and looking up Triggered by rolling over in bed: no Triggered by bending over: no Aggravated by head movement: yes Aggravated by exertion, coughing, loud noises: no Recent head injury: no Recent or current viral symptoms: no History of vasovagal episodes: no Nausea: no Vomiting: no Tinnitus: no Hearing loss: no Aural fullness: no Headache: no Photophobia/phonophobia: no Unsteady gait: yes Postural instability: yes Diplopia, dysarthria, dysphagia or weakness: no Related to exertion: no Pallor: no Diaphoresis: no Dyspnea: no Chest pain: no  HYPERTENSION / HYPERLIPIDEMIA Satisfied with current treatment? yes Duration of hypertension: chronic BP monitoring frequency: not checking BP medication side effects: no Past BP meds: amlodipine , metoprolol , valsartan , HCTZ Duration of hyperlipidemia: chronic Cholesterol medication side effects: no Cholesterol supplements: none Past cholesterol medications: atorvastatin  Medication compliance: excellent compliance Aspirin: yes Recent stressors: yes Recurrent headaches: no Visual changes: no Palpitations: no Dyspnea: no Chest pain: no Lower extremity edema: no Dizzy/lightheaded: yes  DEPRESSION Mood status: exacerbated Satisfied with current treatment?: yes Symptom severity: moderate  Duration of current treatment : chronic Side  effects: no Medication compliance: good compliance Psychotherapy/counseling: no  Previous psychiatric medications: hydroxyzine  Depressed mood: yes Anxious mood: yes Anhedonia: no Significant weight loss or gain: no Insomnia: no  Fatigue: yes Feelings of worthlessness or guilt: no Impaired concentration/indecisiveness: no Suicidal ideations: no Hopelessness: no Crying spells: no    03/15/2024    1:56 PM 12/26/2023   10:22 AM 10/27/2023    4:08 PM 09/12/2023    1:47 PM 06/27/2023    8:49 AM  Depression screen PHQ 2/9  Decreased Interest 0 0 0 0 0  Down, Depressed, Hopeless 0 0 0 0 0  PHQ - 2 Score 0 0 0 0 0  Altered sleeping 0 0 0 0 0  Tired, decreased energy 0 0 0 0 0  Change in appetite 0 0 0 0 0  Feeling bad or failure about yourself   0 0 0 0  Trouble concentrating 0 0 0 0 0  Moving slowly or fidgety/restless 0 0 0 0 0  Suicidal thoughts 0 0 0 0 0  PHQ-9 Score 0 0 0 0 0  Difficult doing work/chores    Not difficult at all Not difficult at all    She currently lives with: alone Menopausal Symptoms: no  Depression Screen done today and results listed below:     03/15/2024    1:56 PM 12/26/2023   10:22 AM 10/27/2023    4:08 PM 09/12/2023    1:47 PM 06/27/2023    8:49 AM  Depression screen PHQ 2/9  Decreased Interest 0 0 0 0 0  Down, Depressed, Hopeless 0 0 0 0 0  PHQ - 2 Score 0 0 0 0 0  Altered sleeping 0 0 0 0 0  Tired, decreased energy 0 0 0  0 0  Change in appetite 0 0 0 0 0  Feeling bad or failure about yourself   0 0 0 0  Trouble concentrating 0 0 0 0 0  Moving slowly or fidgety/restless 0 0 0 0 0  Suicidal thoughts 0 0 0 0 0  PHQ-9 Score 0 0 0 0 0  Difficult doing work/chores    Not difficult at all Not difficult at all    Past Medical History:  Past Medical History:  Diagnosis Date   Allergy    Depression    Hyperlipidemia    Hypertension     Surgical History:  Past Surgical History:  Procedure Laterality Date   BREAST CYST ASPIRATION  1970's    ? side   PLANTAR FASCIA SURGERY Left 2008    Medications:  Current Outpatient Medications on File Prior to Visit  Medication Sig   acetaminophen (TYLENOL) 500 MG tablet Take 1,000 mg by mouth every 6 (six) hours as needed.   Aspirin 81 MG CAPS Take 1 capsule twice a day by oral route for 30 days.   Calcium  Citrate-Vitamin D  (CITRACAL + D PO) Take 1 tablet by mouth daily.   docusate sodium (COLACE) 100 MG capsule Take 1 capsule twice a day by oral route.   Glucos-Chond-MSM-Bor-D3-Hyalur (MOVE FREE JOINT HEALTH ADV + D PO) Take 1 tablet by mouth daily.   Lutein 6 MG CAPS Take 6 mg by mouth daily.    methocarbamol (ROBAXIN) 500 MG tablet TAKE ONE TABLET BY MOUTH EVERY 8 HOURS AS NEEDED FOR MUSCLE SPASM   MULTIPLE VITAMIN PO Take 1 tablet by mouth daily.    Multiple Vitamins-Minerals (OCUVITE PO) Take 1 tablet by mouth daily.   oxyCODONE (OXY IR/ROXICODONE) 5 MG immediate release tablet Take 5 mg by mouth every 4 (four) hours as needed. for pain   No current facility-administered medications on file prior to visit.    Allergies:  Allergies  Allergen Reactions   Celecoxib     stomach cramps   Prednisone Other (See Comments)   Sulfa Antibiotics     Unknown allergy    Social History:  Social History   Socioeconomic History   Marital status: Divorced    Spouse name: Not on file   Number of children: 0   Years of education: Not on file   Highest education level: Some college, no degree  Occupational History   Occupation: Retired  Tobacco Use   Smoking status: Never   Smokeless tobacco: Never  Vaping Use   Vaping status: Never Used  Substance and Sexual Activity   Alcohol use: Yes    Comment: occasional, 1 drink monthly or less   Drug use: No   Sexual activity: Yes    Birth control/protection: None  Other Topics Concern   Not on file  Social History Narrative   Not on file   Social Drivers of Health   Financial Resource Strain: Low Risk  (03/15/2024)   Overall  Financial Resource Strain (CARDIA)    Difficulty of Paying Living Expenses: Not hard at all  Food Insecurity: No Food Insecurity (03/15/2024)   Hunger Vital Sign    Worried About Running Out of Food in the Last Year: Never true    Ran Out of Food in the Last Year: Never true  Transportation Needs: No Transportation Needs (03/15/2024)   PRAPARE - Administrator, Civil Service (Medical): No    Lack of Transportation (Non-Medical): No  Physical Activity: Inactive (03/15/2024)  Exercise Vital Sign    Days of Exercise per Week: 0 days    Minutes of Exercise per Session: 0 min  Stress: No Stress Concern Present (03/15/2024)   [employer] of Occupational Health - Occupational Stress Questionnaire    Feeling of Stress: Only a little  Social Connections: Socially Isolated (03/15/2024)   Social Connection and Isolation Panel    Frequency of Communication with Friends and Family: More than three times a week    Frequency of Social Gatherings with Friends and Family: More than three times a week    Attends Religious Services: Never    Database administrator or Organizations: No    Attends Banker Meetings: Never    Marital Status: Divorced  Catering manager Violence: Not At Risk (03/15/2024)   Humiliation, Afraid, Rape, and Kick questionnaire    Fear of Current or Ex-Partner: No    Emotionally Abused: No    Physically Abused: No    Sexually Abused: No   Social History   Tobacco Use  Smoking Status Never  Smokeless Tobacco Never   Social History   Substance and Sexual Activity  Alcohol Use Yes   Comment: occasional, 1 drink monthly or less    Family History:  Family History  Problem Relation Age of Onset   Mental illness Mother        Depression   Cancer Mother        started in back    Alcohol abuse Father    Brain cancer Sister    Breast cancer Neg Hx     Past medical history, surgical history, medications, allergies, family history and social history  reviewed with patient today and changes made to appropriate areas of the chart.   Review of Systems  Constitutional:  Positive for chills. Negative for diaphoresis, fever, malaise/fatigue and weight loss.  HENT: Negative.    Eyes: Negative.   Respiratory: Negative.    Cardiovascular: Negative.   Gastrointestinal: Negative.   Genitourinary: Negative.   Musculoskeletal:  Positive for myalgias. Negative for back pain, falls, joint pain and neck pain.  Skin: Negative.   Neurological:  Positive for dizziness. Negative for tingling, tremors, sensory change, speech change, focal weakness, seizures, loss of consciousness, weakness and headaches.  Endo/Heme/Allergies:  Negative for environmental allergies and polydipsia. Bruises/bleeds easily.  Psychiatric/Behavioral:  Positive for depression. Negative for hallucinations, memory loss, substance abuse and suicidal ideas. The patient is not nervous/anxious and does not have insomnia.    All other ROS negative except what is listed above and in the HPI.      Objective:    BP 132/86   Pulse (!) 54   Temp 98 F (36.7 C) (Oral)   Ht 5' 1 (1.549 m)   Wt 191 lb 3.2 oz (86.7 kg)   LMP  (LMP Unknown)   SpO2 100%   BMI 36.13 kg/m   Wt Readings from Last 3 Encounters:  06/29/24 191 lb 3.2 oz (86.7 kg)  03/15/24 200 lb (90.7 kg)  12/26/23 198 lb 12.8 oz (90.2 kg)    Physical Exam Vitals and nursing note reviewed.  Constitutional:      General: She is not in acute distress.    Appearance: Normal appearance. She is obese. She is not ill-appearing, toxic-appearing or diaphoretic.  HENT:     Head: Normocephalic and atraumatic.     Right Ear: Ear canal and external ear normal. A middle ear effusion is present. There is no impacted cerumen.  Left Ear: Tympanic membrane, ear canal and external ear normal. There is no impacted cerumen.     Nose: Nose normal. No congestion or rhinorrhea.     Mouth/Throat:     Mouth: Mucous membranes are moist.      Pharynx: Oropharynx is clear. No oropharyngeal exudate or posterior oropharyngeal erythema.  Eyes:     General: No scleral icterus.       Right eye: No discharge.        Left eye: No discharge.     Extraocular Movements: Extraocular movements intact.     Conjunctiva/sclera: Conjunctivae normal.     Pupils: Pupils are equal, round, and reactive to light.  Neck:     Vascular: No carotid bruit.  Cardiovascular:     Rate and Rhythm: Normal rate and regular rhythm.     Pulses: Normal pulses.     Heart sounds: No murmur heard.    No friction rub. No gallop.  Pulmonary:     Effort: Pulmonary effort is normal. No respiratory distress.     Breath sounds: Normal breath sounds. No stridor. No wheezing, rhonchi or rales.  Chest:     Chest wall: No tenderness.  Abdominal:     General: Abdomen is flat. Bowel sounds are normal. There is no distension.     Palpations: Abdomen is soft. There is no mass.     Tenderness: There is no abdominal tenderness. There is no right CVA tenderness, left CVA tenderness, guarding or rebound.     Hernia: No hernia is present.  Genitourinary:    Comments: Breast and pelvic exams deferred with shared decision making Musculoskeletal:        General: No swelling, tenderness, deformity or signs of injury.     Cervical back: Normal range of motion and neck supple. No rigidity. No muscular tenderness.     Right lower leg: No edema.     Left lower leg: No edema.  Lymphadenopathy:     Cervical: No cervical adenopathy.  Skin:    General: Skin is warm and dry.     Capillary Refill: Capillary refill takes less than 2 seconds.     Coloration: Skin is not jaundiced or pale.     Findings: No bruising, erythema, lesion or rash.  Neurological:     General: No focal deficit present.     Mental Status: She is alert and oriented to person, place, and time. Mental status is at baseline.     Cranial Nerves: No cranial nerve deficit.     Sensory: No sensory deficit.      Motor: No weakness.     Coordination: Coordination normal.     Gait: Gait normal.     Deep Tendon Reflexes: Reflexes normal.  Psychiatric:        Mood and Affect: Mood normal.        Behavior: Behavior normal.        Thought Content: Thought content normal.        Judgment: Judgment normal.     Results for orders placed or performed in visit on 12/26/23  VITAMIN D  25 Hydroxy (Vit-D Deficiency, Fractures)   Collection Time: 12/26/23 10:43 AM  Result Value Ref Range   Vit D, 25-Hydroxy 38.9 30.0 - 100.0 ng/mL  Comprehensive metabolic panel with GFR   Collection Time: 12/26/23 10:43 AM  Result Value Ref Range   Glucose 89 70 - 99 mg/dL   BUN 23 8 - 27 mg/dL   Creatinine, Ser 9.22 0.57 -  1.00 mg/dL   eGFR 81 >40 fO/fpw/8.26   BUN/Creatinine Ratio 30 (H) 12 - 28   Sodium 144 134 - 144 mmol/L   Potassium 4.8 3.5 - 5.2 mmol/L   Chloride 103 96 - 106 mmol/L   CO2 26 20 - 29 mmol/L   Calcium  10.9 (H) 8.7 - 10.3 mg/dL   Total Protein 6.9 6.0 - 8.5 g/dL   Albumin 4.8 3.8 - 4.8 g/dL   Globulin, Total 2.1 1.5 - 4.5 g/dL   Bilirubin Total 0.7 0.0 - 1.2 mg/dL   Alkaline Phosphatase 108 44 - 121 IU/L   AST 25 0 - 40 IU/L   ALT 24 0 - 32 IU/L  CBC with Differential/Platelet   Collection Time: 12/26/23 10:43 AM  Result Value Ref Range   WBC 7.3 3.4 - 10.8 x10E3/uL   RBC 4.86 3.77 - 5.28 x10E6/uL   Hemoglobin 15.0 11.1 - 15.9 g/dL   Hematocrit 53.9 65.9 - 46.6 %   MCV 95 79 - 97 fL   MCH 30.9 26.6 - 33.0 pg   MCHC 32.6 31.5 - 35.7 g/dL   RDW 87.1 88.2 - 84.5 %   Platelets 210 150 - 450 x10E3/uL   Neutrophils 77 Not Estab. %   Lymphs 14 Not Estab. %   Monocytes 6 Not Estab. %   Eos 3 Not Estab. %   Basos 0 Not Estab. %   Neutrophils Absolute 5.6 1.4 - 7.0 x10E3/uL   Lymphocytes Absolute 1.0 0.7 - 3.1 x10E3/uL   Monocytes Absolute 0.4 0.1 - 0.9 x10E3/uL   EOS (ABSOLUTE) 0.2 0.0 - 0.4 x10E3/uL   Basophils Absolute 0.0 0.0 - 0.2 x10E3/uL   Immature Granulocytes 0 Not Estab. %    Immature Grans (Abs) 0.0 0.0 - 0.1 x10E3/uL  Lipid Panel w/o Chol/HDL Ratio   Collection Time: 12/26/23 10:43 AM  Result Value Ref Range   Cholesterol, Total 161 100 - 199 mg/dL   Triglycerides 884 0 - 149 mg/dL   HDL 59 >60 mg/dL   VLDL Cholesterol Cal 20 5 - 40 mg/dL   LDL Chol Calc (NIH) 82 0 - 99 mg/dL      Assessment & Plan:   Problem List Items Addressed This Visit       Cardiovascular and Mediastinum   Senile purpura   Reassured patient.       Relevant Medications   Aspirin 81 MG CAPS   amLODipine  (NORVASC ) 5 MG tablet   atorvastatin  (LIPITOR) 20 MG tablet   metoprolol  succinate (TOPROL -XL) 100 MG 24 hr tablet   valsartan -hydrochlorothiazide  (DIOVAN -HCT) 160-12.5 MG tablet   Other Relevant Orders   CBC with Differential/Platelet   Comprehensive metabolic panel with GFR     Respiratory   Allergic rhinitis   Encouraged her to use her flonase . Call with any issues.       Relevant Medications   fluticasone  (FLONASE ) 50 MCG/ACT nasal spray   Other Relevant Orders   CBC with Differential/Platelet   Comprehensive metabolic panel with GFR     Genitourinary   Benign hypertensive renal disease   Under good control on current regimen. Continue current regimen. Continue to monitor. Call with any concerns. Refills given. Labs drawn today.       Relevant Medications   amLODipine  (NORVASC ) 5 MG tablet   metoprolol  succinate (TOPROL -XL) 100 MG 24 hr tablet   Other Relevant Orders   CBC with Differential/Platelet   Comprehensive metabolic panel with GFR   TSH   Microalbumin, Urine Waived  Other   Major depression in remission   Acting up a bit. Doesn't want a preventative medicine right now. Continue to monitor. Call with any concerns.       Relevant Medications   hydrOXYzine  (VISTARIL ) 25 MG capsule   Other Relevant Orders   CBC with Differential/Platelet   Comprehensive metabolic panel with GFR   Hypercholesteremia   Under good control on current regimen.  Continue current regimen. Continue to monitor. Call with any concerns. Refills given. Labs drawn today.        Relevant Medications   Aspirin 81 MG CAPS   amLODipine  (NORVASC ) 5 MG tablet   atorvastatin  (LIPITOR) 20 MG tablet   metoprolol  succinate (TOPROL -XL) 100 MG 24 hr tablet   valsartan -hydrochlorothiazide  (DIOVAN -HCT) 160-12.5 MG tablet   Other Relevant Orders   CBC with Differential/Platelet   Comprehensive metabolic panel with GFR   Lipid Panel w/o Chol/HDL Ratio   Morbid obesity (HCC)   Congratulated patient on 9lb weight loss! Continue diet and exercise. Call with any concerns.       Relevant Orders   CBC with Differential/Platelet   Comprehensive metabolic panel with GFR   Bayer DCA Hb A1c Waived   Avitaminosis D   Rechecking labs today. Await results. Treat as needed.       Relevant Orders   CBC with Differential/Platelet   Comprehensive metabolic panel with GFR   VITAMIN D  25 Hydroxy (Vit-D Deficiency, Fractures)   Other Visit Diagnoses       Routine general medical examination at a health care facility    -  Primary   Vaccines updated. Screening labs checked today. Mammo, DEXA and cologuard up to date. Continue diet and exercise. Call with any concerns.     Dizziness       Will start epley's manuver and treat allergies. Already in PT. Call with any concerns or if worsening.     Need for COVID-19 vaccine       COVID shot given today.   Relevant Orders   Pfizer Comirnaty Covid -19 Vaccine 95yrs and older     Needs flu shot       Flu shot given today.        Follow up plan: Return in about 6 months (around 12/28/2024).   LABORATORY TESTING:  - Pap smear: not applicable  IMMUNIZATIONS:   - Tdap: Tetanus vaccination status reviewed: last tetanus booster within 10 years. - Influenza: Administered today - Pneumovax: Up to date - Prevnar: Up to date - COVID: Administered today - HPV: Not applicable - Shingrix vaccine: Up to  date  SCREENING: -Mammogram: Up to date  - Colonoscopy: Up to date  - Bone Density: Up to date   PATIENT COUNSELING:   Advised to take 1 mg of folate supplement per day if capable of pregnancy.   Sexuality: Discussed sexually transmitted diseases, partner selection, use of condoms, avoidance of unintended pregnancy  and contraceptive alternatives.   Advised to avoid cigarette smoking.  I discussed with the patient that most people either abstain from alcohol or drink within safe limits (<=14/week and <=4 drinks/occasion for males, <=7/weeks and <= 3 drinks/occasion for females) and that the risk for alcohol disorders and other health effects rises proportionally with the number of drinks per week and how often a drinker exceeds daily limits.  Discussed cessation/primary prevention of drug use and availability of treatment for abuse.   Diet: Encouraged to adjust caloric intake to maintain  or achieve ideal body weight, to  reduce intake of dietary saturated fat and total fat, to limit sodium intake by avoiding high sodium foods and not adding table salt, and to maintain adequate dietary potassium and calcium  preferably from fresh fruits, vegetables, and low-fat dairy products.    stressed the importance of regular exercise  Injury prevention: Discussed safety belts, safety helmets, smoke detector, smoking near bedding or upholstery.   Dental health: Discussed importance of regular tooth brushing, flossing, and dental visits.    NEXT PREVENTATIVE PHYSICAL DUE IN 1 YEAR. Return in about 6 months (around 12/28/2024).

## 2024-06-29 NOTE — Assessment & Plan Note (Signed)
 Encouraged her to use her flonase . Call with any issues.

## 2024-06-29 NOTE — Assessment & Plan Note (Signed)
 Under good control on current regimen. Continue current regimen. Continue to monitor. Call with any concerns. Refills given. Labs drawn today.

## 2024-06-29 NOTE — Assessment & Plan Note (Signed)
 Acting up a bit. Doesn't want a preventative medicine right now. Continue to monitor. Call with any concerns.

## 2024-06-29 NOTE — Assessment & Plan Note (Signed)
 Congratulated patient on 9lb weight loss! Continue diet and exercise. Call with any concerns.

## 2024-06-29 NOTE — Assessment & Plan Note (Signed)
 Rechecking labs today. Await results. Treat as needed.

## 2024-06-29 NOTE — Assessment & Plan Note (Signed)
 Reassured patient.

## 2024-06-30 LAB — CBC WITH DIFFERENTIAL/PLATELET
Basophils Absolute: 0 x10E3/uL (ref 0.0–0.2)
Basos: 0 %
EOS (ABSOLUTE): 0.3 x10E3/uL (ref 0.0–0.4)
Eos: 3 %
Hematocrit: 44.5 % (ref 34.0–46.6)
Hemoglobin: 14.4 g/dL (ref 11.1–15.9)
Immature Grans (Abs): 0 x10E3/uL (ref 0.0–0.1)
Immature Granulocytes: 0 %
Lymphocytes Absolute: 1.4 x10E3/uL (ref 0.7–3.1)
Lymphs: 15 %
MCH: 31.4 pg (ref 26.6–33.0)
MCHC: 32.4 g/dL (ref 31.5–35.7)
MCV: 97 fL (ref 79–97)
Monocytes Absolute: 0.6 x10E3/uL (ref 0.1–0.9)
Monocytes: 6 %
Neutrophils Absolute: 7 x10E3/uL (ref 1.4–7.0)
Neutrophils: 76 %
Platelets: 241 x10E3/uL (ref 150–450)
RBC: 4.59 x10E6/uL (ref 3.77–5.28)
RDW: 12.1 % (ref 11.7–15.4)
WBC: 9.4 x10E3/uL (ref 3.4–10.8)

## 2024-06-30 LAB — COMPREHENSIVE METABOLIC PANEL WITH GFR
ALT: 19 IU/L (ref 0–32)
AST: 24 IU/L (ref 0–40)
Albumin: 4.7 g/dL (ref 3.8–4.8)
Alkaline Phosphatase: 110 IU/L (ref 49–135)
BUN/Creatinine Ratio: 26 (ref 12–28)
BUN: 19 mg/dL (ref 8–27)
Bilirubin Total: 0.5 mg/dL (ref 0.0–1.2)
CO2: 25 mmol/L (ref 20–29)
Calcium: 11.3 mg/dL — ABNORMAL HIGH (ref 8.7–10.3)
Chloride: 99 mmol/L (ref 96–106)
Creatinine, Ser: 0.74 mg/dL (ref 0.57–1.00)
Globulin, Total: 2.8 g/dL (ref 1.5–4.5)
Glucose: 97 mg/dL (ref 70–99)
Potassium: 4.4 mmol/L (ref 3.5–5.2)
Sodium: 139 mmol/L (ref 134–144)
Total Protein: 7.5 g/dL (ref 6.0–8.5)
eGFR: 85 mL/min/1.73 (ref >59)

## 2024-06-30 LAB — LIPID PANEL W/O CHOL/HDL RATIO
Cholesterol, Total: 181 mg/dL (ref 100–199)
HDL: 56 mg/dL (ref >39)
LDL Chol Calc (NIH): 101 mg/dL — ABNORMAL HIGH (ref 0–99)
Triglycerides: 134 mg/dL (ref 0–149)
VLDL Cholesterol Cal: 24 mg/dL (ref 5–40)

## 2024-06-30 LAB — VITAMIN D 25 HYDROXY (VIT D DEFICIENCY, FRACTURES): Vit D, 25-Hydroxy: 30.9 ng/mL (ref 30.0–100.0)

## 2024-06-30 LAB — TSH: TSH: 0.55 u[IU]/mL (ref 0.450–4.500)

## 2024-07-01 ENCOUNTER — Ambulatory Visit: Payer: Self-pay | Admitting: Family Medicine

## 2024-07-03 DIAGNOSIS — M25562 Pain in left knee: Secondary | ICD-10-CM | POA: Diagnosis not present

## 2024-07-05 DIAGNOSIS — M25562 Pain in left knee: Secondary | ICD-10-CM | POA: Diagnosis not present

## 2024-07-10 DIAGNOSIS — M25562 Pain in left knee: Secondary | ICD-10-CM | POA: Diagnosis not present

## 2024-09-04 ENCOUNTER — Ambulatory Visit: Admitting: Nurse Practitioner

## 2024-09-05 ENCOUNTER — Ambulatory Visit (INDEPENDENT_AMBULATORY_CARE_PROVIDER_SITE_OTHER): Admitting: Family Medicine

## 2024-09-05 ENCOUNTER — Encounter: Payer: Self-pay | Admitting: Family Medicine

## 2024-09-05 VITALS — BP 136/80 | HR 61 | Temp 98.0°F | Resp 18 | Ht 60.98 in | Wt 195.0 lb

## 2024-09-05 DIAGNOSIS — N3001 Acute cystitis with hematuria: Secondary | ICD-10-CM | POA: Diagnosis not present

## 2024-09-05 DIAGNOSIS — R300 Dysuria: Secondary | ICD-10-CM

## 2024-09-05 LAB — URINALYSIS, ROUTINE W REFLEX MICROSCOPIC
Bilirubin, UA: NEGATIVE
Glucose, UA: NEGATIVE
Ketones, UA: NEGATIVE
Nitrite, UA: NEGATIVE
Specific Gravity, UA: 1.02 (ref 1.005–1.030)
Urobilinogen, Ur: 0.2 mg/dL (ref 0.2–1.0)
pH, UA: 7 (ref 5.0–7.5)

## 2024-09-05 LAB — MICROSCOPIC EXAMINATION: Bacteria, UA: NONE SEEN

## 2024-09-05 LAB — WET PREP FOR TRICH, YEAST, CLUE
Clue Cell Exam: NEGATIVE
Trichomonas Exam: NEGATIVE
Yeast Exam: NEGATIVE

## 2024-09-05 MED ORDER — CIPROFLOXACIN HCL 500 MG PO TABS: 500.0000 mg | ORAL_TABLET | Freq: Two times a day (BID) | ORAL | 0 refills | Status: AC

## 2024-09-05 NOTE — Progress Notes (Signed)
 "  BP 136/80 (BP Location: Left Arm, Patient Position: Sitting, Cuff Size: Normal)   Pulse 61   Temp 98 F (36.7 C) (Oral)   Resp 18   Ht 5' 0.98 (1.549 m)   Wt 195 lb (88.5 kg)   LMP  (LMP Unknown)   SpO2 97%   BMI 36.86 kg/m    Subjective:    Patient ID: Anne Bell, female    DOB: [birth year], 73 y.o.   MRN: [id number]  HPI: Anne Bell is a 73 y.o. female  Chief Complaint  Patient presents with   Urinary Tract Infection    Possible UTI was told to come in   URINARY SYMPTOMS Duration: 1 week Dysuria: yes Urinary frequency: yes Urgency: yes Small volume voids: yes Symptom severity: moderate Urinary incontinence: no Foul odor: yes Hematuria: yes Abdominal pain: yes Back pain: no Suprapubic pain/pressure: yes Flank pain: no Fever:  no Vomiting: no Relief with cranberry juice: no Relief with pyridium: no Status: worse Previous urinary tract infection: yes Recurrent urinary tract infection: no Sexual activity: monogomous History of sexually transmitted disease: no Vaginal discharge: no Treatments attempted: increasing fluids   Relevant past medical, surgical, family and social history reviewed and updated as indicated. Interim medical history since our last visit reviewed. Allergies and medications reviewed and updated.  Review of Systems  Constitutional: Negative.   Respiratory: Negative.    Cardiovascular: Negative.   Genitourinary:  Positive for dysuria, frequency and urgency. Negative for decreased urine volume, difficulty urinating, dyspareunia, enuresis, flank pain, genital sores, hematuria, menstrual problem, pelvic pain, vaginal bleeding, vaginal discharge and vaginal pain.  Musculoskeletal: Negative.   Psychiatric/Behavioral: Negative.      Per HPI unless specifically indicated above     Objective:    BP 136/80 (BP Location: Left Arm, Patient Position: Sitting, Cuff Size: Normal)   Pulse 61   Temp 98 F (36.7 C) (Oral)   Resp 18   Ht  5' 0.98 (1.549 m)   Wt 195 lb (88.5 kg)   LMP  (LMP Unknown)   SpO2 97%   BMI 36.86 kg/m   Wt Readings from Last 3 Encounters:  09/05/24 195 lb (88.5 kg)  06/29/24 191 lb 3.2 oz (86.7 kg)  03/15/24 200 lb (90.7 kg)    Physical Exam Vitals and nursing note reviewed.  Constitutional:      General: She is not in acute distress.    Appearance: Normal appearance. She is not ill-appearing, toxic-appearing or diaphoretic.  HENT:     Head: Normocephalic and atraumatic.     Right Ear: External ear normal.     Left Ear: External ear normal.     Nose: Nose normal.     Mouth/Throat:     Mouth: Mucous membranes are moist.     Pharynx: Oropharynx is clear.  Eyes:     General: No scleral icterus.       Right eye: No discharge.        Left eye: No discharge.     Extraocular Movements: Extraocular movements intact.     Conjunctiva/sclera: Conjunctivae normal.     Pupils: Pupils are equal, round, and reactive to light.  Cardiovascular:     Rate and Rhythm: Normal rate and regular rhythm.     Pulses: Normal pulses.     Heart sounds: Normal heart sounds. No murmur heard.    No friction rub. No gallop.  Pulmonary:     Effort: Pulmonary effort is normal. No respiratory distress.  Breath sounds: Normal breath sounds. No stridor. No wheezing, rhonchi or rales.  Chest:     Chest wall: No tenderness.  Musculoskeletal:        General: Normal range of motion.     Cervical back: Normal range of motion and neck supple.  Skin:    General: Skin is warm and dry.     Capillary Refill: Capillary refill takes less than 2 seconds.     Coloration: Skin is not jaundiced or pale.     Findings: No bruising, erythema, lesion or rash.  Neurological:     General: No focal deficit present.     Mental Status: She is alert and oriented to person, place, and time. Mental status is at baseline.  Psychiatric:        Mood and Affect: Mood normal.        Behavior: Behavior normal.        Thought Content:  Thought content normal.        Judgment: Judgment normal.     Results for orders placed or performed in visit on 06/29/24  Microalbumin, Urine Waived   Collection Time: 06/29/24 10:04 AM  Result Value Ref Range   Microalb, Ur Waived 80 (H) 0 - 19 mg/L   Creatinine, Urine Waived 100 10 - 300 mg/dL   Microalb/Creat Ratio 30-300 (H) <30 mg/g  Bayer DCA Hb A1c Waived   Collection Time: 06/29/24 10:04 AM  Result Value Ref Range   HB A1C (BAYER DCA - WAIVED) 4.7 (L) 4.8 - 5.6 %  CBC with Differential/Platelet   Collection Time: 06/29/24 10:05 AM  Result Value Ref Range   WBC 9.4 3.4 - 10.8 x10E3/uL   RBC 4.59 3.77 - 5.28 x10E6/uL   Hemoglobin 14.4 11.1 - 15.9 g/dL   Hematocrit 55.4 65.9 - 46.6 %   MCV 97 79 - 97 fL   MCH 31.4 26.6 - 33.0 pg   MCHC 32.4 31.5 - 35.7 g/dL   RDW 87.8 88.2 - 84.5 %   Platelets 241 150 - 450 x10E3/uL   Neutrophils 76 Not Estab. %   Lymphs 15 Not Estab. %   Monocytes 6 Not Estab. %   Eos 3 Not Estab. %   Basos 0 Not Estab. %   Neutrophils Absolute 7.0 1.4 - 7.0 x10E3/uL   Lymphocytes Absolute 1.4 0.7 - 3.1 x10E3/uL   Monocytes Absolute 0.6 0.1 - 0.9 x10E3/uL   EOS (ABSOLUTE) 0.3 0.0 - 0.4 x10E3/uL   Basophils Absolute 0.0 0.0 - 0.2 x10E3/uL   Immature Granulocytes 0 Not Estab. %   Immature Grans (Abs) 0.0 0.0 - 0.1 x10E3/uL  Comprehensive metabolic panel with GFR   Collection Time: 06/29/24 10:05 AM  Result Value Ref Range   Glucose 97 70 - 99 mg/dL   BUN 19 8 - 27 mg/dL   Creatinine, Ser 9.25 0.57 - 1.00 mg/dL   eGFR 85 >40 fO/fpw/8.26   BUN/Creatinine Ratio 26 12 - 28   Sodium 139 134 - 144 mmol/L   Potassium 4.4 3.5 - 5.2 mmol/L   Chloride 99 96 - 106 mmol/L   CO2 25 20 - 29 mmol/L   Calcium  11.3 (H) 8.7 - 10.3 mg/dL   Total Protein 7.5 6.0 - 8.5 g/dL   Albumin 4.7 3.8 - 4.8 g/dL   Globulin, Total 2.8 1.5 - 4.5 g/dL   Bilirubin Total 0.5 0.0 - 1.2 mg/dL   Alkaline Phosphatase 110 49 - 135 IU/L   AST 24 0 - 40  IU/L   ALT 19 0 - 32 IU/L   Lipid Panel w/o Chol/HDL Ratio   Collection Time: 06/29/24 10:05 AM  Result Value Ref Range   Cholesterol, Total 181 100 - 199 mg/dL   Triglycerides 865 0 - 149 mg/dL   HDL 56 >60 mg/dL   VLDL Cholesterol Cal 24 5 - 40 mg/dL   LDL Chol Calc (NIH) 898 (H) 0 - 99 mg/dL  TSH   Collection Time: 06/29/24 10:05 AM  Result Value Ref Range   TSH 0.550 0.450 - 4.500 uIU/mL  VITAMIN D  25 Hydroxy (Vit-D Deficiency, Fractures)   Collection Time: 06/29/24 10:05 AM  Result Value Ref Range   Vit D, 25-Hydroxy 30.9 30.0 - 100.0 ng/mL      Assessment & Plan:   Problem List Items Addressed This Visit   None Visit Diagnoses       Acute cystitis with hematuria    -  Primary   Will treat with cipro  and send for culture. Wet prep negative. Call with any concerns. Continue to monitor.   Relevant Orders   Urine Culture     Dysuria       Relevant Orders   WET PREP FOR TRICH, YEAST, CLUE   Urinalysis, Routine w reflex microscopic        Follow up plan: Return if symptoms worsen or fail to improve.      "

## 2024-09-07 LAB — URINE CULTURE

## 2024-09-24 ENCOUNTER — Telehealth: Payer: Self-pay

## 2024-09-24 NOTE — Telephone Encounter (Signed)
 appt

## 2024-09-24 NOTE — Telephone Encounter (Signed)
 Copied from CRM [id number]. Topic: Clinical - Medication Question >> Sep 24, 2024 11:04 AM Avram MATSU wrote: Reason for CRM: patient stated the medication needs to be stronger, she still gets up in the middle of the night 5-6 times. If possible Please advise [phone]   oxybutynin  (DITROPAN -XL) 10 MG 24 hr tablet [[id number]]  [facility] [address] [facility] - [place], [place] - [address] AT [facility] [address] [place] [address] Phone: [phone] Fax: [phone]

## 2024-09-24 NOTE — Telephone Encounter (Signed)
 Can the Oxybutinin be increased for the patient?

## 2024-09-24 NOTE — Telephone Encounter (Signed)
 Scheduled.

## 2024-11-06 ENCOUNTER — Ambulatory Visit: Admitting: Family Medicine

## 2024-12-28 ENCOUNTER — Ambulatory Visit: Admitting: Family Medicine

## 2025-03-28 ENCOUNTER — Ambulatory Visit
